# Patient Record
Sex: Male | Born: 1950 | Race: Black or African American | Hispanic: No | Marital: Single | Smoking: Light tobacco smoker
Health system: Southern US, Community
[De-identification: ages and names within clinical notes are randomized; demographics above are authoritative.]

## PROBLEM LIST (undated history)

## (undated) DIAGNOSIS — B1920 Unspecified viral hepatitis C without hepatic coma: Secondary | ICD-10-CM

## (undated) DIAGNOSIS — G8250 Quadriplegia, unspecified: Secondary | ICD-10-CM

## (undated) DIAGNOSIS — I499 Cardiac arrhythmia, unspecified: Secondary | ICD-10-CM

## (undated) DIAGNOSIS — I4892 Unspecified atrial flutter: Secondary | ICD-10-CM

## (undated) DIAGNOSIS — I10 Essential (primary) hypertension: Secondary | ICD-10-CM

## (undated) DIAGNOSIS — R569 Unspecified convulsions: Secondary | ICD-10-CM

## (undated) DIAGNOSIS — I509 Heart failure, unspecified: Secondary | ICD-10-CM

## (undated) DIAGNOSIS — F10239 Alcohol dependence with withdrawal, unspecified: Secondary | ICD-10-CM

## (undated) DIAGNOSIS — Z9114 Patient's other noncompliance with medication regimen: Secondary | ICD-10-CM

## (undated) DIAGNOSIS — F1010 Alcohol abuse, uncomplicated: Secondary | ICD-10-CM

## (undated) HISTORY — DX: Patient's other noncompliance with medication regimen: Z91.14

## (undated) HISTORY — DX: Unspecified atrial flutter: I48.92

## (undated) HISTORY — DX: Essential (primary) hypertension: I10

## (undated) HISTORY — PX: OTHER SURGICAL HISTORY: SHX169

## (undated) HISTORY — DX: Unspecified viral hepatitis C without hepatic coma: B19.20

## (undated) HISTORY — DX: Unspecified convulsions: R56.9

## (undated) HISTORY — DX: Alcohol dependence with withdrawal, unspecified: F10.239

---

## 1998-03-30 ENCOUNTER — Emergency Department (HOSPITAL_COMMUNITY): Admission: EM | Admit: 1998-03-30 | Discharge: 1998-03-30 | Payer: Self-pay | Admitting: Emergency Medicine

## 1998-05-17 ENCOUNTER — Emergency Department (HOSPITAL_COMMUNITY): Admission: EM | Admit: 1998-05-17 | Discharge: 1998-05-17 | Payer: Self-pay | Admitting: Emergency Medicine

## 1998-11-01 ENCOUNTER — Emergency Department (HOSPITAL_COMMUNITY): Admission: EM | Admit: 1998-11-01 | Discharge: 1998-11-01 | Payer: Self-pay | Admitting: Emergency Medicine

## 2001-04-01 ENCOUNTER — Emergency Department (HOSPITAL_COMMUNITY): Admission: EM | Admit: 2001-04-01 | Discharge: 2001-04-02 | Payer: Self-pay | Admitting: *Deleted

## 2001-04-01 ENCOUNTER — Encounter: Payer: Self-pay | Admitting: *Deleted

## 2001-04-02 ENCOUNTER — Encounter: Payer: Self-pay | Admitting: *Deleted

## 2001-07-16 ENCOUNTER — Encounter: Payer: Self-pay | Admitting: Emergency Medicine

## 2001-07-16 ENCOUNTER — Emergency Department (HOSPITAL_COMMUNITY): Admission: EM | Admit: 2001-07-16 | Discharge: 2001-07-16 | Payer: Self-pay | Admitting: Emergency Medicine

## 2002-02-18 ENCOUNTER — Emergency Department (HOSPITAL_COMMUNITY): Admission: EM | Admit: 2002-02-18 | Discharge: 2002-02-18 | Payer: Self-pay | Admitting: Emergency Medicine

## 2002-02-27 ENCOUNTER — Emergency Department (HOSPITAL_COMMUNITY): Admission: EM | Admit: 2002-02-27 | Discharge: 2002-02-27 | Payer: Self-pay | Admitting: Emergency Medicine

## 2002-12-09 ENCOUNTER — Emergency Department (HOSPITAL_COMMUNITY): Admission: EM | Admit: 2002-12-09 | Discharge: 2002-12-09 | Payer: Self-pay | Admitting: Emergency Medicine

## 2003-08-01 ENCOUNTER — Emergency Department (HOSPITAL_COMMUNITY): Admission: EM | Admit: 2003-08-01 | Discharge: 2003-08-01 | Payer: Self-pay | Admitting: Emergency Medicine

## 2003-08-01 ENCOUNTER — Encounter: Payer: Self-pay | Admitting: Emergency Medicine

## 2005-02-28 ENCOUNTER — Emergency Department (HOSPITAL_COMMUNITY): Admission: EM | Admit: 2005-02-28 | Discharge: 2005-03-01 | Payer: Self-pay | Admitting: Emergency Medicine

## 2007-01-23 ENCOUNTER — Emergency Department (HOSPITAL_COMMUNITY): Admission: EM | Admit: 2007-01-23 | Discharge: 2007-01-24 | Payer: Self-pay | Admitting: Emergency Medicine

## 2007-01-27 ENCOUNTER — Ambulatory Visit: Payer: Self-pay | Admitting: *Deleted

## 2007-09-16 ENCOUNTER — Inpatient Hospital Stay (HOSPITAL_COMMUNITY): Admission: EM | Admit: 2007-09-16 | Discharge: 2007-09-21 | Payer: Self-pay | Admitting: Emergency Medicine

## 2007-09-16 ENCOUNTER — Ambulatory Visit: Payer: Self-pay | Admitting: Internal Medicine

## 2008-06-15 ENCOUNTER — Emergency Department (HOSPITAL_COMMUNITY): Admission: EM | Admit: 2008-06-15 | Discharge: 2008-06-15 | Payer: Self-pay | Admitting: Emergency Medicine

## 2008-11-16 ENCOUNTER — Inpatient Hospital Stay (HOSPITAL_COMMUNITY): Admission: AC | Admit: 2008-11-16 | Discharge: 2008-11-18 | Payer: Self-pay

## 2008-11-25 ENCOUNTER — Ambulatory Visit: Payer: Self-pay | Admitting: Psychiatry

## 2008-11-25 ENCOUNTER — Inpatient Hospital Stay (HOSPITAL_COMMUNITY): Admission: EM | Admit: 2008-11-25 | Discharge: 2008-12-02 | Payer: Self-pay | Admitting: Emergency Medicine

## 2008-11-27 ENCOUNTER — Encounter (INDEPENDENT_AMBULATORY_CARE_PROVIDER_SITE_OTHER): Payer: Self-pay | Admitting: Internal Medicine

## 2009-02-27 ENCOUNTER — Emergency Department (HOSPITAL_COMMUNITY): Admission: EM | Admit: 2009-02-27 | Discharge: 2009-02-28 | Payer: Self-pay | Admitting: Emergency Medicine

## 2009-06-14 ENCOUNTER — Emergency Department (HOSPITAL_COMMUNITY): Admission: EM | Admit: 2009-06-14 | Discharge: 2009-06-14 | Payer: Self-pay | Admitting: Emergency Medicine

## 2009-07-12 ENCOUNTER — Emergency Department (HOSPITAL_COMMUNITY): Admission: EM | Admit: 2009-07-12 | Discharge: 2009-07-12 | Payer: Self-pay | Admitting: Emergency Medicine

## 2010-02-06 ENCOUNTER — Emergency Department (HOSPITAL_COMMUNITY): Admission: EM | Admit: 2010-02-06 | Discharge: 2010-02-06 | Payer: Self-pay | Admitting: Emergency Medicine

## 2010-10-24 ENCOUNTER — Emergency Department (HOSPITAL_COMMUNITY): Admission: EM | Admit: 2010-10-24 | Discharge: 2010-10-24 | Payer: Self-pay | Admitting: Emergency Medicine

## 2010-10-30 ENCOUNTER — Emergency Department (HOSPITAL_COMMUNITY): Admission: EM | Admit: 2010-10-30 | Discharge: 2010-10-30 | Payer: Self-pay | Admitting: Emergency Medicine

## 2011-03-22 LAB — COMPREHENSIVE METABOLIC PANEL
ALT: 154 U/L — ABNORMAL HIGH (ref 0–53)
AST: 280 U/L — ABNORMAL HIGH (ref 0–37)
Albumin: 4.1 g/dL (ref 3.5–5.2)
Alkaline Phosphatase: 89 U/L (ref 39–117)
Chloride: 104 mEq/L (ref 96–112)
GFR calc Af Amer: >60 mL/min (ref >60)
Potassium: 4.5 mEq/L (ref 3.5–5.1)
Total Bilirubin: 0.8 mg/dL (ref 0.3–1.2)

## 2011-03-22 LAB — POCT I-STAT, CHEM 8
Calcium, Ion: 1.07 mmol/L — ABNORMAL LOW (ref 1.12–1.32)
HCT: 47 % (ref 39.0–52.0)
Hemoglobin: 16 g/dL (ref 13.0–17.0)
Sodium: 144 mEq/L (ref 135–145)
TCO2: 21 mmol/L (ref 0–100)

## 2011-03-22 LAB — CBC
HCT: 43.3 % (ref 39.0–52.0)
Platelets: 205 10*3/uL (ref 150–400)
WBC: 7.3 10*3/uL (ref 4.0–10.5)

## 2011-03-22 LAB — DIFFERENTIAL
Band Neutrophils: 0 % (ref 0–10)
Basophils Absolute: 0 10*3/uL (ref 0.0–0.1)
Basophils Relative: 0 % (ref 0–1)
Lymphs Abs: 2.6 10*3/uL (ref 0.7–4.0)
Myelocytes: 0 %
Promyelocytes Absolute: 0 %

## 2011-03-22 LAB — ETHANOL
Alcohol, Ethyl (B): 287 mg/dL — ABNORMAL HIGH (ref 0–10)
Alcohol, Ethyl (B): 372 mg/dL — ABNORMAL HIGH (ref 0–10)

## 2011-03-22 LAB — GLUCOSE, CAPILLARY: Glucose-Capillary: 78 mg/dL (ref 70–99)

## 2011-03-26 LAB — BASIC METABOLIC PANEL
Chloride: 109 mEq/L (ref 96–112)
Creatinine, Ser: 1.02 mg/dL (ref 0.4–1.5)
GFR calc Af Amer: >60 mL/min (ref >60)
GFR calc non Af Amer: >60 mL/min (ref >60)

## 2011-03-26 LAB — DIFFERENTIAL
Eosinophils Absolute: 0.1 10*3/uL (ref 0.0–0.7)
Eosinophils Relative: 1 % (ref 0–5)
Lymphocytes Relative: 70 % — ABNORMAL HIGH (ref 12–46)
Lymphs Abs: 5 10*3/uL — ABNORMAL HIGH (ref 0.7–4.0)
Metamyelocytes Relative: 0 %
Monocytes Absolute: 0.3 10*3/uL (ref 0.1–1.0)
Monocytes Relative: 4 % (ref 3–12)
Neutro Abs: 1.8 10*3/uL (ref 1.7–7.7)
Neutrophils Relative %: 25 % — ABNORMAL LOW (ref 43–77)
nRBC: 0 /100 WBC

## 2011-03-26 LAB — ETHANOL: Alcohol, Ethyl (B): 458 mg/dL (ref 0–10)

## 2011-03-26 LAB — CBC
MCV: 78.8 fL (ref 78.0–100.0)
RBC: 5.53 MIL/uL (ref 4.22–5.81)
WBC: 7.2 10*3/uL (ref 4.0–10.5)

## 2011-03-26 LAB — RAPID URINE DRUG SCREEN, HOSP PERFORMED
Barbiturates: NOT DETECTED
Opiates: NOT DETECTED
Tetrahydrocannabinol: NOT DETECTED
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
## 2011-04-28 NOTE — Discharge Summary (Signed)
Anthony Mcclure, Anthony Mcclure                ACCOUNT NO.:  [id number]   MEDICAL RECORD NO.:  [id number]          PATIENT TYPE:  INP   LOCATION:  4731                         FACILITY:  [facility]   PHYSICIAN:  Richarda Overlie, MD       DATE OF BIRTH:  Jun 09, 1951   DATE OF ADMISSION:  11/25/2008  DATE OF DISCHARGE:  11/30/2008                               DISCHARGE SUMMARY   DISCHARGE DIAGNOSES:  1. New onset atrial flutter secondary to alcohol intoxication.  2. Hypertension.  3. Alcohol dependence.  4. History of tobacco abuse.  5. Delirium not otherwise specified.   SUBJECTIVE:  This is a 60 year old male admitted to [facility]  on December 13 with history of atrial fibrillation and alcohol  intoxication.  The patient was found to have a bizarre behavior and  impaired judgment.  Initially the patient was found to be tachycardic  with an EKG showing atrial flutter at a rate of 145 beats per minute.  His serial troponins remained negative.  Chest x-ray revealed  cardiomegaly with vascular congestion with no focal infiltrates.  CT  scan of the head revealed no acute disease.  CT scan of the cervical  spine revealed multilevel degenerative disk disease.  The patient's  atrial flutter new onset atrial flutter was thought to be secondary to  alcohol intoxication.  He was started on treatment with metoprolol.  No  anticoagulation was initiated because of his high fall risk.  The  patient's thyroid function studies were found to be within normal  limits.  His initial alcohol level was 287.  Urine drug screen was found  to be grossly negative.   Alcohol dependence with abnormal liver function thought to be secondary  to alcoholic hepatitis, AST greater than ALT.  He was found to have a  reactive anti-HCV antibody and HCV RIBA was sent which was still pending  at this time.  His hepatitis B and hepatitis A panel was negative.  The  HCV RIBA test needs to be followed closely by his primary care  Anthony Mcclure.   Elevated CK likely secondary to recurrent falls and rhabdomyolysis that  has responded well to IV hydration.   Hypokalemia.  He was found to be hypokalemic and his potassium was  repleted.   Anemia.  Anemia was thought to be due to anemia of chronic disease.  His  TIBC was found to be 237.  Serum iron was found to be 23.  His MCV was  found to be normal.  His anemia remained stable during the course of his  hospitalization.  He was started on a multivitamin and ferrous sulfate  supplementation.   Lack of competence.  Dr. Jeanie Mcclure was consulted who thought that he had  impaired [place]-term recall and impaired judgment with periodic  agitation.  Found to have delirium not otherwise specified, likely made  worse by thiamine deficiency.  Continuation of thiamine supplementation  was strongly recommended.  He was placed on benzodiazepine protocol for  alcohol withdrawal.  However, the patient did not have any signs of  withdrawal and his Ativan was discontinued  prior to discharge.   Because of his impaired judgment he was not allowed to leave against  medical advice and it was recommended to enroll Anthony Mcclure in a small  study group chemical dependence residential treatment program  . Dr  Anthony Mcclure felt that this would only be appropriate once his memory  returns .  Because of his inability to leave AMA as per psychitric assessment and  the fact that now he was medically stable a decision was made to  continue with his involunatry committment , until it was decided after  another Psychiatric assessment that this wasn't needed anymore.   If his involuntary committment is removed tomorrow morning then his DC  plan would be as follows.   Case manager was also to help with establishment of PCP.   DISCHARGE MEDICATIONS:  1. Aspirin 81 mg p.o. daily.  2. Lactulose 30 mL p.o. q.6 hours.  3. Toprol-XL 50 mg p.o. daily.  4. Ambien 1 tablet p.o. daily.  5. Prilosec 40 mg p.o.  daily.  6. K-Phos 250 mg p.o. q.12 hours.  7. Thiamine 100 mg p.o. daily.  8. Restoril 50 mg p.o. q.h.s. p.r.n. insomnia.  9. Maalox 30 mL p.o. q.12 hours p.r.n.  10.Ferrous sulfate 325 mg p.o. q.12 hours.   FOLLOWUP:  Repeat BMET, CT, CMP, prior to PCP visit and to have case  manager to establish PCP visit within 7-10 days.  PCP to follow up for  HCV RIBA test which is still pending.      Richarda Overlie, MD  Electronically Signed     NA/MEDQ  D:  11/30/2008  T:  11/30/2008  Job:  [id number]

## 2011-04-28 NOTE — Consult Note (Signed)
Mcclure, Anthony                ACCOUNT NO.:  [id number]   MEDICAL RECORD NO.:  [id number]          PATIENT TYPE:  INP   LOCATION:  4731                         FACILITY:  [facility]   PHYSICIAN:  Antonietta Breach, M.D.  DATE OF BIRTH:  [birth year]   DATE OF CONSULTATION:  11/29/2008  DATE OF DISCHARGE:                                 CONSULTATION   ADDENDUM:   Anthony Mcclure's decapacitating diagnosis is dementia, not otherwise  specified.  He has the impairments listed above in the first dictation  for December 17.      Antonietta Breach, M.D.  Electronically Signed     JW/MEDQ  D:  11/30/2008  T:  11/30/2008  Job:  [id number]

## 2011-04-28 NOTE — Discharge Summary (Signed)
NAMEHY, SWIATEK                ACCOUNT NO.:  [id number]   MEDICAL RECORD NO.:  [id number]          PATIENT TYPE:  INP   LOCATION:  5120                         FACILITY:  [facility]   PHYSICIAN:  Joylene John, MD       DATE OF BIRTH:  Nov 11, 1951   DATE OF ADMISSION:  11/25/2008  DATE OF DISCHARGE:  12/02/2008                               DISCHARGE SUMMARY   ADDENDUM   Please refer to the discharge summary that was dictated on November 30, 2008, for entire hospital course.   In the interim, the patient was kept in the hospital due to the fact  that he was not thought to have capacity to make decisions; however, he  was seen by a Psychiatry over the weekend and the patient was thought to  be competent to make medical decisions regarding his treatment care.  The patient was also seen by social worker to coordinate an outpatient  followup which was set up, and since the patient was medically stable  and hemodynamically stable, he was deemed stable for discharge.  The  patient was discharged on December 02, 2008.      Joylene John, MD  Electronically Signed     RP/MEDQ  D:  12/02/2008  T:  12/03/2008  Job:  [id number]
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
## 2011-09-10 LAB — CBC
HCT: 43.2
MCHC: 31.9
MCV: 83
Platelets: 180

## 2011-09-10 LAB — DIFFERENTIAL
Basophils Relative: 2 — ABNORMAL HIGH
Eosinophils Absolute: 0.1
Eosinophils Relative: 1
Neutrophils Relative %: 50

## 2011-09-10 LAB — POCT I-STAT, CHEM 8
Glucose, Bld: 80
HCT: 47
Hemoglobin: 16
Potassium: 3.6
Sodium: 140
TCO2: 24

## 2011-09-17 LAB — COMPREHENSIVE METABOLIC PANEL
AST: 167 U/L — ABNORMAL HIGH (ref 0–37)
AST: 243 U/L — ABNORMAL HIGH (ref 0–37)
Albumin: 3 g/dL — ABNORMAL LOW (ref 3.5–5.2)
Alkaline Phosphatase: 68 U/L (ref 39–117)
BUN: 4 mg/dL — ABNORMAL LOW (ref 6–23)
BUN: 5 mg/dL — ABNORMAL LOW (ref 6–23)
CO2: 26 mEq/L (ref 19–32)
CO2: 27 mEq/L (ref 19–32)
Calcium: 8.2 mg/dL — ABNORMAL LOW (ref 8.4–10.5)
Calcium: 8.3 mg/dL — ABNORMAL LOW (ref 8.4–10.5)
Calcium: 8.5 mg/dL (ref 8.4–10.5)
Chloride: 106 mEq/L (ref 96–112)
Creatinine, Ser: 0.85 mg/dL (ref 0.4–1.5)
Creatinine, Ser: 0.89 mg/dL (ref 0.4–1.5)
GFR calc Af Amer: >60 mL/min (ref >60)
GFR calc Af Amer: >60 mL/min (ref >60)
GFR calc non Af Amer: >60 mL/min (ref >60)
GFR calc non Af Amer: >60 mL/min (ref >60)
GFR calc non Af Amer: >60 mL/min (ref >60)
Glucose, Bld: 77 mg/dL (ref 70–99)
Total Bilirubin: 1.2 mg/dL (ref 0.3–1.2)
Total Protein: 5.5 g/dL — ABNORMAL LOW (ref 6.0–8.3)
Total Protein: 5.8 g/dL — ABNORMAL LOW (ref 6.0–8.3)

## 2011-09-17 LAB — HEPATIC FUNCTION PANEL
ALT: 158 U/L — ABNORMAL HIGH (ref 0–53)
AST: 206 U/L — ABNORMAL HIGH (ref 0–37)
AST: 295 U/L — ABNORMAL HIGH (ref 0–37)
AST: 334 U/L — ABNORMAL HIGH (ref 0–37)
Albumin: 2.8 g/dL — ABNORMAL LOW (ref 3.5–5.2)
Bilirubin, Direct: 0.2 mg/dL (ref 0.0–0.3)
Bilirubin, Direct: 0.2 mg/dL (ref 0.0–0.3)
Bilirubin, Direct: 0.2 mg/dL (ref 0.0–0.3)
Indirect Bilirubin: 0.4 mg/dL (ref 0.3–0.9)
Indirect Bilirubin: 0.7 mg/dL (ref 0.3–0.9)
Total Bilirubin: 0.6 mg/dL (ref 0.3–1.2)
Total Protein: 5.3 g/dL — ABNORMAL LOW (ref 6.0–8.3)
Total Protein: 5.3 g/dL — ABNORMAL LOW (ref 6.0–8.3)

## 2011-09-17 LAB — PROTIME-INR
INR: 1 (ref 0.00–1.49)
INR: 1 (ref 0.00–1.49)
Prothrombin Time: 13.5 seconds (ref 11.6–15.2)

## 2011-09-17 LAB — DIFFERENTIAL
Band Neutrophils: 0 % (ref 0–10)
Basophils Absolute: 0 10*3/uL (ref 0.0–0.1)
Basophils Relative: 0 % (ref 0–1)
Eosinophils Absolute: 0 10*3/uL (ref 0.0–0.7)
Eosinophils Relative: 0 % (ref 0–5)
Lymphocytes Relative: 23 % (ref 12–46)
Lymphs Abs: 1.4 10*3/uL (ref 0.7–4.0)
Monocytes Absolute: 0.3 10*3/uL (ref 0.1–1.0)
Neutro Abs: 4.5 10*3/uL (ref 1.7–7.7)
Neutrophils Relative %: 72 % (ref 43–77)
Promyelocytes Absolute: 0 %

## 2011-09-17 LAB — HEPATITIS B CORE ANTIBODY, TOTAL: Hep B Core Total Ab: NEGATIVE

## 2011-09-17 LAB — BASIC METABOLIC PANEL
BUN: 5 mg/dL — ABNORMAL LOW (ref 6–23)
CO2: 26 mEq/L (ref 19–32)
Calcium: 7.9 mg/dL — ABNORMAL LOW (ref 8.4–10.5)
Calcium: 8.2 mg/dL — ABNORMAL LOW (ref 8.4–10.5)
Calcium: 8.4 mg/dL (ref 8.4–10.5)
Chloride: 106 mEq/L (ref 96–112)
GFR calc Af Amer: >60 mL/min (ref >60)
GFR calc Af Amer: >60 mL/min (ref >60)
GFR calc non Af Amer: >60 mL/min (ref >60)
GFR calc non Af Amer: >60 mL/min (ref >60)
Glucose, Bld: 82 mg/dL (ref 70–99)
Glucose, Bld: 92 mg/dL (ref 70–99)
Potassium: 3.4 mEq/L — ABNORMAL LOW (ref 3.5–5.1)
Potassium: 4.1 mEq/L (ref 3.5–5.1)
Sodium: 137 mEq/L (ref 135–145)
Sodium: 137 mEq/L (ref 135–145)
Sodium: 141 mEq/L (ref 135–145)

## 2011-09-17 LAB — CBC
HCT: 28.7 % — ABNORMAL LOW (ref 39.0–52.0)
HCT: 28.8 % — ABNORMAL LOW (ref 39.0–52.0)
HCT: 29 % — ABNORMAL LOW (ref 39.0–52.0)
Hemoglobin: 9.5 g/dL — ABNORMAL LOW (ref 13.0–17.0)
Hemoglobin: 9.6 g/dL — ABNORMAL LOW (ref 13.0–17.0)
MCHC: 31.6 g/dL (ref 30.0–36.0)
MCHC: 33.1 g/dL (ref 30.0–36.0)
MCHC: 33.3 g/dL (ref 30.0–36.0)
MCV: 87.2 fL (ref 78.0–100.0)
MCV: 87.5 fL (ref 78.0–100.0)
Platelets: 208 10*3/uL (ref 150–400)
RBC: 3.33 MIL/uL — ABNORMAL LOW (ref 4.22–5.81)
RBC: 3.34 MIL/uL — ABNORMAL LOW (ref 4.22–5.81)
RBC: 3.37 MIL/uL — ABNORMAL LOW (ref 4.22–5.81)
RDW: 18.6 % — ABNORMAL HIGH (ref 11.5–15.5)
RDW: 19.1 % — ABNORMAL HIGH (ref 11.5–15.5)
RDW: 19.4 % — ABNORMAL HIGH (ref 11.5–15.5)
WBC: 6.6 10*3/uL (ref 4.0–10.5)

## 2011-09-17 LAB — IRON AND TIBC
Iron: 23 ug/dL — ABNORMAL LOW (ref 42–135)
Saturation Ratios: 8 % — ABNORMAL LOW (ref 20–55)
UIBC: 237 ug/dL
UIBC: 254 ug/dL

## 2011-09-17 LAB — HEPATITIS B SURFACE ANTIGEN: Hepatitis B Surface Ag: NEGATIVE

## 2011-09-17 LAB — APTT: aPTT: 34 seconds (ref 24–37)

## 2011-09-17 LAB — AMMONIA: Ammonia: 81 umol/L — ABNORMAL HIGH (ref 11–35)

## 2011-09-17 LAB — CARDIAC PANEL(CRET KIN+CKTOT+MB+TROPI)
Total CK: 554 U/L — ABNORMAL HIGH (ref 7–232)
Troponin I: 0.02 ng/mL (ref 0.00–0.06)

## 2011-09-17 LAB — POCT CARDIAC MARKERS: CKMB, poc: 4.8 ng/mL (ref 1.0–8.0)

## 2011-09-17 LAB — CK TOTAL AND CKMB (NOT AT ARMC)
CK, MB: 7.4 ng/mL — ABNORMAL HIGH (ref 0.3–4.0)
Relative Index: 0.9 (ref 0.0–2.5)
Total CK: 822 U/L — ABNORMAL HIGH (ref 7–232)

## 2011-09-17 LAB — RAPID URINE DRUG SCREEN, HOSP PERFORMED
Cocaine: NOT DETECTED
Opiates: NOT DETECTED

## 2011-09-17 LAB — PHOSPHORUS: Phosphorus: 3.9 mg/dL (ref 2.3–4.6)

## 2011-09-17 LAB — VITAMIN B12: Vitamin B-12: 616 pg/mL (ref 211–911)

## 2011-09-17 LAB — HEPATITIS PANEL, ACUTE
HCV Ab: REACTIVE — AB
Hep B C IgM: NEGATIVE
Hepatitis B Surface Ag: NEGATIVE

## 2011-09-17 LAB — HEPATITIS C ANTIBODY: HCV Ab: REACTIVE — AB

## 2011-09-17 LAB — HCV RNA QUANT RFLX ULTRA OR GENOTYP
HCV Quantitative Log: 5.91 {Log} — ABNORMAL HIGH (ref <1.63)
HCV Quantitative: 806000 IU/mL — ABNORMAL HIGH (ref <43)

## 2011-09-17 LAB — ETHANOL: Alcohol, Ethyl (B): 358 mg/dL — ABNORMAL HIGH (ref 0–10)

## 2011-09-17 LAB — TROPONIN I: Troponin I: 0.05 ng/mL (ref 0.00–0.06)

## 2011-09-17 LAB — ANA: Anti Nuclear Antibody(ANA): NEGATIVE

## 2011-09-17 LAB — MAGNESIUM: Magnesium: 1.5 mg/dL (ref 1.5–2.5)

## 2011-09-17 LAB — TSH: TSH: 1.276 u[IU]/mL (ref 0.350–4.500)

## 2011-09-18 LAB — CBC
HCT: 28 % — ABNORMAL LOW (ref 39.0–52.0)
Hemoglobin: 9.4 g/dL — ABNORMAL LOW (ref 13.0–17.0)
MCHC: 32.8 g/dL (ref 30.0–36.0)
MCHC: 33.6 g/dL (ref 30.0–36.0)
MCV: 84.4 fL (ref 78.0–100.0)
MCV: 85.1 fL (ref 78.0–100.0)
Platelets: 109 K/uL — ABNORMAL LOW (ref 150–400)
Platelets: 158 10*3/uL (ref 150–400)
RBC: 2.92 MIL/uL — ABNORMAL LOW (ref 4.22–5.81)
RBC: 3.29 MIL/uL — ABNORMAL LOW (ref 4.22–5.81)
RDW: 17.3 % — ABNORMAL HIGH (ref 11.5–15.5)
WBC: 6.6 10*3/uL (ref 4.0–10.5)
WBC: 7.8 10*3/uL (ref 4.0–10.5)
WBC: 9.1 K/uL (ref 4.0–10.5)

## 2011-09-18 LAB — TYPE AND SCREEN: Antibody Screen: NEGATIVE

## 2011-09-18 LAB — ABO/RH: ABO/RH(D): O POS

## 2011-09-18 LAB — BASIC METABOLIC PANEL
BUN: 6 mg/dL (ref 6–23)
CO2: 21 mEq/L (ref 19–32)
Calcium: 7.4 mg/dL — ABNORMAL LOW (ref 8.4–10.5)
Creatinine, Ser: 0.79 mg/dL (ref 0.4–1.5)
Glucose, Bld: 84 mg/dL (ref 70–99)

## 2011-09-18 LAB — POCT I-STAT, CHEM 8
Creatinine, Ser: 1.2 mg/dL (ref 0.4–1.5)
Glucose, Bld: 194 mg/dL — ABNORMAL HIGH (ref 70–99)
Hemoglobin: 14.3 g/dL (ref 13.0–17.0)
Sodium: 138 meq/L (ref 135–145)
TCO2: 22 mmol/L (ref 0–100)

## 2011-09-18 LAB — PROTIME-INR
INR: 1 (ref 0.00–1.49)
Prothrombin Time: 13.8 s (ref 11.6–15.2)

## 2011-09-18 LAB — PHENYTOIN LEVEL, TOTAL: Phenytoin Lvl: <2.5 ug/mL — ABNORMAL LOW (ref 10.0–20.0)

## 2011-09-18 LAB — ETHANOL: Alcohol, Ethyl (B): 393 mg/dL — ABNORMAL HIGH (ref 0–10)

## 2011-09-24 LAB — CULTURE, BLOOD (ROUTINE X 2)
Culture: NO GROWTH
Culture: NO GROWTH

## 2011-09-24 LAB — DIFFERENTIAL
Basophils Absolute: 0
Eosinophils Relative: 5
Lymphocytes Relative: 32
Neutrophils Relative %: 58

## 2011-09-24 LAB — I-STAT 8, (EC8 V) (CONVERTED LAB)
Acid-base deficit: 2
BUN: 8
Chloride: 107
HCT: 48
Hemoglobin: 16.3
Operator id: 277751
Potassium: 3.8
Sodium: 144

## 2011-09-24 LAB — RAPID URINE DRUG SCREEN, HOSP PERFORMED
Benzodiazepines: POSITIVE — AB
Cocaine: NOT DETECTED
Tetrahydrocannabinol: NOT DETECTED

## 2011-09-24 LAB — BASIC METABOLIC PANEL
BUN: 1 — ABNORMAL LOW
BUN: 3 — ABNORMAL LOW
BUN: 7
CO2: 26
CO2: 27
Calcium: 8.4
Calcium: 8.9
Chloride: 105
Chloride: 105
Chloride: 106
Creatinine, Ser: 0.88
Creatinine, Ser: 0.92
GFR calc non Af Amer: >60
GFR calc non Af Amer: >60
Glucose, Bld: 105 — ABNORMAL HIGH
Glucose, Bld: 77
Glucose, Bld: 95
Glucose, Bld: 99
Potassium: 3.5
Potassium: 3.5
Potassium: 3.7
Potassium: 3.7
Sodium: 138
Sodium: 139

## 2011-09-24 LAB — URINALYSIS, ROUTINE W REFLEX MICROSCOPIC
Nitrite: NEGATIVE
Specific Gravity, Urine: 1.019
Urobilinogen, UA: 0.2
pH: 5.5

## 2011-09-24 LAB — COMPREHENSIVE METABOLIC PANEL
AST: 96 — ABNORMAL HIGH
Albumin: 3.3 — ABNORMAL LOW
Calcium: 8.8
Creatinine, Ser: 0.85
GFR calc Af Amer: >60
GFR calc non Af Amer: >60
Total Protein: 6.6

## 2011-09-24 LAB — ETHANOL
Alcohol, Ethyl (B): 442
Alcohol, Ethyl (B): 570
Alcohol, Ethyl (B): <5

## 2011-09-24 LAB — CBC
HCT: 42.9
MCHC: 33.2
MCV: 81.3
Platelets: 264
Platelets: 304
RDW: 17.7 — ABNORMAL HIGH
RDW: 17.9 — ABNORMAL HIGH

## 2011-09-24 LAB — MAGNESIUM: Magnesium: 1.5

## 2011-09-24 LAB — URINE MICROSCOPIC-ADD ON

## 2011-09-24 LAB — POCT I-STAT CREATININE: Operator id: 277751

## 2011-09-24 LAB — AMMONIA: Ammonia: 48 — ABNORMAL HIGH

## 2012-02-01 ENCOUNTER — Other Ambulatory Visit: Payer: Self-pay

## 2012-02-01 ENCOUNTER — Inpatient Hospital Stay (HOSPITAL_COMMUNITY)
Admission: EM | Admit: 2012-02-01 | Discharge: 2012-02-09 | DRG: 309 | Disposition: A | Discharge status: Home / Self Care | Payer: Medicare Other | Attending: Internal Medicine | Admitting: Internal Medicine

## 2012-02-01 ENCOUNTER — Encounter (HOSPITAL_COMMUNITY): Payer: Self-pay | Admitting: *Deleted

## 2012-02-01 ENCOUNTER — Emergency Department (HOSPITAL_COMMUNITY): Payer: Medicare Other

## 2012-02-01 DIAGNOSIS — I4892 Unspecified atrial flutter: Secondary | ICD-10-CM | POA: Diagnosis present

## 2012-02-01 DIAGNOSIS — M6282 Rhabdomyolysis: Secondary | ICD-10-CM | POA: Diagnosis present

## 2012-02-01 DIAGNOSIS — F1010 Alcohol abuse, uncomplicated: Secondary | ICD-10-CM | POA: Diagnosis present

## 2012-02-01 DIAGNOSIS — Z91148 Patient's other noncompliance with medication regimen for other reason: Secondary | ICD-10-CM

## 2012-02-01 DIAGNOSIS — I517 Cardiomegaly: Secondary | ICD-10-CM | POA: Diagnosis present

## 2012-02-01 DIAGNOSIS — E860 Dehydration: Secondary | ICD-10-CM | POA: Diagnosis present

## 2012-02-01 DIAGNOSIS — I959 Hypotension, unspecified: Secondary | ICD-10-CM | POA: Diagnosis present

## 2012-02-01 DIAGNOSIS — N179 Acute kidney failure, unspecified: Secondary | ICD-10-CM | POA: Diagnosis present

## 2012-02-01 DIAGNOSIS — I4891 Unspecified atrial fibrillation: Principal | ICD-10-CM | POA: Diagnosis present

## 2012-02-01 DIAGNOSIS — I10 Essential (primary) hypertension: Secondary | ICD-10-CM | POA: Diagnosis present

## 2012-02-01 DIAGNOSIS — T463X5A Adverse effect of coronary vasodilators, initial encounter: Secondary | ICD-10-CM | POA: Diagnosis present

## 2012-02-01 DIAGNOSIS — N390 Urinary tract infection, site not specified: Secondary | ICD-10-CM | POA: Diagnosis present

## 2012-02-01 DIAGNOSIS — Z91199 Patient's noncompliance with other medical treatment and regimen due to unspecified reason: Secondary | ICD-10-CM

## 2012-02-01 DIAGNOSIS — Z9119 Patient's noncompliance with other medical treatment and regimen: Secondary | ICD-10-CM

## 2012-02-01 DIAGNOSIS — F10239 Alcohol dependence with withdrawal, unspecified: Secondary | ICD-10-CM | POA: Diagnosis present

## 2012-02-01 DIAGNOSIS — Z9114 Patient's other noncompliance with medication regimen: Secondary | ICD-10-CM

## 2012-02-01 DIAGNOSIS — I9589 Other hypotension: Secondary | ICD-10-CM | POA: Diagnosis present

## 2012-02-01 DIAGNOSIS — F10939 Alcohol use, unspecified with withdrawal, unspecified: Secondary | ICD-10-CM | POA: Diagnosis present

## 2012-02-01 DIAGNOSIS — Z23 Encounter for immunization: Secondary | ICD-10-CM

## 2012-02-01 DIAGNOSIS — B1920 Unspecified viral hepatitis C without hepatic coma: Secondary | ICD-10-CM | POA: Diagnosis present

## 2012-02-01 DIAGNOSIS — E869 Volume depletion, unspecified: Secondary | ICD-10-CM | POA: Diagnosis present

## 2012-02-01 HISTORY — DX: Heart failure, unspecified: I50.9

## 2012-02-01 HISTORY — DX: Essential (primary) hypertension: I10

## 2012-02-01 HISTORY — DX: Alcohol abuse, uncomplicated: F10.10

## 2012-02-01 HISTORY — DX: Unspecified atrial flutter: I48.92

## 2012-02-01 HISTORY — DX: Unspecified convulsions: R56.9

## 2012-02-01 HISTORY — DX: Cardiac arrhythmia, unspecified: I49.9

## 2012-02-01 LAB — CARDIAC PANEL(CRET KIN+CKTOT+MB+TROPI)
Total CK: 1042 U/L — ABNORMAL HIGH (ref 7–232)
Troponin I: <0.3 ng/mL (ref <0.30)

## 2012-02-01 LAB — PROTIME-INR
INR: 0.99 (ref 0.00–1.49)
Prothrombin Time: 13.3 seconds (ref 11.6–15.2)

## 2012-02-01 LAB — URINALYSIS, ROUTINE W REFLEX MICROSCOPIC
Ketones, ur: 15 mg/dL — AB
Nitrite: NEGATIVE
Specific Gravity, Urine: 1.019 (ref 1.005–1.030)
Urobilinogen, UA: 4 mg/dL — ABNORMAL HIGH (ref 0.0–1.0)

## 2012-02-01 LAB — BASIC METABOLIC PANEL
BUN: 17 mg/dL (ref 6–23)
Chloride: 93 mEq/L — ABNORMAL LOW (ref 96–112)
GFR calc Af Amer: 52 mL/min — ABNORMAL LOW (ref >90)
GFR calc non Af Amer: 45 mL/min — ABNORMAL LOW (ref >90)
Potassium: 4.1 mEq/L (ref 3.5–5.1)

## 2012-02-01 LAB — CBC
HCT: 45.9 % (ref 39.0–52.0)
MCHC: 34.2 g/dL (ref 30.0–36.0)
Platelets: 143 10*3/uL — ABNORMAL LOW (ref 150–400)
RDW: 17.5 % — ABNORMAL HIGH (ref 11.5–15.5)
WBC: 11 10*3/uL — ABNORMAL HIGH (ref 4.0–10.5)

## 2012-02-01 LAB — URINE MICROSCOPIC-ADD ON

## 2012-02-01 LAB — APTT: aPTT: 32 seconds (ref 24–37)

## 2012-02-01 LAB — RAPID URINE DRUG SCREEN, HOSP PERFORMED: Benzodiazepines: NOT DETECTED

## 2012-02-01 MED ORDER — LORAZEPAM 2 MG/ML IJ SOLN
1.0000 mg | Freq: Once | INTRAMUSCULAR | Status: AC
  Administered 2012-02-01: 1 mg via INTRAVENOUS
  Filled 2012-02-01: qty 1

## 2012-02-01 MED ORDER — DILTIAZEM HCL 100 MG IV SOLR
5.0000 mg/h | INTRAVENOUS | Status: DC
  Administered 2012-02-01: 10 mg/h via INTRAVENOUS
  Filled 2012-02-01: qty 100

## 2012-02-01 MED ORDER — SODIUM CHLORIDE 0.9 % IV BOLUS (SEPSIS)
1000.0000 mL | Freq: Once | INTRAVENOUS | Status: AC
  Administered 2012-02-01: 1000 mL via INTRAVENOUS

## 2012-02-01 MED ORDER — ADENOSINE 6 MG/2ML IV SOLN
INTRAVENOUS | Status: AC
  Filled 2012-02-01: qty 2

## 2012-02-01 MED ORDER — DILTIAZEM HCL 50 MG/10ML IV SOLN
10.0000 mg | Freq: Once | INTRAVENOUS | Status: AC
  Administered 2012-02-01: 10 mg via INTRAVENOUS
  Filled 2012-02-01: qty 2

## 2012-02-01 MED ORDER — DILTIAZEM HCL 25 MG/5ML IV SOLN
INTRAVENOUS | Status: AC
  Administered 2012-02-01: 10 mg
  Filled 2012-02-01: qty 5

## 2012-02-01 MED ORDER — THIAMINE HCL 100 MG/ML IJ SOLN
INTRAVENOUS | Status: AC
  Administered 2012-02-01: 22:00:00 via INTRAVENOUS
  Filled 2012-02-01 (×2): qty 1000

## 2012-02-01 NOTE — ED Provider Notes (Signed)
History     CSN: [id number]  Arrival date & time 02/01/12  1908   First MD Initiated Contact with Patient 02/01/12 1925      Chief Complaint  Patient presents with  . Dizziness  . Chest Pain  . Shortness of Breath    (Consider location/radiation/quality/duration/timing/severity/associated sxs/prior treatment) HPI A LEVEL 5 CAVEAT PERTAINS DUE TO URGENT NEED FOR INTERVENTION Pt presents to the ED after reported episode of syncope and chest pain.  Per EMS HR was in the 130s and 140s on transport.  Pt currently denying chest pain.  HR upon initial ED evaluation was 240s.  Pt also states he drinks alcohol daily.  He denies any other substance use.  He denies any recent illness. Of note, pt is a poor historian.  He does state he is supposed to be taking dilantin for a seizure disorder, but has not taken this in years  Past Medical History  Diagnosis Date  . Hypertension   . CHF (congestive heart failure)   . Alcohol abuse   . Dysrhythmia     paroxismal atrial fib  . Seizure     History reviewed. No pertinent past surgical history.  Family History  Problem Relation Age of Onset  . Heart disease Father   . Alcohol abuse Father   . Alcohol abuse Brother     History  Substance Use Topics  . Smoking status: Former Games developer  . Smokeless tobacco: Not on file  . Alcohol Use: Yes     statrts drinking everyday at 5am drinks about 2 fiths been drinking for about 10 years      Review of Systems UNABLE TO OBTAIN ROS DUE TO LEVEL 5 CAVEAT  Allergies  Review of patient's allergies indicates no known allergies.  Home Medications  No current outpatient prescriptions on file.  BP 94/41  Pulse 145  Temp(Src) 98.3 F (36.8 C) (Oral)  Resp 18  Ht 6\' 3"  (1.905 m)  Wt 196 lb 6.9 oz (89.1 kg)  BMI 24.55 kg/m2  SpO2 97% Vitals reviewed Physical Exam Physical Examination: General appearance - alert, ill appearing, and in mild distress Mental status - alert, oriented to person,  place, and time Eyes - pupils equal and reactive, extraocular eye movements intact, + conjunctival injection Mouth - mucous membranes moist, pharynx normal without lesions Chest - clear to auscultation, no wheezes, rales or rhonchi, symmetric air entry Heart - normal rate, regular rhythm, normal S1, S2, no murmurs, rubs, clicks or gallops Abdomen - soft, nontender, nondistended, no masses or organomegaly Musculoskeletal - no joint tenderness, deformity or swelling Extremities - peripheral pulses normal, no pedal edema, no clubbing or cyanosis Skin - normal coloration and turgor, no rashes  ED Course  Procedures (including critical care time)   Date: 02/01/2012  19:15  Rate: 227  Rhythm: supraventricular tachycardia (SVT)  QRS Axis: normal  Intervals: indeterminate  ST/T Wave abnormalities: nonspecific ST/T changes  Conduction Disutrbances:SVT  Narrative Interpretation:   Old EKG Reviewed: changes noted SVT new compared to prior of 12/02/08   Date: 02/01/2012 19:17  Rate:148  Rhythm: atrial flutter with irregular rate  QRS Axis: normal  Intervals: indeterminate  ST/T Wave abnormalities: nonspecific ST/T changes  Conduction Disutrbances:atrial flutter  Narrative Interpretation:   Old EKG Reviewed: changes noted, rate decreased from prior moments ago in ED  10:57 PM d/w [facility] for admission.  Pt's HR is now in the 90's.  Decreasing diltiazem drip and will continue to titrate.  Pt to be admitted  to stepdown.    CRITICAL CARE Performed by: Ethelda Chick   Total critical care time: 50  Critical care time was exclusive of separately billable procedures and treating other patients.  Critical care was necessary to treat or prevent imminent or life-threatening deterioration.  Critical care was time spent personally by me on the following activities: development of treatment plan with patient and/or surrogate as well as nursing, discussions with consultants, evaluation of  patient's response to treatment, examination of patient, obtaining history from patient or surrogate, ordering and performing treatments and interventions, ordering and review of laboratory studies, ordering and review of radiographic studies, pulse oximetry and re-evaluation of patient's condition.  Labs Reviewed  CBC - Abnormal; Notable for the following:    WBC 11.0 (*)    RDW 17.5 (*)    Platelets 143 (*)    All other components within normal limits  BASIC METABOLIC PANEL - Abnormal; Notable for the following:    Chloride 93 (*)    Glucose, Bld 146 (*)    Creatinine, Ser 1.59 (*)    GFR calc non Af Amer 45 (*)    GFR calc Af Amer 52 (*)    All other components within normal limits  CARDIAC PANEL(CRET KIN+CKTOT+MB+TROPI) - Abnormal; Notable for the following:    Total CK 1042 (*)    CK, MB 16.4 (*)    All other components within normal limits  URINALYSIS, ROUTINE W REFLEX MICROSCOPIC - Abnormal; Notable for the following:    Color, Urine ORANGE (*) BIOCHEMICALS MAY BE AFFECTED BY COLOR   APPearance CLOUDY (*)    Bilirubin Urine SMALL (*)    Ketones, ur 15 (*)    Protein, ur 30 (*)    Urobilinogen, UA 4.0 (*)    Leukocytes, UA SMALL (*)    All other components within normal limits  URINE MICROSCOPIC-ADD ON - Abnormal; Notable for the following:    Casts HYALINE CASTS (*)    All other components within normal limits  COMPREHENSIVE METABOLIC PANEL - Abnormal; Notable for the following:    Albumin 3.0 (*)    AST 120 (*)    ALT 81 (*)    Total Bilirubin 1.4 (*)    GFR calc non Af Amer 77 (*)    GFR calc Af Amer 90 (*)    All other components within normal limits  CBC - Abnormal; Notable for the following:    RDW 17.3 (*)    Platelets 114 (*) PLATELET COUNT CONFIRMED BY SMEAR   All other components within normal limits  CARDIAC PANEL(CRET KIN+CKTOT+MB+TROPI) - Abnormal; Notable for the following:    Total CK 767 (*)    All other components within normal limits  CARDIAC  PANEL(CRET KIN+CKTOT+MB+TROPI) - Abnormal; Notable for the following:    Total CK 632 (*)    CK, MB 9.2 (*) CRITICAL VALUE NOTED.  VALUE IS CONSISTENT WITH PREVIOUSLY REPORTED AND CALLED VALUE.   All other components within normal limits  CARDIAC PANEL(CRET KIN+CKTOT+MB+TROPI) - Abnormal; Notable for the following:    Total CK 686 (*)    CK, MB 9.4 (*) CRITICAL VALUE NOTED.  VALUE IS CONSISTENT WITH PREVIOUSLY REPORTED AND CALLED VALUE.   All other components within normal limits  ETHANOL  URINE RAPID DRUG SCREEN (HOSP PERFORMED)  PROTIME-INR  APTT  MAGNESIUM  PHOSPHORUS  TSH  URINE CULTURE  MRSA PCR SCREENING   Dg Chest Port 1 View  02/01/2012  *RADIOLOGY REPORT*  Clinical Data: Chest pain.  Tachycardia.  Hypertension.  Alcohol abuse.  PORTABLE CHEST - 1 VIEW  Comparison: 06/14/2009.  Findings: Mildly progressive enlargement of the cardiac silhouette. Clear lungs with normal vascularity.  Thoracic spine degenerative changes.  IMPRESSION: Mildly progressive cardiomegaly.  No acute abnormality.  Original Report Authenticated By: Darrol Angel, M.D.     1. Atrial fibrillation with RVR       MDM  The patient presents with elevated heart rate in association with syncope and chest pain. Arrival patient was in SVT. Second IV was started patient placed on monitor and normal saline bolus initiated. Adenosine was prepared for demonstration. Prior to giving the adenosine his heart rate decreased into the 130s. Repeat EKG showed atrial flutter with irregular rate. Patient was started on a diltiazem drip and IV hydration was given. His alcohol level is low. He was also given Ativan due to concern for alcohol withdrawal as he is a daily heavy drinker. Multiple rechecks showed his heart rate to be remaining in the 120s and 130s and diltiazem drip was adjusted. At the time of admission patient's heart rate had decreased into the 90s and diltiazem drip was decreased. I've discussed the patient with try  hospitalist tube has seen the patient in the emergency department and he will be admitted to stepdown for further evaluation and management.        Ethelda Chick, MD 02/03/12 1355

## 2012-02-01 NOTE — ED Notes (Signed)
Patient does not know the name of his meds.  States that his MD died and has not been able to get his meds back on track

## 2012-02-01 NOTE — ED Notes (Signed)
NEW EKG DONE BY EMT R Leonila Speranza

## 2012-02-01 NOTE — H&P (Signed)
PCP:  none   Chief Complaint:   Dizzy, chest pain  HPI: Anthony Mcclure is a 61 y.o. male   has a past medical history of Hypertension and CHF (congestive heart failure).   Presented with chest pain, headache and presyncope to ER was found to have severe tachycardia.  likely a.fib/flutter with HR in 200's spontaneously improved to 140's patient was started on Diltiazem gtt with improvement of his heart rate to 90's He reports daily use of EtOH, and is not taking his metoprolol. Has prior admittions for the same in 2009 back then it was thought his atrial flutter was induced by alcohol. Echo from 2009 showed normal EF no clot or wall motion abnormalities. He is not a candidate for coumadin given EtOH abuse.  States he drinks 2 fiths a day every day for 10 years, had hx of seizures due to DT's.   Review of Systems:    Pertinent positives include:chest pain, sweating  Constitutional:  No weight loss, night sweats, Fevers, chills, fatigue.  HEENT:  No headaches, Difficulty swallowing,Tooth/dental problems,Sore throat,  No sneezing, itching, ear ache, nasal congestion, post nasal drip,  Cardio-vascular:  No  Orthopnea, PND, anasarca, dizziness, palpitations.no Bilateral lower extremity swelling  GI:  No heartburn, indigestion, abdominal pain, nausea, vomiting, diarrhea, change in bowel habits, loss of appetite, melena, blood in stool, hematoemesis Resp:  no shortness of breath at rest. No dyspnea on exertion, No excess mucus, no productive cough, No non-productive cough, No coughing up of blood.No change in color of mucus.No wheezing.No chest wall deformity  Skin:  no rash or lesions.  GU:  no dysuria, change in color of urine, no urgency or frequency. No flank pain.  Musculoskeletal:  No joint pain or swelling. No decreased range of motion. No back pain.  Psych:  No change in mood or affect. No depression or anxiety. No memory loss.  Neuro: no localizing neurological complaints,  no tingling, no weakness, no double vision, no gait abnormality, no slurred speech   Otherwise ROS are negative except for above, 10 systems were reviewed  Past Medical History: Past Medical History  Diagnosis Date  . Hypertension   . CHF (congestive heart failure)    History reviewed. No pertinent past surgical history.   Medications: Prior to Admission medications   Not on File    Allergies:  No Known Allergies  Social History:  Ambulatory independently Lives at house   does not have a smoking history on file. He does not have any smokeless tobacco history on file. He reports that he drinks alcohol.   Family History: family history is not on file.    Physical Exam: Patient Vitals for the past 24 hrs:  BP Temp Temp src Pulse Resp SpO2  02/01/12 2245 114/92 mmHg - - 45  23  100 %  02/01/12 2230 105/68 mmHg - - 47  12  99 %  02/01/12 2215 111/73 mmHg - - 38  16  100 %  02/01/12 2200 113/80 mmHg - - 72  13  100 %  02/01/12 2145 107/74 mmHg - - 80  16  100 %  02/01/12 2130 112/77 mmHg - - 76  - 99 %  02/01/12 2115 129/88 mmHg - - 63  - 100 %  02/01/12 2100 129/91 mmHg - - 47  13  100 %  02/01/12 2058 - 98.1 F (36.7 C) Oral - - -  02/01/12 2045 126/114 mmHg - - 140  18  98 %  02/01/12 2030 114/95 mmHg - - 72  18  98 %  02/01/12 2015 119/91 mmHg - - 104  14  100 %  02/01/12 2000 112/90 mmHg - - 132  18  99 %  02/01/12 1945 115/86 mmHg - - 71  17  100 %  02/01/12 1930 131/103 mmHg - - 135  18  100 %    1. General:  in No Acute distress 2. Psychological: Alert and Oriented to self not sittuation 3. Head/ENT:    Dry Mucous Membranes                          Head Non traumatic, neck supple                           Poor Dentition 4. SKIN: normal  Skin turgor,  Skin clean Dry and intact no rash 5. Heart: irregular rate and rhythm, rapid no Murmur, Rub or gallop 6. Lungs: Clear to auscultation bilaterally, no wheezes or crackles   7. Abdomen: Soft, non-tender, Non  distended 8. Lower extremities: no clubbing, cyanosis, or edema 9. Neurologically Grossly intact, moving all 4 extremities equally 10. MSK: Normal range of motion  body mass index is unknown because there is no height or weight on file.   Labs on Admission:   [facility] 02/01/12 1925  NA 135  K 4.1  CL 93*  CO2 30  GLUCOSE 146*  BUN 17  CREATININE 1.59*  CALCIUM 9.9  MG --  PHOS --   No results found for this basename: AST:2,ALT:2,ALKPHOS:2,BILITOT:2,PROT:2,ALBUMIN:2 in the last 72 hours No results found for this basename: LIPASE:2,AMYLASE:2 in the last 72 hours  Basename 02/01/12 1925  WBC 11.0*  NEUTROABS --  HGB 15.7  HCT 45.9  MCV 79.5  PLT 143*    Basename 02/01/12 2003  CKTOTAL 1042*  CKMB 16.4*  CKMBINDEX --  TROPONINI <0.30   No results found for this basename: TSH,T4TOTAL,FREET3,T3FREE,THYROIDAB in the last 72 hours No results found for this basename: VITAMINB12:2,FOLATE:2,FERRITIN:2,TIBC:2,IRON:2,RETICCTPCT:2 in the last 72 hours No results found for this basename: HGBA1C    CrCl is unknown because there is no height on file for the current visit. ABG    Component Value Date/Time   HCO3 26.1* 09/15/2007 2240   TCO2 21 07/12/2009 1845   ACIDBASEDEF 2.0 09/15/2007 2240     No results found for this basename: DDIMER     Other results:  I have pearsonaly reviewed this: ECG REPORT  Rate:227  Rhythm: SVT ST&T Change: N/A  Currently on the monitor in a.flutter HR in 100's  UA 7-10 WBC, rare bacteria mild infection?  Utox neg  Cultures:    Component Value Date/Time   SDES BLOOD RIGHT HAND 09/16/2007 1235   SPECREQUEST BOTTLES DRAWN AEROBIC AND ANAEROBIC 5CC 09/16/2007 1235   CULT NO GROWTH 5 DAYS 09/16/2007 1235   REPTSTATUS 09/22/2007 FINAL 09/16/2007 1235       Radiological Exams on Admission: Dg Chest Port 1 View  02/01/2012  *RADIOLOGY REPORT*  Clinical Data: Chest pain.  Tachycardia.  Hypertension.  Alcohol abuse.  PORTABLE CHEST - 1  VIEW  Comparison: 06/14/2009.  Findings: Mildly progressive enlargement of the cardiac silhouette. Clear lungs with normal vascularity.  Thoracic spine degenerative changes.  IMPRESSION: Mildly progressive cardiomegaly.  No acute abnormality.  Original Report Authenticated By: Darrol Angel, M.D.    Assessment/Plan  61 yo w hx of severe  alcohol abuse he presents with SVT now in atrial flutter on diltiazem drip with some improvement his history of delirious tremens being admitted to step down  Present on Admission:  .Alcohol abuse - admit to stepdown patient is at risk for withdrawal, start on C IWA  .Atrial fibrillation with RVR - continue diltiazem drip obtain echo gram check TSH this is likely secondary to alcohol  and dehydration, give IV fluids .SVT (supraventricular tachycardia) - self resolved patient in the past have been on beta blockers but is noncompliant and not taking any of his medications will obtain 2-D echo check TSH, no cocaine and on U. tox  .Acute renal failure -  give IV fluids followup creatinine if no improvement will obtain renal ultrasound and urine lites  .Hypotension - transient likely secondary to diltiazem dosing/A. fib will continue medically fluids current blood pressures in the 120s  UTI?  - cover [employer] Rocephin for now await results of urine culture Chest pain in the setting of SVT will cycle CE, may benefit from cardiology consult and stress testing at some point.   Prophylaxis:  SCD, Protonix  CODE STATUS: Presumed to be full code   Ksenia Kunz 02/01/2012, 10:42 PM

## 2012-02-01 NOTE — ED Notes (Signed)
Per EMS patient had been

## 2012-02-01 NOTE — ED Notes (Signed)
Per EMS patient was cutting trees with a friend today and started to have chest pain and dizziness.

## 2012-02-01 NOTE — ED Notes (Signed)
Patient states that he starts drinking about 5am every day.  Denies chest pain or discomfort at this time.

## 2012-02-01 NOTE — ED Notes (Signed)
Patient states his last drink was today around noon. Patient states he was helping cut trees today and he started to have chest pain and dizziness. Patient states he has a hx of seizures and denies having a seizures in many years.

## 2012-02-01 NOTE — ED Notes (Signed)
Heart rate continues to be 140's.  Diltiazem drip increased to 15mg /hr

## 2012-02-01 NOTE — ED Notes (Signed)
Cardizem drip decreased to 10mg/hr.

## 2012-02-01 NOTE — ED Notes (Signed)
Lab called regarding Room 4 Anthony Mcclure  CKMB 15.4, Total CK 1040 and Trop <0.30

## 2012-02-02 DIAGNOSIS — B1920 Unspecified viral hepatitis C without hepatic coma: Secondary | ICD-10-CM

## 2012-02-02 DIAGNOSIS — Z9114 Patient's other noncompliance with medication regimen: Secondary | ICD-10-CM

## 2012-02-02 DIAGNOSIS — R569 Unspecified convulsions: Secondary | ICD-10-CM | POA: Diagnosis present

## 2012-02-02 DIAGNOSIS — I10 Essential (primary) hypertension: Secondary | ICD-10-CM | POA: Diagnosis present

## 2012-02-02 DIAGNOSIS — F10239 Alcohol dependence with withdrawal, unspecified: Secondary | ICD-10-CM

## 2012-02-02 DIAGNOSIS — E869 Volume depletion, unspecified: Secondary | ICD-10-CM | POA: Diagnosis present

## 2012-02-02 DIAGNOSIS — M6282 Rhabdomyolysis: Secondary | ICD-10-CM | POA: Diagnosis present

## 2012-02-02 HISTORY — DX: Essential (primary) hypertension: I10

## 2012-02-02 HISTORY — DX: Alcohol dependence with withdrawal, unspecified: F10.239

## 2012-02-02 HISTORY — DX: Unspecified viral hepatitis C without hepatic coma: B19.20

## 2012-02-02 HISTORY — DX: Patient's other noncompliance with medication regimen: Z91.14

## 2012-02-02 LAB — COMPREHENSIVE METABOLIC PANEL
AST: 120 U/L — ABNORMAL HIGH (ref 0–37)
Albumin: 3 g/dL — ABNORMAL LOW (ref 3.5–5.2)
BUN: 15 mg/dL (ref 6–23)
Calcium: 8.8 mg/dL (ref 8.4–10.5)
Creatinine, Ser: 1.02 mg/dL (ref 0.50–1.35)
GFR calc non Af Amer: 77 mL/min — ABNORMAL LOW (ref >90)
Total Bilirubin: 1.4 mg/dL — ABNORMAL HIGH (ref 0.3–1.2)

## 2012-02-02 LAB — CARDIAC PANEL(CRET KIN+CKTOT+MB+TROPI)
CK, MB: 9.2 ng/mL (ref 0.3–4.0)
Total CK: 767 U/L — ABNORMAL HIGH (ref 7–232)
Troponin I: <0.3 ng/mL (ref <0.30)

## 2012-02-02 LAB — URINE CULTURE
Colony Count: NO GROWTH
Culture  Setup Time: 201302190037
Culture: NO GROWTH

## 2012-02-02 LAB — PHOSPHORUS: Phosphorus: 4.6 mg/dL (ref 2.3–4.6)

## 2012-02-02 LAB — TSH: TSH: 1.377 u[IU]/mL (ref 0.350–4.500)

## 2012-02-02 LAB — CBC
Hemoglobin: 14.1 g/dL (ref 13.0–17.0)
MCH: 27 pg (ref 26.0–34.0)
MCV: 79.3 fL (ref 78.0–100.0)
Platelets: 114 10*3/uL — ABNORMAL LOW (ref 150–400)
RBC: 5.23 MIL/uL (ref 4.22–5.81)
WBC: 7.9 10*3/uL (ref 4.0–10.5)

## 2012-02-02 LAB — MAGNESIUM: Magnesium: 1.5 mg/dL (ref 1.5–2.5)

## 2012-02-02 MED ORDER — FOLIC ACID 1 MG PO TABS
1.0000 mg | ORAL_TABLET | Freq: Every day | ORAL | Status: DC
  Administered 2012-02-02 – 2012-02-09 (×8): 1 mg via ORAL
  Filled 2012-02-02 (×8): qty 1

## 2012-02-02 MED ORDER — LORAZEPAM 2 MG/ML IJ SOLN: 0.0000 mg | Freq: Four times a day (QID) | INTRAMUSCULAR | Status: AC

## 2012-02-02 MED ORDER — LORAZEPAM 2 MG/ML IJ SOLN: 1.0000 mg | Freq: Four times a day (QID) | INTRAMUSCULAR | Status: AC | PRN

## 2012-02-02 MED ORDER — VITAMIN B-1 100 MG PO TABS
100.0000 mg | ORAL_TABLET | Freq: Every day | ORAL | Status: DC
  Administered 2012-02-02 – 2012-02-09 (×8): 100 mg via ORAL
  Filled 2012-02-02 (×8): qty 1

## 2012-02-02 MED ORDER — DEXTROSE 5 % IV SOLN
1.0000 g | INTRAVENOUS | Status: DC
  Administered 2012-02-03: 1 g via INTRAVENOUS
  Filled 2012-02-02 (×3): qty 10

## 2012-02-02 MED ORDER — THIAMINE HCL 100 MG/ML IJ SOLN
100.0000 mg | Freq: Every day | INTRAMUSCULAR | Status: DC
  Filled 2012-02-02 (×3): qty 1

## 2012-02-02 MED ORDER — ONDANSETRON HCL 4 MG/2ML IJ SOLN: 4.0000 mg | Freq: Four times a day (QID) | INTRAMUSCULAR | Status: DC | PRN

## 2012-02-02 MED ORDER — THIAMINE HCL 100 MG/ML IJ SOLN: Freq: Once | INTRAVENOUS | Status: DC

## 2012-02-02 MED ORDER — LORAZEPAM 1 MG PO TABS
1.0000 mg | ORAL_TABLET | Freq: Four times a day (QID) | ORAL | Status: AC | PRN
  Administered 2012-02-03: 1 mg via ORAL
  Filled 2012-02-02: qty 1

## 2012-02-02 MED ORDER — LORAZEPAM 2 MG/ML IJ SOLN: 0.0000 mg | Freq: Two times a day (BID) | INTRAMUSCULAR | Status: AC

## 2012-02-02 MED ORDER — DEXTROSE 5 % IV SOLN
5.0000 mg/h | INTRAVENOUS | Status: AC
  Administered 2012-02-02: 10 mg/h via INTRAVENOUS
  Administered 2012-02-03 (×2): 5 mg/h via INTRAVENOUS
  Filled 2012-02-02 (×3): qty 100

## 2012-02-02 MED ORDER — ADULT MULTIVITAMIN W/MINERALS CH
1.0000 | ORAL_TABLET | Freq: Every day | ORAL | Status: DC
  Administered 2012-02-02 – 2012-02-09 (×8): 1 via ORAL
  Filled 2012-02-02 (×8): qty 1

## 2012-02-02 MED ORDER — INFLUENZA VIRUS VACC SPLIT PF IM SUSP
0.5000 mL | INTRAMUSCULAR | Status: AC
  Administered 2012-02-03: 0.5 mL via INTRAMUSCULAR
  Filled 2012-02-02: qty 0.5

## 2012-02-02 MED ORDER — ONDANSETRON HCL 4 MG PO TABS: 4.0000 mg | ORAL_TABLET | Freq: Four times a day (QID) | ORAL | Status: DC | PRN

## 2012-02-02 MED ORDER — GUAIFENESIN-DM 100-10 MG/5ML PO SYRP: 5.0000 mL | ORAL_SOLUTION | ORAL | Status: DC | PRN

## 2012-02-02 MED ORDER — ACETAMINOPHEN 650 MG RE SUPP: 650.0000 mg | Freq: Four times a day (QID) | RECTAL | Status: DC | PRN

## 2012-02-02 MED ORDER — PNEUMOCOCCAL VAC POLYVALENT 25 MCG/0.5ML IJ INJ
0.5000 mL | INJECTION | INTRAMUSCULAR | Status: AC
  Administered 2012-02-03: 0.5 mL via INTRAMUSCULAR
  Filled 2012-02-02: qty 0.5

## 2012-02-02 MED ORDER — SODIUM CHLORIDE 0.9 % IJ SOLN
3.0000 mL | Freq: Two times a day (BID) | INTRAMUSCULAR | Status: DC
  Administered 2012-02-02 – 2012-02-09 (×14): 3 mL via INTRAVENOUS

## 2012-02-02 MED ORDER — ACETAMINOPHEN 325 MG PO TABS: 650.0000 mg | ORAL_TABLET | Freq: Four times a day (QID) | ORAL | Status: DC | PRN

## 2012-02-02 MED ORDER — ASPIRIN 81 MG PO CHEW
81.0000 mg | CHEWABLE_TABLET | Freq: Every day | ORAL | Status: DC
  Administered 2012-02-02 – 2012-02-09 (×8): 81 mg via ORAL
  Filled 2012-02-02 (×7): qty 1

## 2012-02-02 MED ORDER — ASPIRIN EC 81 MG PO TBEC
81.0000 mg | DELAYED_RELEASE_TABLET | Freq: Every day | ORAL | Status: DC
  Filled 2012-02-02: qty 1

## 2012-02-02 MED ORDER — ALUM & MAG HYDROXIDE-SIMETH 200-200-20 MG/5ML PO SUSP: 30.0000 mL | Freq: Four times a day (QID) | ORAL | Status: DC | PRN

## 2012-02-02 MED ORDER — DILTIAZEM HCL ER COATED BEADS 180 MG PO CP24
180.0000 mg | ORAL_CAPSULE | Freq: Every day | ORAL | Status: DC
  Administered 2012-02-02: 180 mg via ORAL
  Filled 2012-02-02 (×2): qty 1

## 2012-02-02 NOTE — Progress Notes (Signed)
[facility]   Subjective: Alert. Denies current chest pain or shortness of breath. States has not seen a doctor in at least 10 years outside of the hospital. 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 Has not taken his antihypertensive medications or her Dilantin for multiple years. Endorses regular excessive alcohol intake with liquor. 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  Objective: Vital signs in last 24 hours: Temp:  [97.9 F (36.6 C)-98.2 F (36.8 C)] 98 F (36.7 C) (02/19 1553) Pulse Rate:  [38-143] 108  (02/19 1200) Resp:  [11-23] 18  (02/19 1200) BP: (98-135)/(50-114) 123/77 mmHg (02/19 1553) SpO2:  [90 %-100 %] 96 % (02/19 1553) FiO2 (%):  [2 %] 2 % (02/19 0015) Weight:  [89.1 kg (196 lb 6.9 oz)] 89.1 kg (196 lb 6.9 oz) (02/19 0032) Weight change:  Last BM Date: 02/02/12  Intake/Output from previous day: 02/18 0701 - 02/19 0700 In: 450 [P.O.:120; I.V.:330] Out: -  Intake/Output this shift: Total I/O In: 730 [P.O.:680; I.V.:50] Out: 550 [Urine:550]  General appearance: alert, cooperative, appears older than stated age and mild distress Resp: clear to auscultation bilaterally, on 2 L night nasal cannula oxygen maintaining saturations 97% Cardio: regular rate and rhythm, S1, S2 normal, no murmur, click, rub or gallop, IV fluid at keep open, Cardizem drip at 10 cc per hour GI: soft, non-tender; bowel sounds normal; no masses,  no organomegaly Extremities: extremities normal, atraumatic, no cyanosis or edema Neurologic: Grossly normal  Lab Results:  Basename 02/02/12 0232 02/01/12 1925  WBC 7.9 11.0*  HGB 14.1 15.7  HCT 41.5 45.9  PLT 114* 143*   BMET  Basename 02/02/12 0232 02/01/12 1925  NA 135 135  K 3.8 4.1  CL 98 93*  CO2 28 30  GLUCOSE 95 146*  BUN 15 17  CREATININE 1.02 1.59*  CALCIUM 8.8 9.9    Studies/Results: Dg Chest Port 1 View  02/01/2012  *RADIOLOGY REPORT*   Clinical Data: Chest pain.  Tachycardia.  Hypertension.  Alcohol abuse.  PORTABLE CHEST - 1 VIEW  Comparison: 06/14/2009.  Findings: Mildly progressive enlargement of the cardiac silhouette. Clear lungs with normal vascularity.  Thoracic spine degenerative changes.  IMPRESSION: Mildly progressive cardiomegaly.  No acute abnormality.  Original Report Authenticated By: Darrol Angel, M.D.    Medications:  I have reviewed the patient's current medications. Scheduled:    . aspirin  81 mg Oral Daily  . cefTRIAXone (ROCEPHIN) IVPB 1 gram/50 mL D5W  1 g Intravenous Q24H  . diltiazem  180 mg Oral Daily  . diltiazem      . diltiazem  10 mg Intravenous Once  . folic acid  1 mg Oral Daily  . influenza  inactive virus vaccine  0.5 mL Intramuscular Tomorrow-1000  . LORazepam  0-4 mg Intravenous Q6H   Followed by  . LORazepam  0-4 mg Intravenous Q12H  . LORazepam  1 mg Intravenous Once  . mulitivitamin with minerals  1 tablet Oral Daily  . pneumococcal 23 valent vaccine  0.5 mL Intramuscular Tomorrow-1000  . banana bag IV fluid 1000 mL   Intravenous To Major  . sodium chloride  1,000 mL Intravenous Once  . sodium chloride  3 mL Intravenous Q12H  . thiamine  100 mg Oral Daily   Or  . thiamine  100 mg Intravenous Daily  . DISCONTD: aspirin EC  81 mg Oral Daily  . DISCONTD: general admission  iv infusion   Intravenous Once    Assessment/Plan:  Principal Problem:  Atrial flutter with rapid ventricular response *Review of previous hospitalizations demonstrate patient has presented with atrial flutter in the past and has been related to acute intoxication and associated volume depletion and alcohol withdrawals *We'll continue to focus on rate control and will attempt to wean and discontinue IV Cardizem in favor of oral long acting Cardizem *Given his history of noncompliance and ongoing alcohol abuse he is a poor candidate for long-term Coumadin anticoagulation- will start ASA  *2-D echocardiogram  is pending  Active Problems:  Alcohol abuse/Alcohol withdrawal seizure *Patient admits to at least one fifth of alcohol per day *Currently on CIWA protocol *Given degree of daily alcohol use watch for severe alcohol withdrawal not responsive to current detox protocol *History of alcohol withdrawal only related seizures and has not been on anti-epileptic medications for many years so we'll monitor at this time * would recommend inpatient rehab for him   Acute renal failure/Rhabdomyolysis *CPK elevated in trending downward after rehydration *Creatinine has decreased from 1.59 to 1.02 after rehydration   Hypotension/ Fluid volume depletion *Related to ongoing alcohol abuse and alcohol-related diuresis and inadequate oral rehydration *Continue IV fluids   Hepatitis C *Review of old records and laboratory data showed patient is HCV positive with confirmatory labs positive as well *Given ongoing alcohol abuse he is not a candidate for medical therapy *Check liver function studies to determine if any cirrhotic laboratory changes   HTN (hypertension) *Currently is normotensive in setting of IV Cardizem utilized to treat above problem *As noted are transitioning to oral Cardizem *Has when necessary clonidine available for hypertensive urgency related to alcohol withdrawal   Noncompliance with medication treatment due to intermittent use of medication *Long-standing issue noted in prior hospitalizations and certainly influenced by his addiction to alcohol *Patient reports although he is employed he has a case manager to assist him in managing his finances and make sure his rent is paid. He will need to followup with his case manager after discharge. *Patient endorses does not have primary care provider-we'll see if he qualifies for health serve or the teaching service clinic [facility] at [facility]  Disposition *Maintenance stepdown *High risk for complications related to progressive alcohol  withdrawal    LOS: 1 day   Junious Silk, ANP pager [phone]  [facility]-team 8 [email or web address] Password: [email or web address]  02/02/2012, 4:50 PM  I have examined the patient, reviewed the chart and discussed the plan with Junious Silk, NP. I have modifed the above note and agree with it.   Calvert Cantor, MD [phone]

## 2012-02-02 NOTE — Progress Notes (Signed)
   CARE MANAGEMENT NOTE 02/02/2012  Patient:  KARAN, RAMNAUTH   Account Number:  [id number]  Date Initiated:  02/02/2012  Documentation initiated by:  Onnie Boer  Subjective/Objective Assessment:   PT WAS ADMITTED WITH A FIB AND ETOH ABUSE     Action/Plan:   PROGRESSION OF CARE AND DISCHARGE PLANNING   Anticipated DC Date:  02/07/2012   Anticipated DC Plan:  HOME/SELF CARE  In-house referral  Clinical Social Worker      DC Planning Services  CM consult      Choice offered to / List presented to:             Status of service:  In process, will continue to follow Medicare Important Message given?   (If response is "NO", the following Medicare IM given date fields will be blank) Date Medicare IM given:   Date Additional Medicare IM given:    Discharge Disposition:    Per UR Regulation:  Reviewed for med. necessity/level of care/duration of stay  Comments:  UR COMPLETED.  Shelley Cocke, RN,BSN 02/02/12 1251 PT WAS ADMITTED WITH ETOH ABUSE AND A FIB W/ RVR ON CARD GTT.   PT IS FROM HOME WITH SELF CARE AND PLANS TO GO BACK TO HOME WITH MEDICALLY STABLE FOR DC.

## 2012-02-02 NOTE — Progress Notes (Signed)
Report given to RN Lu Duffel and pt transferred (walked) to [id number].

## 2012-02-02 NOTE — Progress Notes (Signed)
[facility]   Subjective: Alert. Denies current chest pain or shortness of breath. States has not seen a doctor in at least 10 years outside of the hospital. 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 Has not taken his antihypertensive medications or her Dilantin for multiple years. Endorses regular excessive alcohol intake with liquor. 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  Objective: Vital signs in last 24 hours: Temp:  [98.1 F (36.7 C)-98.2 F (36.8 C)] 98.2 F (36.8 C) (02/19 0015) Pulse Rate:  [38-143] 143  (02/19 1030) Resp:  [11-23] 19  (02/18 2345) BP: (98-134)/(50-114) 101/82 mmHg (02/19 0900) SpO2:  [90 %-100 %] 97 % (02/19 1030) FiO2 (%):  [2 %] 2 % (02/19 0015) Weight:  [89.1 kg (196 lb 6.9 oz)] 89.1 kg (196 lb 6.9 oz) (02/19 0032) Weight change:  Last BM Date: 02/02/12  Intake/Output from previous day: 02/18 0701 - 02/19 0700 In: 450 [P.O.:120; I.V.:330] Out: -  Intake/Output this shift: Total I/O In: 500 [P.O.:480; I.V.:20] Out: -   General appearance: alert, cooperative, appears older than stated age and mild distress Resp: clear to auscultation bilaterally, on 2 L night nasal cannula oxygen maintaining saturations 97% Cardio: regular rate and rhythm, S1, S2 normal, no murmur, click, rub or gallop, IV fluid at keep open, Cardizem drip at 10 cc per hour GI: soft, non-tender; bowel sounds normal; no masses,  no organomegaly Extremities: extremities normal, atraumatic, no cyanosis or edema Neurologic: Grossly normal  Lab Results:  Basename 02/02/12 0232 02/01/12 1925  WBC 7.9 11.0*  HGB 14.1 15.7  HCT 41.5 45.9  PLT 114* 143*   BMET  Basename 02/02/12 0232 02/01/12 1925  NA 135 135  K 3.8 4.1  CL 98 93*  CO2 28 30  GLUCOSE 95 146*  BUN 15 17  CREATININE 1.02 1.59*  CALCIUM 8.8 9.9    Studies/Results: Dg Chest Port 1 View  02/01/2012  *RADIOLOGY REPORT*  Clinical Data:  Chest pain.  Tachycardia.  Hypertension.  Alcohol abuse.  PORTABLE CHEST - 1 VIEW  Comparison: 06/14/2009.  Findings: Mildly progressive enlargement of the cardiac silhouette. Clear lungs with normal vascularity.  Thoracic spine degenerative changes.  IMPRESSION: Mildly progressive cardiomegaly.  No acute abnormality.  Original Report Authenticated By: Darrol Angel, M.D.    Medications:  I have reviewed the patient's current medications. Scheduled:   . aspirin  81 mg Oral Daily  . cefTRIAXone (ROCEPHIN) IVPB 1 gram/50 mL D5W  1 g Intravenous Q24H  . diltiazem  180 mg Oral Daily  . diltiazem      . diltiazem  10 mg Intravenous Once  . folic acid  1 mg Oral Daily  . influenza  inactive virus vaccine  0.5 mL Intramuscular Tomorrow-1000  . LORazepam  0-4 mg Intravenous Q6H   Followed by  . LORazepam  0-4 mg Intravenous Q12H  . LORazepam  1 mg Intravenous Once  . mulitivitamin with minerals  1 tablet Oral Daily  . pneumococcal 23 valent vaccine  0.5 mL Intramuscular Tomorrow-1000  . banana bag IV fluid 1000 mL   Intravenous To Major  . sodium chloride  1,000 mL Intravenous Once  . sodium chloride  3 mL Intravenous Q12H  . thiamine  100 mg Oral Daily   Or  . thiamine  100 mg Intravenous Daily  . DISCONTD: aspirin EC  81 mg Oral Daily  . DISCONTD: general admission iv  infusion   Intravenous Once    Assessment/Plan:  Principal Problem:  Atrial flutter with rapid ventricular response *Review of previous hospitalizations demonstrate patient has presented with atrial flutter in the past and has been related to acute intoxication and associated volume depletion and alcohol withdrawals *We'll continue to focus on rate control and will attempt to wean and discontinue IV Cardizem in favor of oral long acting Cardizem *Given his history of noncompliance and ongoing alcohol abuse he is a poor candidate for long-term Coumadin anticoagulation *2-D echocardiogram is pending  Active Problems:   Alcohol abuse/Alcohol withdrawal seizure *Patient admits to at least one fifth of alcohol per day *Currently on CIWA protocol *Given degree of daily alcohol use watch for severe alcohol withdrawal not responsive to current detox protocol *History of alcohol withdrawal only related seizures and has not been on anti-epileptic medications for many years so we'll monitor at this time   Acute renal failure/Rhabdomyolysis *CPK elevated in trending downward after rehydration *Creatinine has decreased from 1.59 to 1.02 after rehydration   Hypotension/ Fluid volume depletion *Related to ongoing alcohol abuse and alcohol-related diuresis and inadequate oral rehydration *Continue IV fluids   Hepatitis C *Review of old records and laboratory data showed patient is HCV positive with confirmatory labs positive as well *Given ongoing alcohol abuse he is not a candidate for medical therapy *Check liver function studies to determine if any cirrhotic laboratory changes   HTN (hypertension) *Currently is normotensive in setting of IV Cardizem utilized to treat above problem *As noted are transitioning to oral Cardizem *Has when necessary clonidine available for hypertensive urgency related to alcohol withdrawal   Noncompliance with medication treatment due to intermittent use of medication *Long-standing issue noted in prior hospitalizations and certainly influenced by his addiction to alcohol *Patient reports although he is employed he has a case manager to assist him in managing his finances and make sure his rent is paid. He will need to followup with his case manager after discharge. *Patient endorses does not have primary care provider-we'll see if he qualifies for health serve or the teaching service clinic here at [facility]  Disposition *Maintenance stepdown *High risk for complications related to progressive alcohol withdrawal    LOS: 1 day   Junious Silk, ANP pager [phone]  [facility] [email or web address] Password: [email or web address]  02/02/2012, 12:25 PM  I have examined the patient and reviewed the chart. I agree with the above note.   Calvert Cantor, MD [phone]

## 2012-02-02 NOTE — Progress Notes (Signed)
PT note  Another PT order received.  Pt seen this AM and dc'd from PT.  Please see eval note.  [employer] PT [phone]

## 2012-02-02 NOTE — Progress Notes (Signed)
  Echocardiogram 2D Echocardiogram has been performed.  Anthony Mcclure [facility] 02/02/2012, 8:53 AM

## 2012-02-02 NOTE — Evaluation (Signed)
Physical Therapy Evaluation Patient Details Name: TAYDEN NICHELSON MRN: [id number] DOB: 1951-05-01 Today's Date: 02/02/2012  Problem List:  Patient Active Problem List  Diagnoses  . Alcohol abuse  . Atrial fibrillation with RVR  . Acute renal failure  . Hypotension  . SVT (supraventricular tachycardia)    Past Medical History:  Past Medical History  Diagnosis Date  . Hypertension   . CHF (congestive heart failure)   . Alcohol abuse   . Dysrhythmia     paroxismal atrial fib  . Seizure    Past Surgical History: History reviewed. No pertinent past surgical history.  PT Assessment/Plan/Recommendation PT Assessment Clinical Impression Statement: Pt adm with a-fib with RVR.  Pt independent with all mobility.  HR up to 143 with activity (nsg aware).  No further PT needed. PT Recommendation/Assessment: Patent does not need any further PT services No Skilled PT: Patient is independent with all acitivity/mobility PT Recommendation Follow Up Recommendations: No PT follow up Equipment Recommended: None recommended by PT Prior Functioning  Home Living Lives With: Other (Comment) (with an old man "rents room") Home Layout: One level Home Access: Stairs to enter Entrance Stairs-Rails: Right Entrance Stairs-Number of Steps: 2 Home Adaptive Equipment: None Prior Function Level of Independence: Independent with basic ADLs;Independent with homemaking with ambulation;Independent with gait;Independent with transfers 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  Cognition Cognition Arousal/Alertness: Awake/alert Overall Cognitive Status: Appears within functional limits for tasks assessed Sensation/Coordination   Extremity Assessment RLE Assessment RLE Assessment: Within Functional Limits LLE Assessment LLE Assessment: Within Functional Limits Mobility (including Balance) Bed Mobility Supine to Sit: 7: Independent Sitting - Scoot to Edge of Bed: 7:  Independent Transfers Sit to Stand: 7: Independent Stand to Sit: 7: Independent Ambulation/Gait Ambulation/Gait Assistance: 7: Independent Ambulation Distance (Feet): 350 Feet Assistive device: None Gait Pattern: Within Functional Limits Gait velocity: 5.02 ft/sec  High Level Balance High Level Balance Activites: Direction changes;Turns;Sudden stops;Head turns;Other (comment) (picking up object off of floor) High Level Balance Comments: Independent with all of the above Exercise    End of Session PT - End of Session Equipment Utilized During Treatment: Gait belt Activity Tolerance: Patient tolerated treatment well Patient left: in chair;with call bell in reach Nurse Communication: Mobility status for ambulation  Neymar Dowe 02/02/2012, 10:46 AM  [facility] PT [phone]

## 2012-02-03 MED ORDER — CLONIDINE HCL 0.1 MG PO TABS
0.1000 mg | ORAL_TABLET | Freq: Three times a day (TID) | ORAL | Status: DC
  Administered 2012-02-03: 0.1 mg via ORAL
  Filled 2012-02-03 (×4): qty 1

## 2012-02-03 NOTE — Progress Notes (Signed)
[facility]  Subjective: Alert. No current complaints regarding chest pain shortness of breath or awareness of tachypalpitations. Dr. Sharon Seller asked him if he was interested in alcohol detoxification and addiction treatment. Patient said "yes" and smiled at Dr. Sharon Seller and laughed.  Objective: Vital signs in last 24 hours: Temp:  [98 F (36.7 C)-99 F (37.2 C)] 98.3 F (36.8 C) (02/20 1149) Pulse Rate:  [119-149] 149  (02/20 1149) Resp:  [18] 18  (02/20 0000) BP: (104-141)/(62-103) 110/62 mmHg (02/20 1200) SpO2:  [95 %-98 %] 97 % (02/20 1149) Weight change:  Last BM Date: 02/02/12  Intake/Output from previous day: 02/19 0701 - 02/20 0700 In: 1263 [P.O.:1160; I.V.:53; IV Piggyback:50] Out: 1600 [Urine:1600] Intake/Output this shift: Total I/O In: 25 [I.V.:25] Out: 350 [Urine:350]  General appearance: alert, cooperative, appears older than stated age Resp: clear to auscultation bilaterally, on 2 L night nasal cannula oxygen maintaining saturations 97% Cardio:Irregular rate and rhythm consistent with atrial fibrillation, S1, S2 normal, no murmur, click, rub or gallop, IV fluid at keep open, Cardizem drip at 5 cc per hour, with sleeping rates are in the 70s when awake rates are between 140 and 150 GI: soft, non-tender; bowel sounds normal; no masses,  no organomegaly Extremities: extremities normal, atraumatic, no cyanosis or edema Neurologic: Grossly normal  Lab Results:  Basename 02/02/12 0232 02/01/12 1925  WBC 7.9 11.0*  HGB 14.1 15.7  HCT 41.5 45.9  PLT 114* 143*   BMET  Basename 02/02/12 0232 02/01/12 1925  NA 135 135  K 3.8 4.1  CL 98 93*  CO2 28 30  GLUCOSE 95 146*  BUN 15 17  CREATININE 1.02 1.59*  CALCIUM 8.8 9.9    Medications:  I have reviewed the patient's current medications.  Assessment/Plan:   Atrial flutter with rapid ventricular response *Review of previous hospitalizations demonstrate patient has presented with atrial flutter in the  past and has been related to acute intoxication and associated volume depletion and alcohol withdrawals *We were unsuccessful in weaning IV Cardizem to off because of recurrent RPR so we'll continue with IV Cardizem and adjust based on rates *Given his history of noncompliance and ongoing alcohol abuse he is a poor candidate for long-term Coumadin anticoagulation- will start ASA  *2-D echocardiogram reveals normal systolic function without regional wall motion abnormalities   Alcohol abuse/Alcohol withdrawal seizure *Patient admits to at least one fifth of alcohol per day *Currently on CIWA protocol *Given degree of daily alcohol use watch for severe alcohol withdrawal not responsive to current detox protocol *History of alcohol withdrawal related seizures and has not been on anti-epileptic medications for many years so we'll monitor at this time * would recommend inpatient rehab for him   Acute renal failure/Rhabdomyolysis *CPK trending downward after rehydration *Creatinine has decreased from 1.59 to 1.02 after rehydration   Hypotension/ Fluid volume depletion *Related to ongoing alcohol abuse and alcohol-related diuresis and inadequate oral rehydration *Continue IV fluids   Hepatitis C *Review of old records and laboratory data showed patient is HCV positive with confirmatory labs positive as well *Given ongoing alcohol abuse he is not a candidate for medical therapy *Transaminases show mildly elevated total bilirubin at 1.4 with normal alkaline phosphatase low albumin 3.0 AST 120 and ALT 81   HTN (hypertension) *Currently is normotensive in setting of IV Cardizem utilized to treat above problem *Has when necessary clonidine available for hypertensive urgency related to alcohol withdrawal   Noncompliance with medication treatment due to intermittent use of medication *Long-standing  issue noted in prior hospitalizations and certainly influenced by his addiction to alcohol *Patient  reports although he is employed he has a Sports coach to assist him in managing his finances and make sure his rent is paid. He will need to followup with his case manager after discharge. *Patient endorses does not have primary care provider-we'll see if he qualifies for health serve or the teaching service clinic here at [facility]  Disposition *Remain in stepdown *High risk for complications related to progressive alcohol withdrawal *PT and OT evaluated and seen no acute or chronic needs    LOS: 2 days   Anthony Mcclure, ANP pager [phone]  [facility] [email or web address] Password: [email or web address]  02/03/2012, 1:07 PM  I have personally examined this patient and reviewed the entire database. I have reviewed the above note, made any necessary editorial changes, and agree with its content.  Lonia Blood, MD [facility]

## 2012-02-03 NOTE — Progress Notes (Deleted)
Notified PA  [facility], that patient's Coreg was held due to HR in the 50's.

## 2012-02-03 NOTE — Progress Notes (Addendum)
.  Clinical social worker completed patient psychosocial assessment, please see assessment in patient shadow chart.  Pt and CSW discussed pt current substance abuse and completed SBIRT assessment. Pt states he has beendrinking since he was 9 but is done now because the Dr. York Spaniel he was going to die if he kept drinking. Pt shared with CSW that "now that I'm retired, there is nothing else to do but drink or get in trouble with women." CSW offered resources and treatment facilities however pt stated, "I got it, I can do it on my own." Pt also stated, "I can do this on my own cause i'm too scared to drink now." Pt stated he was contact csw or nurse if he had questions regarding substance abuse and treatment. .No further Clinical Social Work needs, signing off.   Catha Gosselin, Theresia Majors  [phone] .02/03/2012 17:19pm

## 2012-02-03 NOTE — Progress Notes (Deleted)
Notified Dr Shirlee Latch that patient's heart rate was in the 30's to 50's but patient was not symptomatic and BP was stable. Advised to continue to monitor, no new orders given.

## 2012-02-03 NOTE — Progress Notes (Signed)
OT Note:  Order received, chart reviewed.  Brief contact with pt who reports being at baseline in mobility and ADL.  PT evaluation also confirms.  Did not proceed with full eval.  Signing off.02/03/2012 Martie Round, OTR/L Pager: [phone]

## 2012-02-04 LAB — BASIC METABOLIC PANEL
BUN: 12 mg/dL (ref 6–23)
CO2: 29 mEq/L (ref 19–32)
Calcium: 9.1 mg/dL (ref 8.4–10.5)
Chloride: 99 mEq/L (ref 96–112)
Creatinine, Ser: 0.87 mg/dL (ref 0.50–1.35)
Glucose, Bld: 88 mg/dL (ref 70–99)

## 2012-02-04 MED ORDER — SODIUM CHLORIDE 0.9 % IV SOLN
INTRAVENOUS | Status: DC
  Administered 2012-02-04: 10 mL/h via INTRAVENOUS

## 2012-02-04 MED ORDER — DILTIAZEM HCL ER COATED BEADS 240 MG PO CP24
240.0000 mg | ORAL_CAPSULE | Freq: Every day | ORAL | Status: DC
  Administered 2012-02-04 – 2012-02-05 (×2): 240 mg via ORAL
  Filled 2012-02-04 (×2): qty 1

## 2012-02-04 NOTE — Progress Notes (Signed)
[facility]  Subjective: Alert. Believes current cardiac problems related to prior chest trauma several years ago. Denies chest pain or awareness of palpitations.  Objective: Vital signs in last 24 hours: Temp:  [97.4 F (36.3 C)-98.6 F (37 C)] 98.4 F (36.9 C) (02/21 0731) Pulse Rate:  [100-149] 112  (02/21 0800) Resp:  [18] 18  (02/20 1929) BP: (94-141)/(41-99) 127/66 mmHg (02/21 0900) SpO2:  [94 %-98 %] 98 % (02/21 0800) Weight change:  Last BM Date: 02/02/12  Intake/Output from previous day: 02/20 0701 - 02/21 0700 In: 363 [P.O.:240; I.V.:123] Out: 350 [Urine:350] Intake/Output this shift: Total I/O In: 525 [P.O.:480; I.V.:45] Out: -   General appearance: alert, cooperative, appears older than stated age ENT: Vocalization is hoarse, no cough Resp: clear to auscultation bilaterally, on room air, sats 98% Cardio:Irregular rate and rhythm consistent with atrial fibrillation, S1, S2 normal, no murmur, click, rub or gallop, IV fluid at keep open, Cardizem drip at 15 cc per hour, with sleeping rates are in the 70s when awake rates are in the 120"s GI: soft, non-tender; bowel sounds normal; no masses,  no organomegaly Extremities: extremities normal, atraumatic, no cyanosis or edema Neurologic: Grossly normal  Lab Results:  Basename 02/02/12 0232 02/01/12 1925  WBC 7.9 11.0*  HGB 14.1 15.7  HCT 41.5 45.9  PLT 114* 143*   BMET  Basename 02/04/12 0512 02/02/12 0232  NA 135 135  K 3.6 3.8  CL 99 98  CO2 29 28  GLUCOSE 88 95  BUN 12 15  CREATININE 0.87 1.02  CALCIUM 9.1 8.8    Medications:  I have reviewed the patient's current medications.  Assessment/Plan:   Atrial flutter with rapid ventricular response *Review of previous hospitalizations demonstrate patient has presented with atrial flutter in the past and has been related to acute intoxication and associated volume depletion and alcohol withdrawals *We were unsuccessful in weaning IV Cardizem to  off because of recurrent RPR so we'll continue with IV Cardizem and adjust based on rates *Will try to transition to high dose Cardizem 240 mg- if can't wean may need cardiology evaluation *Given his history of noncompliance and ongoing alcohol abuse he is a poor candidate for long-term Coumadin anticoagulation- will start ASA  *2-D echocardiogram reveals normal systolic function without regional wall motion abnormalities   Alcohol abuse/Alcohol withdrawal seizure *Patient admits to at least one fifth of alcohol per day *Currently on CIWA protocol *Given degree of daily alcohol use watch for severe alcohol withdrawal not responsive to current detox protocol *History of alcohol withdrawal related seizures and has not been on anti-epileptic medications for many years so we'll monitor at this time * would recommend inpatient rehab for him   Acute renal failure/Rhabdomyolysis *CPK trending downward after rehydration *Creatinine has decreased from 1.59 to 1.02 after rehydration   Hypotension/ Fluid volume depletion *Related to ongoing alcohol abuse and alcohol-related diuresis and inadequate oral rehydration *KVO IV fluids   Hepatitis C *Review of old records and laboratory data showed patient is HCV positive with confirmatory labs positive as well *Given ongoing alcohol abuse he is not a candidate for medical therapy *Transaminases show mildly elevated total bilirubin at 1.4 with normal alkaline phosphatase low albumin 3.0 AST 120 and ALT 81   HTN (hypertension) *Currently is normotensive in setting of IV Cardizem utilized to treat above problem *Has when necessary clonidine available for hypertensive urgency related to alcohol withdrawal   Noncompliance with medication treatment due to intermittent use of medication *Long-standing issue noted  in prior hospitalizations and certainly influenced by his addiction to alcohol *Patient reports although he is employed he has a Sports coach to  assist him in managing his finances and make sure his rent is paid. He will need to followup with his case manager after discharge. *Patient endorses does not have primary care provider-we'll see if he qualifies for health serve or the teaching service clinic here at [facility]  Disposition *Remain in stepdown *High risk for complications related to progressive alcohol withdrawal *PT and OT evaluated and seen no acute or chronic needs    LOS: 3 days   Junious Silk, ANP pager [phone]  [facility] [email or web address] Password: [email or web address]  02/04/2012, 10:35 AM  I have examined the patient and reviewed the chart. I have discussed the plan with Junious Silk, NP and agree with the above note.   Calvert Cantor, MD [phone]

## 2012-02-05 MED ORDER — CLONIDINE HCL 0.2 MG PO TABS
0.2000 mg | ORAL_TABLET | Freq: Three times a day (TID) | ORAL | Status: DC
  Administered 2012-02-05 – 2012-02-06 (×4): 0.2 mg via ORAL
  Filled 2012-02-05 (×5): qty 1

## 2012-02-05 MED ORDER — DILTIAZEM HCL ER COATED BEADS 360 MG PO CP24
360.0000 mg | ORAL_CAPSULE | Freq: Every day | ORAL | Status: DC
  Administered 2012-02-07 – 2012-02-09 (×3): 360 mg via ORAL
  Filled 2012-02-05 (×5): qty 1

## 2012-02-05 NOTE — Progress Notes (Signed)
Spoke with the pt and he stated that he didn't need a doctor bc a doctor couldn't fix him, he just needed to stop drinking.  After much conversation pt agreed to allowing me to arrange a pcp.  Will call Dr. Maxwell Caul office to arrange appt.  If they dont call me before the end of the business day i will f/u on Monday.  If the pt is DC'd over the weekend i will call him once he gets home with the appt date.   Willa Rough [phone] OR [phone] 02/05/2012

## 2012-02-05 NOTE — Progress Notes (Signed)
TRANSFERRED TO 2011 BY WHEELCHAIR, STABLE, BELONGINGS WITH PT.  REPORT GIVEN TO RN.

## 2012-02-05 NOTE — Progress Notes (Signed)
[facility]  Subjective: No acute resp distress.  Denies f/c, sob, n/v, abdom pain, or ha.  Is tolerating diet without difficulty.    Objective: Blood pressure 140/84, pulse 101, temperature 97.9 F (36.6 C), temperature source Oral, resp. rate 18, height 6\' 3"  (1.905 m), weight 89.1 kg (196 lb 6.9 oz), SpO2 96.00%.  Intake/Output from previous day: 02/21 0701 - 02/22 0700 In: 1218 [P.O.:1160; I.V.:58] Out: 1125 [Urine:1125] Intake/Output this shift: Total I/O In: 353 [P.O.:350; I.V.:3] Out: -   General: No acute respiratory distress Lungs: Clear to auscultation bilaterally without wheezes or crackles Cardiovascular: Regular rate and rhythm without murmur gallop or rub normal S1 and S2-some episodic sinus tachy during my exam Abdomen: Nontender, nondistended, soft, bowel sounds positive, no rebound, no ascites, no appreciable mass Extremities: No significant cyanosis, clubbing, or edema bilateral lower extremities  Lab Results: BMET  Basename 02/04/12 0512  NA 135  K 3.6  CL 99  CO2 29  GLUCOSE 88  BUN 12  CREATININE 0.87  CALCIUM 9.1   Medications:  I have reviewed the patient's current medications.  Assessment/Plan:   Atrial flutter with rapid ventricular response *Review of previous hospitalizations demonstrate patient has presented with atrial flutter in the past and has been related to acute intoxication and associated volume depletion and alcohol withdrawals *Given his history of noncompliance and ongoing alcohol abuse he is a poor candidate for long-term Coumadin anticoagulation- will start ASA  *2-D echocardiogram reveals normal systolic function without regional wall motion abnormalities *now on oral meds only - transfer to tele bed - adjust oral meds for better control    Alcohol abuse/Alcohol withdrawal seizure *Patient admits to at least one fifth of alcohol per day *Currently on CIWA protocol *Given degree of daily alcohol use watch for severe  alcohol withdrawal not responsive to current detox protocol *History of alcohol withdrawal related seizures and has not been on anti-epileptic medications for many years so we'll monitor at this time *recommended inpatient rehab for him - he is not interested - he says "i have already quite on my own-that bald-headed guy said if I kept drinking it would kill me so i have just stopped"   Acute renal failure/Rhabdomyolysis *CPK trending downward after rehydration *Creatinine has normalized   Hypotension/ Fluid volume depletion *Related to ongoing alcohol abuse and alcohol-related diuresis and inadequate oral rehydration *KVO IV fluids   Hepatitis C *Review of old records and laboratory data showed patient is HCV positive with confirmatory labs positive as well *Given ongoing alcohol abuse he is not a candidate for medical therapy *Transaminases show mildly elevated total bilirubin at 1.4 with normal alkaline phosphatase low albumin 3.0 AST 120 and ALT 81   HTN (hypertension) *Currently is normotensive  *utilize clonidine to help w/ withdrawal sx, rate, and for BP contorl    Noncompliance with medication treatment due to intermittent use of medication *Long-standing issue noted in prior hospitalizations and certainly influenced by his addiction to alcohol *Patient reports although he is employed he has a case Production designer, theatre/television/film to assist him in managing his finances and make sure his rent is paid. He will need to followup with his case manager after discharge. *Patient endorses does not have primary care provider-we'll see if he qualifies for health serve   Disposition *transfer to tele bed *High risk for complications related to progressive alcohol withdrawal *PT and OT evaluated and see no acute or chronic needs   LOS: 4 days  02/05/2012, 12:57 PM  Lonia Blood,  MD [facility] Office  [phone] Pager [phone]  On-Call/Text Page:      [email or web address]      password  [facility]

## 2012-02-06 LAB — CBC
MCH: 27.1 pg (ref 26.0–34.0)
MCHC: 33.8 g/dL (ref 30.0–36.0)
MCV: 80.2 fL (ref 78.0–100.0)
Platelets: 157 10*3/uL (ref 150–400)
RBC: 5.01 MIL/uL (ref 4.22–5.81)

## 2012-02-06 MED ORDER — METOPROLOL TARTRATE 25 MG PO TABS
25.0000 mg | ORAL_TABLET | Freq: Two times a day (BID) | ORAL | Status: DC
  Administered 2012-02-06: 25 mg via ORAL
  Filled 2012-02-06 (×3): qty 1

## 2012-02-06 NOTE — Progress Notes (Signed)
Enter vitals for wrong patient the first time these are correct

## 2012-02-06 NOTE — Progress Notes (Signed)
[facility]  Subjective Patient denies chest pain, also denies shortness of breath. Chart reviewed. The nursing staff tachycardia/tele to 130s with minimal activity.  Objective: Blood pressure 128/89, pulse 80, temperature 97.7 F (36.5 C), temperature source Oral, resp. rate 19, height 6\' 3"  (1.905 m), weight 89.1 kg (196 lb 6.9 oz), SpO2 96.00%.  Intake/Output from previous day: 02/22 0701 - 02/23 0700 In: 1383 [P.O.:1380; I.V.:3] Out: 1400 [Urine:1400] Intake/Output this shift: Total I/O In: -  Out: 300 [Urine:300]  General: No acute respiratory distress Lungs: Clear to auscultation bilaterally without wheezes or crackles Cardiovascular: Regular, mildly tachycardic, without murmur gallop or rub normal S1 and S2 abdomen: Nontender, nondistended, soft, bowel sounds positive, no rebound, no ascites, no appreciable mass Extremities: No significant cyanosis, clubbing, or edema bilateral lower extremities  Lab Results: BMET  Basename 02/04/12 0512  NA 135  K 3.6  CL 99  CO2 29  GLUCOSE 88  BUN 12  CREATININE 0.87  CALCIUM 9.1   Medications:  I have reviewed the patient's current medications.  Assessment/Plan:   Atrial flutter with rapid ventricular response *Review of previous hospitalizations demonstrate patient has presented with atrial flutter in the past and has been related to acute intoxication and associated volume depletion and alcohol withdrawals *Given his history of noncompliance and ongoing alcohol abuse he is a poor candidate for long-term Coumadin anticoagulation- continuing ASA  *2-D echocardiogram reveals normal systolic function without regional wall motion abnormalities -Continue Cardizem -Will add metoprolol to improve heart rate control-follow and further adjust as appropriate.   Alcohol abuse/Alcohol withdrawal seizure *Patient admits to at least one fifth of alcohol per day and that his been drinking since age 45 *Continue CIWA  protocol *Continue to monitor for severe alcohol withdrawal not responsive to current detox protocol *History of alcohol withdrawal related seizures and has not been on anti-epileptic medications for many years so we'll monitor at this time *Counseled to quit alcohol, he states 'he's done with alcohol now', we'll have social work to given resources to quit alcohol   Acute renal failure/Rhabdomyolysis *Recheck CPK in the a.m. *Creatinine has normalized   Hypotension/ Fluid volume depletion *Related to ongoing alcohol abuse and alcohol-related diuresis and inadequate oral rehydration *Resolved   Hepatitis C *Review of old records and laboratory data showed patient is HCV positive with confirmatory labs positive as well *Given ongoing alcohol abuse he is not a candidate for medical therapy *Transaminases show mildly elevated total bilirubin at 1.4 with normal alkaline phosphatase low albumin 3.0 AST 120 and ALT 81   HTN (hypertension) *Currently is normotensive  -Clonidine changed to metoprolol as above to better control  rate.   Noncompliance with medication treatment due to intermittent use of medication *Long-standing issue noted in prior hospitalizations and certainly influenced by his addiction to alcohol *Patient reports although he is employed he has a case manager to assist him in managing his finances and make sure his rent is paid. He will need to followup with his case manager after discharge. *Patient endorses does not have primary care provider-we'll see if he qualifies for health serve   Disposition Continue monitoring for withdrawal, plan DC when heart rate better controlled.  LOS: 5 days  02/06/2012, 12:22 PM  Harnoor Reta [facility] Office  [phone] Pager [phone]

## 2012-02-07 LAB — BASIC METABOLIC PANEL
BUN: 15 mg/dL (ref 6–23)
BUN: 13 mg/dL (ref 6–23)
CO2: 28 mEq/L (ref 19–32)
Calcium: 9.5 mg/dL (ref 8.4–10.5)
Creatinine, Ser: 0.98 mg/dL (ref 0.50–1.35)
Creatinine, Ser: 0.99 mg/dL (ref 0.50–1.35)
GFR calc Af Amer: >90 mL/min (ref >90)
GFR calc non Af Amer: 87 mL/min — ABNORMAL LOW (ref >90)
Glucose, Bld: 96 mg/dL (ref 70–99)
Potassium: 4 mEq/L (ref 3.5–5.1)
Sodium: 133 mEq/L — ABNORMAL LOW (ref 135–145)

## 2012-02-07 LAB — HEPATIC FUNCTION PANEL
ALT: 204 U/L — ABNORMAL HIGH (ref 0–53)
Bilirubin, Direct: 0.1 mg/dL (ref 0.0–0.3)
Indirect Bilirubin: 0.5 mg/dL (ref 0.3–0.9)

## 2012-02-07 MED ORDER — SODIUM CHLORIDE 0.9 % IV BOLUS (SEPSIS)
500.0000 mL | Freq: Once | INTRAVENOUS | Status: AC
  Administered 2012-02-07: 19:00:00 via INTRAVENOUS

## 2012-02-07 MED ORDER — METOPROLOL TARTRATE 50 MG PO TABS
50.0000 mg | ORAL_TABLET | Freq: Two times a day (BID) | ORAL | Status: DC
  Administered 2012-02-07 (×2): 50 mg via ORAL
  Filled 2012-02-07 (×2): qty 1

## 2012-02-07 MED ORDER — METOPROLOL TARTRATE 50 MG PO TABS
50.0000 mg | ORAL_TABLET | Freq: Every day | ORAL | Status: DC
  Administered 2012-02-08 – 2012-02-09 (×2): 50 mg via ORAL
  Filled 2012-02-07 (×3): qty 1

## 2012-02-07 MED ORDER — METOPROLOL TARTRATE 25 MG PO TABS
25.0000 mg | ORAL_TABLET | Freq: Every day | ORAL | Status: DC
  Administered 2012-02-08: 25 mg via ORAL
  Filled 2012-02-07 (×2): qty 1

## 2012-02-07 MED ORDER — SODIUM CHLORIDE 0.9 % IV SOLN: INTRAVENOUS | Status: DC

## 2012-02-07 NOTE — Progress Notes (Signed)
[facility]  Subjective Per nursing staff patient tachycardic to the 180s earlier this am, he denies chest pain, also denies shortness of breath. Objective: Blood pressure 155/97, pulse 115, temperature 98.6 F (37 C), temperature source Oral, resp. rate 16, height 6\' 3"  (1.905 m), weight 89.1 kg (196 lb 6.9 oz), SpO2 99.00%.  Intake/Output from previous day: 02/23 0701 - 02/24 0700 In: 480 [P.O.:480] Out: 2975 [Urine:2975] Intake/Output this shift:    General: No acute respiratory distress Lungs: Clear to auscultation bilaterally without wheezes or crackles Cardiovascular: Regular, mildly tachycardic, without murmur gallop or rub normal S1 and S2 abdomen: Nontender, nondistended, soft, bowel sounds positive, no rebound, no ascites, no appreciable mass Extremities: No significant cyanosis, clubbing, or edema bilateral lower extremities  Lab Results: BMET  Basename 02/07/12 0450 02/06/12 2322  NA 140 133*  K 4.0 4.2  CL 103 98  CO2 30 28  GLUCOSE 82 96  BUN 13 15  CREATININE 0.99 0.98  CALCIUM 9.8 9.5   Medications:  I have reviewed the patient's current medications.  Assessment/Plan:   Atrial flutter with rapid ventricular response *Review of previous hospitalizations demonstrate patient has presented with atrial flutter in the past and has been related to acute intoxication and associated volume depletion and alcohol withdrawals *Given his history of noncompliance and ongoing alcohol abuse he is a poor candidate for long-term Coumadin anticoagulation- continuing ASA  *2-D echocardiogram reveals normal systolic function without regional wall motion abnormalities -Continue Cardizem -Will increase metoprolol to 50 twice a day and monitor.  -If heart rate control still poorly controlled, will consult cardiology in the a.m.   Alcohol abuse/Alcohol withdrawal seizure *Patient admits to at least one fifth of alcohol per day and that his been drinking since age  61 *Continue CIWA protocol, no evidence of alcohol withdrawal at this time. *Continue to monitor for severe alcohol withdrawal not responsive to current detox protocol *History of alcohol withdrawal related seizures and has not been on anti-epileptic medications for many years so we'll monitor at this time *Counseled to quit alcohol, he states 'he's done with alcohol now', we'll have social work to given resources to quit alcohol   Acute renal failure/Rhabdomyolysis *Resolved, CPK - 222 *Creatinine has normalized   Hypotension/ Fluid volume depletion *Related to ongoing alcohol abuse and alcohol-related diuresis and inadequate oral rehydration *Resolved   Hepatitis C *Review of old records and laboratory data showed patient is HCV positive with confirmatory labs positive as well *Given ongoing alcohol abuse he is not a candidate for medical therapy *Transaminases show mildly elevated total bilirubin at 1.4 with normal alkaline phosphatase low albumin 3.0 AST 120 and ALT 81   HTN (hypertension) *Currently is normotensive  -On metoprolol and Cardizem as above.   Noncompliance with medication treatment due to intermittent use of medication *Long-standing issue noted in prior hospitalizations and certainly influenced by his addiction to alcohol *Patient reports although he is employed he has a case manager to assist him in managing his finances and make sure his rent is paid. He will need to followup with his case manager after discharge. *Patient endorses does not have primary care provider-we'll see if he qualifies for health serve    LOS: 6 days  02/07/2012, 8:22 AM  Merritt Mccravy [facility] Office  [phone] Pager [phone]

## 2012-02-07 NOTE — Progress Notes (Signed)
Monitor tech notified RN of pt's heart rate dropping from 70's  To 32. Pt asymptomatic and heart rate increased back up to 50's then to 70's within seconds. Pt's BP at that time was 86/52 manually. MD notified, orders received to give pt a 500 cc bolus of NS and keep NS running at [facility] after bolus.Pt's heart rate jumping from 40's -150's while receiving bolus.  Pt still asymptomatic and alert and talking Pt's BP after fluid bolus was 104/67. Rapid Response RN called to evaluate pt.  Martyn Malay, RN

## 2012-02-07 NOTE — Progress Notes (Signed)
CSW received referral for SA for Pt.  Pt given AA resources for [place] but did not think he has an issue with drinking. CSW attempted to make a connection for Pt re: drinking and health status but Pt denied there was a connection.  PT stated he may go to [employer]  Pt encouraged to reach out to CSW dept. If further needs but CSW signing off on this referral. Milus Banister MSW,LCSW w/e Coverage [phone]

## 2012-02-08 ENCOUNTER — Encounter (HOSPITAL_COMMUNITY): Payer: Self-pay | Admitting: Physician Assistant

## 2012-02-08 DIAGNOSIS — I4892 Unspecified atrial flutter: Secondary | ICD-10-CM

## 2012-02-08 NOTE — Progress Notes (Signed)
  [facility]  Subjective Denies any complaints of, per telemetry heart rate to  transiently overnight, and in the 70s,80s since then. Last pm He had transient hypotension responded to IV fluids. Objective: Blood pressure 142/94, pulse 60, temperature 98.3 F (36.8 C), temperature source Oral, resp. rate 18, height 6\' 3"  (1.905 m), weight 89.1 kg (196 lb 6.9 oz), SpO2 98.00%.  Intake/Output from previous day: 02/24 0701 - 02/25 0700 In: 938.7 [P.O.:720; I.V.:218.7] Out: 1500 [Urine:1500] Intake/Output this shift: Total I/O In: 240 [P.O.:240] Out: 800 [Urine:800]  General: No acute respiratory distress Lungs: Clear to auscultation bilaterally without wheezes or crackles Cardiovascular: Irregular, rate controlled, without murmur gallop or rub normal S1 and S2 abdomen: Nontender, nondistended, soft, bowel sounds positive, no rebound, no ascites, no appreciable mass Extremities: No significant cyanosis, clubbing, or edema bilateral lower extremities  Lab Results: BMET  Basename 02/07/12 0450 02/06/12 2322  NA 140 133*  K 4.0 4.2  CL 103 98  CO2 30 28  GLUCOSE 82 96  BUN 13 15  CREATININE 0.99 0.98  CALCIUM 9.8 9.5   Medications:  I have reviewed the patient's current medications.  Assessment/Plan:   Atrial flutter with rapid ventricular response *Review of previous hospitalizations demonstrate patient has presented with atrial flutter in the past and has been related to acute intoxication and associated volume depletion and alcohol withdrawals *Given his history of noncompliance and ongoing alcohol abuse he is a poor candidate for long-term Coumadin anticoagulation- continuing ASA  *2-D echocardiogram reveals normal systolic function without regional wall motion abnormalities -Okay to Continue current metoprolol 50 mg in a.m. and 25mg  in p.m., and Cardizem, per cards. -Appreciate cardiology assistance. - Alcohol abuse/Alcohol withdrawal seizure *Patient admits to at  least one fifth of alcohol per day and that his been drinking since age 61 *Continue CIWA protocol, no evidence of alcohol withdrawal at this time. 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   Acute renal failure/Rhabdomyolysis *Resolved, CPK - 222 *Creatinine has normalized   Hypotension/ Fluid volume depletion *Related to ongoing alcohol abuse and alcohol-related diuresis and inadequate oral rehydration *Hypotension overnight following metoprolol dose-resolved with IV fluids, monitor on current meds.   Hepatitis C *Review of old records and laboratory data showed patient is HCV positive with confirmatory labs positive as well *Given ongoing alcohol abuse he is not a candidate for medical therapy *Transaminases show mildly elevated total bilirubin at 1.4 with normal alkaline phosphatase low albumin 3.0 AST 120 and ALT 81   HTN (hypertension) Continue  metoprolol and Cardizem as above. Hypotension overnight resolved   Noncompliance with medication treatment due to intermittent use of medication *Long-standing issue noted in prior hospitalizations and certainly influenced by his addiction to alcohol *Patient reports although he is employed he has a case manager to assist him in managing his finances and make sure his rent is paid. He will need to followup with his case manager after discharge. *Patient endorses does not have primary care provider-to be set up with PCP for outpatient followup prior to discharge.    LOS: 7 days  02/08/2012, 5:43 PM  Dacie Mandel [facility] Office  [phone] Pager [phone]

## 2012-02-08 NOTE — Consult Note (Signed)
Cardiology Consult Note   Patient ID: Anthony Mcclure MRN: [id number], DOB/AGE: 06/25/1951   Admit date: 02/01/2012 Date of Consult: 02/08/2012  Primary Physician: Sheila Oats, MD, MD Primary Cardiologist: New to cardiology  Pt. Profile: [name] is a 61yo male with history of alcohol abuse (reported drinks 1-2 fifths of alcohol daily for the last 10 years), history of seizures (secondary to delirium tremens), PSVT (on BB in the past, noncompliant), HTN, history of tobacco abuse and hepatitis C who was admitted to [facility] hospital for atrial fibrillation with RVR.   Of note, he apparently has presented with atrial flutter in the past related to acute intoxication, associated hypovolemia and alcohol withdrawal.   Reason for consult: evaluation of atrial flutter with RVR  Problem List: Past Medical History  Diagnosis Date  . Hypertension   . CHF (congestive heart failure)   . Alcohol abuse   . Dysrhythmia     paroxismal atrial fib  . Seizure     History reviewed. No pertinent past surgical history.   Allergies: No Known Allergies  HPI:   He states that he drinks about a fifth or two daily starting at 5am each day. He states he has been drinking since the age of 72. 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 He states he felt dizzy. He walked down the street to see his aunt. She gave him food and water; however, this did not help. Later that afternoon, his symptoms worsened with associated headache, diaphoresis, lightheadedness, R-sided chest pain or shortness of breath. He states he has had seizures in the past which he attributes to multiple head injuries. He does not take his outpatient anticonvulsive meds. Last seizure was about 5 years ago. He states that his diet is not always consistent. He denied  palpitations, involuntary movements, hallucinations and syncope. EMS was called and he was given oxygen.   Upon presentation to the ED, SVT noted on the EKG which spontaneously resolved. Patient had been on BB in the past, but was apparently noncompliant. Found to have acute renal failure (Cr 1.59) and started on IVF (Cr trended down to 1.02 the following day). First set of CEs revealed elevated CK/CK-MB at 1042/16.4, and subsequently trended down. He was noted have chest pain and diaphoresis. EKG revealed SVT at 227 bpm with spontaneous resolution. He was subsequently found to be in .atrial fibrillation/flutter with RVR- started on Cardizem IV with reduction of rates to the low 100s.   He was subsequently admitted to stepdown by the [facility] and placed on CIWA protocol. He was started on ASA for anticoagulation as he was thought to be a poor Coumadin candidate secondary to medication noncompliance. A 2D echo revealed LVEF 55-60% with no WMAs. BP remained labile/hypotensive and in the setting of alcohol-driven diuresis, IVF were continued. Rates remained well-controlled initially; however, while attempting to wean Cardizem IV, rates increased. This improved, and he was started on high-dose Cardizem PO and transferred to telemetry. He became tachycardic on telemetry to 130s with minimal activity on 02/23. Metoprolol (Lopressor 25mg  BID) was added for further rate-control. The following day rates increased to 180s. Toprol was increased to 50mg  BID. Last night, patient's rate dropped from 70s to 32 bpm with BP of 86/52. Patient was asymptomatic. Given bolus of IVF. HR fluctuated from  40-150 bpm while receiving bolus, BP improved to 104/67.   He denies complaints today. He denies tremors, seizure activity or hallucinations. Two doses of Ativan have been given on 02/19 and 02/20, respectively, this admission. He denies palpitations. He states that he had been compliant on medications in the past  which were managed by his prior PCP. He had not taken them due to their interaction with alcohol per patient. He currently remains in 2:1/3:1 atrial flutter; however rates are 80-100 bpm at present.   Home Medications: Prior to Admission medications   Not on File    Inpatient Medications:     . aspirin  81 mg Oral Daily  . diltiazem  360 mg Oral Daily  . folic acid  1 mg Oral Daily  . metoprolol tartrate  50 mg Oral Q breakfast  . metoprolol tartrate  25 mg Oral q1800  . mulitivitamin with minerals  1 tablet Oral Daily  . sodium chloride  500 mL Intravenous Once  . sodium chloride  3 mL Intravenous Q12H  . thiamine  100 mg Oral Daily  . DISCONTD: metoprolol tartrate  50 mg Oral BID   No prescriptions prior to admission    Family History  Problem Relation Age of Onset  . Heart disease Father   . Alcohol abuse Father   . Alcohol abuse Brother   . Diabetes type II Mother      History   Social History  . Marital Status: Single    Spouse Name: N/A    Number of Children: N/A  . Years of Education: N/A   Occupational History  . 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 Smoking status: Former Games developer  . Smokeless tobacco: Not on file  . Alcohol Use: Yes     statrts drinking everyday at 5am drinks about 2 fiths been drinking for about 10 years  . Drug Use: No  . Sexually Active: Not on file   Other Topics Concern  . Not on file   Social History Narrative   Lives in [place], [place] with a friend.      Review of Systems: General: negative for chills, fever, night sweats or weight changes.  Cardiovascular: positive for chest pain, shortness of breath, dizziness, lightheadedness and diaphoresis, negative for dyspnea on exertion, edema, orthopnea, palpitations, paroxysmal nocturnal dyspnea  Dermatological: negative for rash Respiratory: negative for cough or wheezing Urologic: negative for hematuria Abdominal: negative for nausea, vomiting,  diarrhea, bright red blood per rectum, melena, or hematemesis Neurologic: negative for visual changes, syncope All other systems reviewed and are otherwise negative except as noted above.  Physical Exam: Blood pressure 134/90, pulse 88, temperature 98.1 F (36.7 C), temperature source Oral, resp. rate 19, height 6\' 3"  (1.905 m), weight 89.1 kg (196 lb 6.9 oz), SpO2 99.00%.    General: Appears older than stated age, in NAD Head: Normocephalic, atraumatic, sclera non-icteric, injected, no xanthomas  Neck: Negative for carotid bruits. JVD not elevated. Lungs: Clear bilaterally to auscultation without wheezes, rales, or rhonchi. Breathing is unlabored. Heart: Regularly irregular, with S1 S2. No murmurs, rubs, or gallops appreciated. Abdomen: Soft, non-tender, non-distended with normoactive bowel sounds. No hepatomegaly. No rebound/guarding. No obvious abdominal masses. Msk:  Strength and tone appears normal for age. Extremities: Trace pedal edema. No clubbing or cyanosis.  Distal pedal pulses are 2+ and equal bilaterally. Neuro: Alert and oriented X 3. Moves all extremities spontaneously. Psych:  Responds to  questions appropriately with a normal affect.  Labs: Recent Labs  Basename 02/06/12 2322   WBC 6.5   HGB 13.6   HCT 40.2   MCV 80.2   PLT 157    Lab 02/07/12 0450 02/06/12 2322 02/04/12 0512  NA 140 133* 135  K 4.0 4.2 3.6  CL 103 98 99  CO2 30 28 29   BUN 13 15 12   CREATININE 0.99 0.98 0.87  CALCIUM 9.8 9.5 9.1  PROT -- 6.9 --  BILITOT -- 0.6 --  ALKPHOS -- 54 --  ALT -- 204* --  AST -- 186* --  AMYLASE -- -- --  LIPASE -- -- --  GLUCOSE 82 96 88    Recent Labs  Basename 02/07/12 0450   CKTOTAL 222   CKMB --   CKMBINDEX --   TROPONINI --    Radiology/Studies: Dg Chest Port 1 View  02/01/2012  *RADIOLOGY REPORT*  Clinical Data: Chest pain.  Tachycardia.  Hypertension.  Alcohol abuse.  PORTABLE CHEST - 1 VIEW  Comparison: 06/14/2009.  Findings: Mildly progressive  enlargement of the cardiac silhouette. Clear lungs with normal vascularity.  Thoracic spine degenerative changes.  IMPRESSION: Mildly progressive cardiomegaly.  No acute abnormality.  Original Report Authenticated By: Darrol Angel, M.D.    EKG: 02/19: atrial flutter, 3:1, TWI II, III, aVF           02/18 at 1915: supraventricular tachycardia, 227 bpm, TWI II, III, aVF           02/18 at 2319: atrial flutter, 2:1/3:1, trace PVCs, TWI II, III, aVF Telemetry: atrial fibrillation/atrial flutter with RVR, 2:1, 3:1, 140-150 bpm Echo:   Study Conclusions  - Left ventricle: The cavity size was normal. There was mild concentric hypertrophy. Systolic function was normal. The estimated ejection fraction was in the range of 55% to 60%. Wall motion was normal; there were no regional wall motion abnormalities. The study is not technically sufficient to allow evaluation of LV diastolic function. - Aortic valve: Trivial regurgitation. - Ascending aorta: The ascending aorta was mildly dilated. - Left atrium: The atrium was mildly dilated. - Atrial septum: No defect or patent foramen ovale was identified. Echocardiography. M-mode, complete 2D, spectral Doppler, and color Doppler. Height: Height: 190.5cm. Height: 75in. Weight: Weight: 88.9kg. Weight: 195.6lb. Body mass index: BMI: 24.5kg/m^2. Body surface area: BSA: 2.54m^2. Blood pressure: 114/82. Patient status: Inpatient. Location: ICU/CCU  ------------------------------------------------------------ ------------------------------------------------------------ Left ventricle: The cavity size was normal. There was mild concentric hypertrophy. Systolic function was normal. The estimated ejection fraction was in the range of 55% to 60%. Wall motion was normal; there were no regional wall motion abnormalities. Mid cavity false tendon is seen. The study is not technically sufficient to allow evaluation of LV diastolic  function. ------------------------------------------------------------ Aortic valve: Structurally normal valve. Trileaflet. Cusp separation was normal. Doppler: Transvalvular velocity was within the normal range. There was no stenosis. Trivial regurgitation. ------------------------------------------------------------ Aorta: Ascending aorta: The ascending aorta was mildly dilated. ------------------------------------------------------------ Mitral valve: Structurally normal valve. Leaflet separation was normal. Doppler: Transvalvular velocity was within the normal range. There was no evidence for stenosis. No regurgitation. ------------------------------------------------------------ Left atrium: The atrium was mildly dilated.  ------------------------------------------------------------ Atrial septum: No defect or patent foramen ovale was identified. ------------------------------------------------------------ Right ventricle: The cavity size was normal. Wall thickness was normal. Systolic function was normal. ------------------------------------------------------------ Pulmonic valve: The valve appears to be grossly normal. Doppler: No significant regurgitation. ------------------------------------------------------------ Tricuspid valve: Structurally normal valve. Leaflet separation was normal. Doppler: Transvalvular velocity was within the  normal range. No regurgitation. ------------------------------------------------------------ Right atrium: The atrium was normal in size. ------------------------------------------------------------ Pericardium: There was no pericardial effusion. ------------------------------------------------------------ Systemic veins: Inferior vena cava: The vessel was normal in size; the respirophasic diameter changes were in the normal range (= 50%); findings are consistent with normal central venous  pressure. ------------------------------------------------------------ 2D measurements Normal Doppler measurements Normal Left ventricle Mitral valve LVID ED, 43.3 mm 43-52 Peak E vel 63. cm/s ------ chord, 8 PLAX Peak A vel 77. cm/s ------ LVID ES, 25.2 mm 23-38 1 chord, Deceleration 109 ms 150-23 PLAX time 0 FS, 42 % >29 Peak E/A 0.8 ------ chord, ratio PLAX Systemic veins LVPW, ED 12.5 mm ------ Estimated CVP 10 mm ------ IVS/LVPW 0.93 <1.3 Hg ratio, ED Ventricular septum IVS, ED 11.63 mm ------ Aorta Root 43 mm ------ diam, ED AAo AP 36 mm ------ diam, S Left atrium AP dim 41 mm ------ AP dim 1.88 cm/m^2 <2.2 index Vol, S 60 ml ------ Vol 27.5 ml/m^2 ------ index, S  ASSESSMENT AND PLAN:   1. Atrial flutter with RVR- initially, in setting of acute alcohol intoxication and hypovolemia, currently likely attributed to alcohol withdrawal. Pt with a history of atrial flutter with RVR in the past (2009 per [facility] records) in a similar presentation as this admission. He was discharged with Toprol-XL, but has been noncompliant with this and all other medications including antihypertensives and anticonvulsulants. Patient's rates have fluctuated throughout the admission. BP has remained well-controlled for the most part aside from recent episode of bradycardia with resultant hypotension with SBPs in the 80s. Upon reviewing telemetry, the patient has sharp paroxysms of atrial fibrillation; however the baseline rate appears well-controlled for the majority of a 24-hour period. This will likely improve the longer he abstains from alcohol and the more that his alcohol dependence improves.   In the [place] term, I agree that he is not a good Coumadin candidate secondary to noncompliance. He will need close follow-up with a PCP. SW consult has organized this, and promoted attending AA meetings. The patient states he may go. He lacks sufficient insight into his conditions, and disconnects his  alcohol dependence and health issues.   - Continue daily ASA  - Continue high-dose Cardizem CD PO  - Continue Lopressor PO regimen   - May be able to offer DCCV in the future if patient quits alcohol consumption and is compliant with meds, particularly Coumadin   - Will continue to monitor and adjust should he require transitioning back to IV or further PO titration keeping BP in mind  2. Hypotension- last night (SBP in 80s with HR in 40s) and this morning. Fluctuating with HR and also contributed by hypovolemia driven by alcohol abuse. This will likely improve as rates improve and with further alcohol abstinence. Patient has a normal EF without WMA, and responds appropriately for fluid boluses.  - Continue management per primary team   3. Hypertension- chronic issue; controlled for the most part this admission.   Signed, R. Hurman Horn, PA-C 02/08/2012, 11:55 AM  Patient seen with PA, agree with note. Atrial flutter with RVR developed after ETOH binge.  At baseline, patient drinks a lot of alcohol.  Suspect this is the root cause of the atrial flutter.  Rate is under control now on diltiazem CD and metopolol.  CHADSVASC = 1 (HTN).  He would be a poor candidate for anticoagulation given lack of reliability with followup and ongoing heavy ETOH abuse.  No changes for now.  I talked to him  at length about stopping/cutting back on ETOH.   Marca Ancona 02/08/2012 1:52 PM

## 2012-02-09 MED ORDER — ADULT MULTIVITAMIN W/MINERALS CH: 1.0000 | ORAL_TABLET | Freq: Every day | ORAL | Status: DC

## 2012-02-09 MED ORDER — METOPROLOL TARTRATE 50 MG PO TABS: ORAL_TABLET | ORAL | Status: DC

## 2012-02-09 MED ORDER — ASPIRIN 81 MG PO CHEW: 81.0000 mg | CHEWABLE_TABLET | Freq: Every day | ORAL | Status: DC

## 2012-02-09 MED ORDER — FOLIC ACID 1 MG PO TABS: 1.0000 mg | ORAL_TABLET | Freq: Every day | ORAL | Status: DC

## 2012-02-09 MED ORDER — THIAMINE HCL 100 MG PO TABS: 100.0000 mg | ORAL_TABLET | Freq: Every day | ORAL | Status: DC

## 2012-02-09 MED ORDER — DILTIAZEM HCL ER COATED BEADS 360 MG PO CP24: 360.0000 mg | ORAL_CAPSULE | Freq: Every day | ORAL | Status: DC

## 2012-02-09 NOTE — Progress Notes (Signed)
Pt. Discharged 02/09/2012  5:24 PM Discharge instructions reviewed with patient/family. Patient/family verbalized understanding. All Rx's given. Questions answered as needed. Stressed importance of coming to follow up appointments and taking medications as prescribed as well as avoiding alcohol.  Pt. Discharged to home with self. Taken off unit via W/C. Nickolas Madrid

## 2012-02-09 NOTE — Progress Notes (Signed)
Cardiology Progress Note Patient Name: Anthony Mcclure Date of Encounter: 02/09/2012, 9:19 AM     Subjective  No overnight events. Patient reports feeling well and able to ambulate yesterday without complaints of chest pain, palpitations, or shortness of breath. Hypotension resolved.   Objective   Telemetry: Atrial flutter 3:1 w/ baseline HRs 60s-80s and intermittently up to 110s-130s  Medications: . aspirin  81 mg Oral Daily  . diltiazem  360 mg Oral Daily  . folic acid  1 mg Oral Daily  . metoprolol tartrate  50 mg Oral Q breakfast  . metoprolol tartrate  25 mg Oral q1800  . mulitivitamin with minerals  1 tablet Oral Daily  . sodium chloride  3 mL Intravenous Q12H  . thiamine  100 mg Oral Daily   . sodium chloride 20 mL/hr (02/07/12 1930)    Physical Exam: Temp:  [98.1 F (36.7 C)-98.4 F (36.9 C)] 98.4 F (36.9 C) (02/26 0426) Pulse Rate:  [60-75] 75  (02/26 0426) Resp:  [18] 18  (02/26 0426) BP: (113-142)/(79-94) 113/79 mmHg (02/26 0426) SpO2:  [97 %-98 %] 97 % (02/26 0426)  General: Disheveled appearing black male in no acute distress. Head: Normocephalic, atraumatic, sclera non-icteric, nares are without discharge.  Neck: Supple. Negative for carotid bruits or JVD Lungs: Clear bilaterally to auscultation without wheezes, rales, or rhonchi. Breathing is unlabored. Heart: Regularly irregular rhythm with S1 S2 without murmurs, rubs, or gallops.  Abdomen: Soft, non-tender, non-distended with normoactive bowel sounds. No rebound/guarding. No obvious abdominal masses. Msk:  Strength and tone appear normal for age. Extremities: No edema. No clubbing or cyanosis. Distal pedal pulses are 2+ and equal bilaterally. Neuro: Alert and oriented X 3. Moves all extremities spontaneously. Psych:  Responds to questions appropriately with a normal affect.   Intake/Output Summary (Last 24 hours) at 02/09/12 0919 Last data filed at 02/09/12 0654  Gross per 24 hour  Intake  1212.33 ml  Output   3275 ml  Net -2062.67 ml    Labs:  [facility] 02/07/12 0450 02/06/12 2322  NA 140 133*  K 4.0 4.2  CL 103 98  CO2 30 28  GLUCOSE 82 96  BUN 13 15  CREATININE 0.99 0.98  CALCIUM 9.8 9.5   Basename 02/06/12 2322  AST 186*  ALT 204*  ALKPHOS 54  BILITOT 0.6  PROT 6.9  ALBUMIN 3.3*   Basename 02/06/12 2322  WBC 6.5  HGB 13.6  HCT 40.2  MCV 80.2  PLT 157    11/29/2008 15:10 02/01/2012 20:03 02/02/2012 02:33 02/02/2012 09:01 02/02/2012 15:43 02/07/2012 04:50  CK, MB  16.4 (HH)  9.2 (HH) 9.4 (HH)   CK Total 282 (H) 1042 (H) 767 (H) 632 (H) 686 (H) 222  Troponin I  <0.30 <0.30 <0.30 <0.30     Radiology/Studies:   02/02/12 - 2D Echocardiogram Study Conclusions: - Left ventricle: The cavity size was normal. There was mild concentric hypertrophy. Systolic function was normal. The estimated ejection fraction was in the range of 55% to 60%. Wall motion was normal; there were no regional wall motion abnormalities. The study is not technically sufficient to allow evaluation of LV diastolic function. - Aortic valve: Trivial regurgitation. - Ascending aorta: The ascending aorta was mildly dilated. - Left atrium: The atrium was mildly dilated. - Atrial septum: No defect or patent foramen ovale was identified.  02/01/2012 - CXR Findings: Mildly progressive enlargement of the cardiac silhouette. Clear lungs with normal vascularity.  Thoracic spine degenerative  changes.  IMPRESSION: Mildly progressive cardiomegaly.  No acute abnormality.      Assessment and Plan  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   1. Atrial flutter with RVR- initially, in setting of acute alcohol intoxication and  hypovolemia. This will likely improve the longer he abstains from alcohol. 2-D echocardiogram reveals normal systolic function without regional wall motion abnormalities or significant valvular abnls. Telemetry reveals fairly rate controlled a. flutter. He would be a poor candidate for anticoagulation given lack of reliability with followup and ongoing heavy ETOH abuse. He will need close follow-up with a PCP. SW consult has organized this, and promoted attending AA meetings.  - Continue daily ASA  - Continue Cardizem CD PO  - Continue Lopressor PO  - May be able to offer DCCV in the future if patient quits alcohol consumption and is compliant with meds, particularly Coumadin   2. Hypotension - Improved. SBP 110s-140s in the last 24hrs. - Continue management per primary team   3. Hypertension- chronic issue, cont current med regimen with uptitration per MD    Signed, HOPE, JESSICA PA-C   I have seen, examined the patient, and reviewed the above assessment and plan.   Pt with typical appearing atrial flutter.  V rates are now better controlled.  As he continues to drink heavy amounts of ETOH, he is not a candidate for anticoagulation at this point.  He is also clear that he has no interests presently in cessation or further treatment for his atrial flutter including catheter ablation or cardioversion.  He can be DC to home today. Follow-up with Anthony Mcclure 02/22/12 at 10:30 am  Resume current medicines at discharge.  If he is compliant with ETOH cessation and follow-up in our office, then he could possibly be enrolled in our coumadin clinic with close follow-up at that time.  Otherwise, rate control will have to be his long term strategy.  I am concerned that given his limited interests in compliance as well as ongoing ETOH that he will not do well long term.   Co Sign: Hillis Range, MD 02/09/2012 11:12 AM

## 2012-02-09 NOTE — Discharge Summary (Signed)
Discharge Note  Name: Anthony Mcclure MRN: [id number] DOB: Sep 07, 1951 61 y.o.  Date of Admission: 02/01/2012  7:08 PM Date of Discharge: 02/09/2012 Attending Physician: Kela Millin, MD  Discharge Diagnosis: Principal Problem:  *Atrial fibrillation with RVR Active Problems:  Alcohol abuse  Acute renal failure  Hypotension  Atrial flutter with rapid ventricular response  Alcohol withdrawal seizure  Hepatitis C  HTN (hypertension)  Noncompliance with medication treatment due to intermittent use of medication  Fluid volume depletion  Rhabdomyolysis   Discharge Medications: Medication List  As of 02/09/2012  4:42 PM   TAKE these medications         aspirin 81 MG chewable tablet   Chew 1 tablet (81 mg total) by mouth daily.      diltiazem 360 MG 24 hr capsule   Commonly known as: CARDIZEM CD   Take 1 capsule (360 mg total) by mouth daily.      folic acid 1 MG tablet   Commonly known as: FOLVITE   Take 1 tablet (1 mg total) by mouth daily.      metoprolol 50 MG tablet   Commonly known as: LOPRESSOR   Take 1 tablet every morning, and half a tablet every evening.      mulitivitamin with minerals Tabs   Take 1 tablet by mouth daily.      thiamine 100 MG tablet   Take 1 tablet (100 mg total) by mouth daily.            Disposition and follow-up:   Anthony Mcclure was discharged from [facility] in improved/stable condition.    Follow-up Appointments: Discharge Orders    Future Appointments: Provider: Department: Dept Phone: Center:   02/22/2012 10:30 AM Beatrice Lecher, PA [facility] [phone] [facility]      Consultations: Treatment Team:  Marca Ancona, MD  Procedures Performed:  Dg Chest Port 1 View  02/01/2012  *RADIOLOGY REPORT*  Clinical Data: Chest pain.  Tachycardia.  Hypertension.  Alcohol abuse.  PORTABLE CHEST - 1 VIEW  Comparison: 06/14/2009.  Findings: Mildly progressive enlargement of the cardiac silhouette. Clear  lungs with normal vascularity.  Thoracic spine degenerative changes.  IMPRESSION: Mildly progressive cardiomegaly.  No acute abnormality.  Original Report Authenticated By: Darrol Angel, M.D.    2D Echo Study Conclusions  - Left ventricle: The cavity size was normal. There was mild concentric hypertrophy. Systolic function was normal. The estimated ejection fraction was in the range of 55% to 60%. Wall motion was normal; there were no regional wall motion abnormalities. The study is not technically sufficient to allow evaluation of LV diastolic function. - Aortic valve: Trivial regurgitation. - Ascending aorta: The ascending aorta was mildly dilated. - Left atrium: The atrium was mildly dilated. - Atrial septum: No defect or patent foramen ovale was identified.     Admission HPI Anthony Mcclure is a 61 y.o. male with past medical history of Alcohol abuse, Hypertension and CHF (congestive heart failure),  Who presented with chest pain, headache and presyncope to ER was found to have severe tachycardia. likely a.fib/flutter with HR in 200's spontaneously improved to 140's patient was started on Diltiazem gtt with improvement of his heart rate to 90's  He reports daily use of EtOH, and was not taking his metoprolol. [facility] had prior admissions for the same in 2009, and then it was thought his atrial flutter was induced by alcohol. Echo from 2009 showed normal EF no clot or wall  motion abnormalities. He is not a candidate for coumadin given EtOH abuse.   He Stated on admission that he drank 2 fiths a day every day for 10 years, had hx of seizures due to DT's.   Physical Exam General: No acute respiratory distress  Lungs: Clear to auscultation bilaterally without wheezes or crackles  Cardiovascular: Irregular, rate controlled, without murmur gallop or rub normal S1 and S2  abdomen: Nontender, nondistended, soft, bowel sounds positive, no rebound, no ascites, no appreciable mass    Extremities: No significant cyanosis, clubbing, or edema bilateral lower extremities   Hospital Course by problem list: Principal Problem:  *Atrial fibrillation with RVR Active Problems:  Alcohol abuse  Acute renal failure  Hypotension  Atrial flutter with rapid ventricular response  Alcohol withdrawal seizure  Hepatitis C  HTN (hypertension)  Noncompliance with medication treatment due to intermittent use of medication  Fluid volume depletion  Rhabdomyolysis Atrial flutter with rapid ventricular response Patient was placed on cardizem drip on admission, and closely monitored in the step down unit . A 2-D echocardiogramwas done and revealed normal systolic function without regional wall motion abnormalities as above.Cardiac enzymes were done and showed elevated CKs(thought to be secondary to rhabdo- and resolved with hydration), and troponins were normal.TSH came back normal at 1.377. Review of previous hospitalizations demonstrated patient had presented with atrial flutter in the past and that was related to acute intoxication and associated volume depletion and alcohol withdrawals. Given his history of noncompliance and ongoing alcohol abuse he is a poor candidate for long-term Coumadin anticoagulation, and so was treated with ASA .  After his rate was initially controlled on the cardizem drip, he was transitioned to oral cardizem, and he was also on clonidine. His rate was poorly controlled on this regimen and so clonidine was dc'ed and metoprolol added and the dose up titrated. Patient developed bradycardia down to 32 after pm dose of metoprolol 50mg  was administered and BP dropped to 86/52 - 2/24, and he was assymptomatic. Following this episode he was bolused with NS and his Pm dose of metoprolol was decreased to 25mg . Cardiology was also consulted and [facility]/Dr Shirlee Latch saw patient and agreed with the above management and again educated and emphasized the need for him to quit  alcohol. He stated that they may be able to offer him DCCV or catheter ablationin the future if he quit alcohol and was compliant with his meds. He was then further monitored on his regimen of  metoprolol 50 mg in a.m. and 25mg  in p.m., and Cardizem, and his rate was remaining controlled and so cards, recommended from their standpoint that he be dc'ed nad this was done. He was set up for out patient follow up with [facility] and also with a PCP at [facility]. - Alcohol abuse/Alcohol withdrawal seizure  Patient admitted to drinking at least one fifth of alcohol per day and that he had  been drinking since age 36  He was placed on the ativan CIWA protocol, and was monitored for severe alcohol withdrawal, but did not show any evidence of alcohol withdrawal while in the hospital.  His history of alcohol withdrawal related seizures was noted and also that he had  not been on anti-epileptic medications for many years and so he was monitoredas already mentioned and did not have any seizure activity in the hospital. He was counseled to quit alcohol, and he stated 'he's done with alcohol now', SW was consulted and he was given resources to quit alcohol,  and he said he would follow up outpatient.  Acute renal failure secondary to Rhabdomyolysis  As discussed above his CKs were elevated with nl troponins and the impression was that it was due to rhabdo. His creatinine was also elevated at 1.59. He was hydrated with IVF and his CKs normalized - CPK - 222 prior to discharge.Creatinine also normalized -0.99. Hypotension/ Fluid volume depletion  * Resolved with hyrdation,the impression was that it was related to ongoing alcohol abuse and alcohol-related diuresis in the setting of inadequate oral rehydration.  Hepatitis C  *Review of old records and laboratory data showed patient is HCV positive with confirmatory labs positive as well. Given his ongoing alcohol abuse he is not a candidate for medical therapy. He is  to follow with PCP se up this hospital stay at [facility] and be referred to GI as appropriate out patient.  His transaminases show mildly elevated -AST 120 and ALT 81, with total bilirubin at 1.4 with normal alkaline phosphatase, low albumin 3.0.   HTN (hypertension)  Following resolution of hypotension, hiS BP was controlled with metoprolol and Cardizem discussed above.    Noncompliance with medication treatment due to intermittent use of medication  *Long-standing issue noted in prior hospitalizations and certainly influenced by his addiction to alcohol *Patient endorsed not having primary care provider-case manager in hosp. assisted with setting patient up with PCP at [facility] prior to dc. He was educated on the importance of complying with his meds and follow appointments.  *Patient reported although he was employed, he does have a case manager to assist him in managing his finances and make sure his rent is paid. He will need to followup with his case manager after discharge.     Discharge Vitals:  BP 116/81  Pulse 74  Temp(Src) 98.2 F (36.8 C) (Oral)  Resp 19  Ht 6\' 3"  (1.905 m)  Wt 89.1 kg (196 lb 6.9 oz)  BMI 24.55 kg/m2  SpO2 100%  Discharge Labs: No results found for this or any previous visit (from the past 24 hour(s)).  SignedKela Millin 02/09/2012, 4:42 PM

## 2012-02-09 NOTE — Progress Notes (Signed)
   CARE MANAGEMENT NOTE 02/09/2012  Patient:  Anthony Mcclure, Anthony Mcclure   Account Number:  [id number]  Date Initiated:  02/02/2012  Documentation initiated by:  Onnie Boer  Subjective/Objective Assessment:   PT WAS ADMITTED WITH A FIB AND ETOH ABUSE      PROGRESSION OF CARE AND DISCHARGE PLANNING   Anticipated DC Date:  02/07/2012   Anticipated DC Plan:  HOME/SELF CARE  In-house referral  Clinical Social Worker      DC Planning Services  CM consult       Per UR Regulation:  Reviewed for med. necessity/level of care/duration of stay  Comments:  Transferred to Team 5 02/09/12 1230 Anthony Winterton RN MSN CCM Pt is living with a roommate, states Anthony Mcclure @ [facility] manages his finances/pays bills, etc.,  has no PCP.  States he has medicare and medicaid but wants to establish primary care @ [facility].  Appt arranged for Thursday, 03/17/12, @ 2:45.  UR COMPLETED.  Anthony CLARK, RN,BSN 02/02/12 1251 PT WAS ADMITTED WITH ETOH ABUSE AND A FIB W/ RVR ON CARD GTT.   PT IS FROM HOME WITH SELF CARE AND PLANS TO GO BACK TO HOME WITH MEDICALLY STABLE FOR DC.

## 2012-02-22 ENCOUNTER — Encounter: Payer: Medicare Other | Admitting: Physician Assistant

## 2012-02-23 ENCOUNTER — Ambulatory Visit (INDEPENDENT_AMBULATORY_CARE_PROVIDER_SITE_OTHER): Payer: Medicare Other | Admitting: Physician Assistant

## 2012-02-23 ENCOUNTER — Encounter: Payer: Self-pay | Admitting: Physician Assistant

## 2012-02-23 VITALS — BP 130/94 | HR 77 | Ht 75.0 in | Wt 195.0 lb

## 2012-02-23 DIAGNOSIS — I10 Essential (primary) hypertension: Secondary | ICD-10-CM

## 2012-02-23 DIAGNOSIS — I4892 Unspecified atrial flutter: Secondary | ICD-10-CM

## 2012-02-23 DIAGNOSIS — F1010 Alcohol abuse, uncomplicated: Secondary | ICD-10-CM

## 2012-02-23 NOTE — Patient Instructions (Addendum)
Your physician recommends that you schedule a follow-up appointment in: 2-3 MONTHS WITH DR. Shirlee Latch  You have been referred to PT NEEDS TO EST WITH PRIMARY CARE DX HYPERTENSION  FOLLOW A LOW SODIUM DIET OF 4 GRAMS DADILY

## 2012-02-23 NOTE — Progress Notes (Signed)
[address] [place], [place]  [address] Phone: [phone] Fax:  [phone]  Date:  02/23/2012   Name:  Anthony Mcclure       DOB:  02-02-1951 MRN:  [id number]  PCP:  None  Primary Cardiologist:  Dr. Marca Ancona  Primary Electrophysiologist:  None    History of Present Illness: Anthony Mcclure is a 61 y.o. male who presents for post hospital follow up.  He has a history of alcohol abuse, seizure disorder, paroxysmal SVT, hypertension, tobacco abuse and hepatitis C.  He was admitted 2/18-2/26 with atrial flutter with rapid ventricular rate.  He was treated with rate control.  He was seen by Dr. Marca Ancona in consultation.  Thromboembolic risk factor profile was low at one.  He was felt to be a poor Coumadin candidate due to history of alcohol abuse.  He initially had acute renal failure which improved.  He also had an elevated CK which was thought to be related to rhabdomyolysis.  Labs: Potassium 4, creatinine 0.99, ALT 24, troponins negative, hemoglobin 13.6, TSH 1.377.  Chest x-ray was unremarkable.  Echocardiogram: Mild LVH, EF 55-60%, no regional wall motion abnormalities, mildly dilated ascending aorta, mild LAE.  No complaints today.  The patient denies chest pain, shortness of breath, syncope, orthopnea, PND or significant pedal edema.  Still drinking ETOH.  He does not seem committed to quitting.  He denies palpitations.    Past Medical History  Diagnosis Date  . Hypertension   . CHF (congestive heart failure)   . Alcohol abuse   . Dysrhythmia     paroxismal atrial fib  . Seizure     Current Outpatient Prescriptions  Medication Sig Dispense Refill  . aspirin 81 MG chewable tablet Chew 1 tablet (81 mg total) by mouth daily.  30 tablet  0  . diltiazem (CARDIZEM CD) 360 MG 24 hr capsule Take 1 capsule (360 mg total) by mouth daily.  30 capsule  0  . folic acid (FOLVITE) 1 MG tablet Take 1 tablet (1 mg total) by mouth daily.  30 tablet  0  .  metoprolol (LOPRESSOR) 50 MG tablet Take 1 tablet every morning, and half a tablet every evening.  45 tablet  0    Allergies: No Known Allergies  History  Substance Use Topics  . Smoking status: Former Games developer  . Smokeless tobacco: Not on file  . Alcohol Use: Yes     statrts drinking everyday at 5am drinks about 2 fiths been drinking for about 10 years     ROS:  Please see the history of present illness.   No melena, hematochezia, hematemesis.  All other systems reviewed and negative.   PHYSICAL EXAM: VS:  BP 130/94  Pulse 77  Ht 6\' 3"  (1.905 m)  Wt 195 lb (88.451 kg)  BMI 24.37 kg/m2 Well nourished, well developed, in no acute distress HEENT: normal Neck: no JVD Cardiac:  normal S1, S2; RRR; no murmur Lungs:  clear to auscultation bilaterally, no wheezing, rhonchi or rales Abd: soft, nontender, no hepatomegaly Ext: no edema Skin: warm and dry Neuro:  CNs 2-12 intact, no focal abnormalities noted  EKG:  Sinus rhythm, heart rate 77, normal axis, nonspecific ST-T wave changes, PAC  ASSESSMENT AND PLAN:  1. Paroxysmal Atrial Flutter  Back in NSR.  He is not a coumadin candidate.  Continue current medications.  We discussed refraining from ETOH.  Follow up with Dr. Marca Ancona in 3 mos.  2. HTN (hypertension)  Borderline control.  Low Na diet.  Continue current therapy for now.   3. Alcohol abuse  He has poor insight as to why he should stop drinking.  We discussed going to AA.  He needs a PCP.  I will refer him.      Luna Glasgow, PA-C  10:27 AM 02/23/2012

## 2012-04-16 ENCOUNTER — Encounter: Payer: Self-pay | Admitting: Internal Medicine

## 2012-04-16 DIAGNOSIS — Z0000 Encounter for general adult medical examination without abnormal findings: Secondary | ICD-10-CM | POA: Insufficient documentation

## 2012-04-18 ENCOUNTER — Ambulatory Visit: Payer: Medicare Other | Admitting: Internal Medicine

## 2012-04-18 DIAGNOSIS — Z0289 Encounter for other administrative examinations: Secondary | ICD-10-CM

## 2012-05-02 ENCOUNTER — Ambulatory Visit: Payer: Medicare Other | Admitting: Cardiology

## 2012-08-20 ENCOUNTER — Encounter (HOSPITAL_COMMUNITY): Payer: Self-pay | Admitting: *Deleted

## 2012-08-20 ENCOUNTER — Emergency Department (HOSPITAL_COMMUNITY): Payer: Medicare Other

## 2012-08-20 ENCOUNTER — Emergency Department (HOSPITAL_COMMUNITY)
Admission: EM | Admit: 2012-08-20 | Discharge: 2012-08-20 | Disposition: A | Discharge status: Home / Self Care | Payer: Medicare Other | Attending: Emergency Medicine | Admitting: Emergency Medicine

## 2012-08-20 DIAGNOSIS — B1920 Unspecified viral hepatitis C without hepatic coma: Secondary | ICD-10-CM | POA: Insufficient documentation

## 2012-08-20 DIAGNOSIS — Z833 Family history of diabetes mellitus: Secondary | ICD-10-CM | POA: Insufficient documentation

## 2012-08-20 DIAGNOSIS — S80219A Abrasion, unspecified knee, initial encounter: Secondary | ICD-10-CM

## 2012-08-20 DIAGNOSIS — F10229 Alcohol dependence with intoxication, unspecified: Secondary | ICD-10-CM | POA: Insufficient documentation

## 2012-08-20 DIAGNOSIS — IMO0002 Reserved for concepts with insufficient information to code with codable children: Secondary | ICD-10-CM | POA: Insufficient documentation

## 2012-08-20 DIAGNOSIS — Z87891 Personal history of nicotine dependence: Secondary | ICD-10-CM | POA: Insufficient documentation

## 2012-08-20 DIAGNOSIS — Z6379 Other stressful life events affecting family and household: Secondary | ICD-10-CM | POA: Insufficient documentation

## 2012-08-20 DIAGNOSIS — Z8249 Family history of ischemic heart disease and other diseases of the circulatory system: Secondary | ICD-10-CM | POA: Insufficient documentation

## 2012-08-20 DIAGNOSIS — S9030XA Contusion of unspecified foot, initial encounter: Secondary | ICD-10-CM | POA: Insufficient documentation

## 2012-08-20 DIAGNOSIS — I10 Essential (primary) hypertension: Secondary | ICD-10-CM | POA: Insufficient documentation

## 2012-08-20 DIAGNOSIS — Z91199 Patient's noncompliance with other medical treatment and regimen due to unspecified reason: Secondary | ICD-10-CM | POA: Insufficient documentation

## 2012-08-20 DIAGNOSIS — Z9119 Patient's noncompliance with other medical treatment and regimen: Secondary | ICD-10-CM | POA: Insufficient documentation

## 2012-08-20 MED ORDER — IBUPROFEN 800 MG PO TABS: 800.0000 mg | ORAL_TABLET | Freq: Three times a day (TID) | ORAL | Status: AC | PRN
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
## 2012-08-20 NOTE — ED Notes (Signed)
Pt resting quietly at the time. No signs of distress noted.  

## 2012-08-20 NOTE — ED Notes (Signed)
Pt resting quietly at the time. No signs of distress noted. Updated on plan of care.

## 2012-08-21 NOTE — ED Provider Notes (Signed)
Medical screening examination/treatment/procedure(s) were performed by non-physician practitioner and as supervising physician I was immediately available for consultation/collaboration.   Lyanne Co, MD 08/21/12 [phone]

## 2012-11-16 ENCOUNTER — Emergency Department (HOSPITAL_COMMUNITY): Payer: Medicare Other

## 2012-11-16 ENCOUNTER — Encounter (HOSPITAL_COMMUNITY): Payer: Self-pay | Admitting: *Deleted

## 2012-11-16 ENCOUNTER — Emergency Department (HOSPITAL_COMMUNITY)
Admission: EM | Admit: 2012-11-16 | Discharge: 2012-11-17 | Disposition: A | Discharge status: Home / Self Care | Payer: Medicare Other | Attending: Emergency Medicine | Admitting: Emergency Medicine

## 2012-11-16 DIAGNOSIS — F909 Attention-deficit hyperactivity disorder, unspecified type: Secondary | ICD-10-CM | POA: Insufficient documentation

## 2012-11-16 DIAGNOSIS — F1010 Alcohol abuse, uncomplicated: Secondary | ICD-10-CM | POA: Insufficient documentation

## 2012-11-16 DIAGNOSIS — I10 Essential (primary) hypertension: Secondary | ICD-10-CM | POA: Insufficient documentation

## 2012-11-16 DIAGNOSIS — F10929 Alcohol use, unspecified with intoxication, unspecified: Secondary | ICD-10-CM

## 2012-11-16 DIAGNOSIS — R4789 Other speech disturbances: Secondary | ICD-10-CM | POA: Insufficient documentation

## 2012-11-16 DIAGNOSIS — R062 Wheezing: Secondary | ICD-10-CM | POA: Insufficient documentation

## 2012-11-16 LAB — CBC
HCT: 40.4 % (ref 39.0–52.0)
MCHC: 34.2 g/dL (ref 30.0–36.0)
Platelets: 181 10*3/uL (ref 150–400)
RDW: 17.6 % — ABNORMAL HIGH (ref 11.5–15.5)
WBC: 8.3 10*3/uL (ref 4.0–10.5)

## 2012-11-16 LAB — COMPREHENSIVE METABOLIC PANEL
ALT: 117 U/L — ABNORMAL HIGH (ref 0–53)
AST: 171 U/L — ABNORMAL HIGH (ref 0–37)
Albumin: 3.5 g/dL (ref 3.5–5.2)
Alkaline Phosphatase: 77 U/L (ref 39–117)
BUN: 13 mg/dL (ref 6–23)
Chloride: 101 mEq/L (ref 96–112)
Potassium: 4.1 mEq/L (ref 3.5–5.1)
Sodium: 139 mEq/L (ref 135–145)
Total Bilirubin: 0.5 mg/dL (ref 0.3–1.2)
Total Protein: 7.3 g/dL (ref 6.0–8.3)

## 2012-11-16 LAB — ETHANOL: Alcohol, Ethyl (B): 359 mg/dL — ABNORMAL HIGH (ref 0–11)

## 2012-11-16 LAB — RAPID URINE DRUG SCREEN, HOSP PERFORMED
Amphetamines: NOT DETECTED
Barbiturates: NOT DETECTED
Opiates: NOT DETECTED
Tetrahydrocannabinol: NOT DETECTED

## 2012-11-16 LAB — GLUCOSE, CAPILLARY: Glucose-Capillary: 104 mg/dL — ABNORMAL HIGH (ref 70–99)

## 2012-11-16 MED ORDER — THIAMINE HCL 100 MG/ML IJ SOLN
100.0000 mg | Freq: Every day | INTRAMUSCULAR | Status: DC
  Administered 2012-11-16: 100 mg via INTRAVENOUS
  Filled 2012-11-16: qty 2

## 2012-11-16 MED ORDER — LORAZEPAM 2 MG/ML IJ SOLN
1.0000 mg | Freq: Four times a day (QID) | INTRAMUSCULAR | Status: DC | PRN
  Administered 2012-11-16: 1 mg via INTRAVENOUS
  Filled 2012-11-16 (×2): qty 1

## 2012-11-16 MED ORDER — FOLIC ACID 1 MG PO TABS
1.0000 mg | ORAL_TABLET | Freq: Every day | ORAL | Status: DC
  Administered 2012-11-16: 1 mg via ORAL
  Filled 2012-11-16: qty 1

## 2012-11-16 MED ORDER — LORAZEPAM 1 MG PO TABS
1.0000 mg | ORAL_TABLET | Freq: Four times a day (QID) | ORAL | Status: DC | PRN
Start: 2012-11-16 — End: 2012-11-17

## 2012-11-16 MED ORDER — LORAZEPAM 2 MG/ML IJ SOLN
1.0000 mg | Freq: Once | INTRAMUSCULAR | Status: AC
  Administered 2012-11-16: 1 mg via INTRAVENOUS

## 2012-11-16 MED ORDER — ADULT MULTIVITAMIN W/MINERALS CH
1.0000 | ORAL_TABLET | Freq: Every day | ORAL | Status: DC
  Administered 2012-11-16: 1 via ORAL
  Filled 2012-11-16: qty 1

## 2012-11-16 MED ORDER — SODIUM CHLORIDE 0.9 % IV BOLUS (SEPSIS): 1000.0000 mL | Freq: Once | INTRAVENOUS | Status: DC

## 2012-11-16 MED ORDER — SODIUM CHLORIDE 0.9 % IV BOLUS (SEPSIS)
1000.0000 mL | Freq: Once | INTRAVENOUS | Status: AC
  Administered 2012-11-16: 1000 mL via INTRAVENOUS

## 2012-11-16 MED ORDER — VITAMIN B-1 100 MG PO TABS: 100.0000 mg | ORAL_TABLET | Freq: Every day | ORAL | Status: DC

## 2012-11-16 NOTE — ED Notes (Signed)
Lab at bedside to collect bld. 

## 2012-11-16 NOTE — ED Notes (Signed)
CBG 104 

## 2012-11-16 NOTE — ED Notes (Signed)
Lab at bedside

## 2012-11-16 NOTE — ED Notes (Signed)
Pharmacy tech at bedside to talk with pt.
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
## 2012-11-16 NOTE — ED Notes (Signed)
Pt standing at sink voiding

## 2012-11-16 NOTE — ED Notes (Signed)
MD at bedside. 
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
## 2012-11-16 NOTE — ED Notes (Signed)
Pt reports he needs an alcoholic beverage.
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
## 2012-11-16 NOTE — ED Notes (Signed)
Pt urinated in urinal

## 2012-11-17 ENCOUNTER — Encounter (HOSPITAL_COMMUNITY): Payer: Self-pay | Admitting: *Deleted

## 2012-11-17 NOTE — ED Provider Notes (Signed)
Patient awake, able to ambulate. Noted to be tachycardic. Patient requesting to leave, instructed that he should stay and be treated for his tachycardia. Patient refuses. Patient counseled on concern for leaving AMA. He repeats back to me that he knows that he may die or suffer other ill effects by leaving against medical device.  Olivia Mackie, MD 11/17/12 [phone]

## 2012-11-17 NOTE — ED Notes (Signed)
Pt denies SI/HI/AVH. Pt eye contact is brief. Affect is flat. Pt states that he drinks two 1/5 daily. Pt woke up and asked what happened. Informed him that he was dropped off at the ED, therefore we kept him for observation since we could not get in touch with any relatives. Pt states that he does not want detox. Pt states that he wants to leave. MD contacted. Discharge orders placed.

## 2012-11-17 NOTE — ED Notes (Signed)
Informed MD about pt's vitals. She is suggesting that the patient stay for treatment until normal values are reached. Pt is refusing. Pt is requesting to leave AMA.

## 2012-11-21 NOTE — ED Provider Notes (Signed)
Medical screening examination/treatment/procedure(s) were performed by non-physician practitioner and as supervising physician I was immediately available for consultation/collaboration.  Tiffanni Scarfo R. Jakalyn Kratky, MD 11/21/12 1602 

## 2013-02-15 ENCOUNTER — Observation Stay (HOSPITAL_COMMUNITY)
Admission: EM | Admit: 2013-02-15 | Discharge: 2013-02-16 | Disposition: A | Discharge status: Home / Self Care | Payer: Medicare Other | Attending: Internal Medicine | Admitting: Internal Medicine

## 2013-02-15 ENCOUNTER — Encounter (HOSPITAL_COMMUNITY): Payer: Self-pay | Admitting: Family Medicine

## 2013-02-15 ENCOUNTER — Emergency Department (HOSPITAL_COMMUNITY): Payer: Medicare Other

## 2013-02-15 DIAGNOSIS — I4891 Unspecified atrial fibrillation: Principal | ICD-10-CM | POA: Diagnosis present

## 2013-02-15 DIAGNOSIS — I509 Heart failure, unspecified: Secondary | ICD-10-CM | POA: Insufficient documentation

## 2013-02-15 DIAGNOSIS — B1920 Unspecified viral hepatitis C without hepatic coma: Secondary | ICD-10-CM | POA: Insufficient documentation

## 2013-02-15 DIAGNOSIS — Z91148 Patient's other noncompliance with medication regimen for other reason: Secondary | ICD-10-CM

## 2013-02-15 DIAGNOSIS — Z87891 Personal history of nicotine dependence: Secondary | ICD-10-CM | POA: Insufficient documentation

## 2013-02-15 DIAGNOSIS — I4892 Unspecified atrial flutter: Secondary | ICD-10-CM | POA: Diagnosis present

## 2013-02-15 DIAGNOSIS — E0590 Thyrotoxicosis, unspecified without thyrotoxic crisis or storm: Secondary | ICD-10-CM | POA: Insufficient documentation

## 2013-02-15 DIAGNOSIS — Z79899 Other long term (current) drug therapy: Secondary | ICD-10-CM | POA: Insufficient documentation

## 2013-02-15 DIAGNOSIS — Z9119 Patient's noncompliance with other medical treatment and regimen: Secondary | ICD-10-CM | POA: Insufficient documentation

## 2013-02-15 DIAGNOSIS — R42 Dizziness and giddiness: Secondary | ICD-10-CM | POA: Diagnosis present

## 2013-02-15 DIAGNOSIS — Z9114 Patient's other noncompliance with medication regimen: Secondary | ICD-10-CM

## 2013-02-15 DIAGNOSIS — Z91199 Patient's noncompliance with other medical treatment and regimen due to unspecified reason: Secondary | ICD-10-CM | POA: Insufficient documentation

## 2013-02-15 DIAGNOSIS — F1010 Alcohol abuse, uncomplicated: Secondary | ICD-10-CM | POA: Diagnosis present

## 2013-02-15 DIAGNOSIS — I10 Essential (primary) hypertension: Secondary | ICD-10-CM | POA: Diagnosis present

## 2013-02-15 LAB — TSH: TSH: 0.201 u[IU]/mL — ABNORMAL LOW (ref 0.350–4.500)

## 2013-02-15 LAB — COMPREHENSIVE METABOLIC PANEL
AST: 349 U/L — ABNORMAL HIGH (ref 0–37)
Albumin: 3.5 g/dL (ref 3.5–5.2)
Calcium: 9.1 mg/dL (ref 8.4–10.5)
Chloride: 98 mEq/L (ref 96–112)
Creatinine, Ser: 0.95 mg/dL (ref 0.50–1.35)

## 2013-02-15 LAB — LIPID PANEL
Cholesterol: 210 mg/dL — ABNORMAL HIGH (ref 0–200)
HDL: 75 mg/dL (ref >39)
Total CHOL/HDL Ratio: 2.8 RATIO
Triglycerides: 70 mg/dL (ref <150)

## 2013-02-15 LAB — TROPONIN I: Troponin I: <0.3 ng/mL (ref <0.30)

## 2013-02-15 LAB — CBC
HCT: 40.3 % (ref 39.0–52.0)
HCT: 38.6 % — ABNORMAL LOW (ref 39.0–52.0)
Hemoglobin: 14.4 g/dL (ref 13.0–17.0)
MCH: 27.7 pg (ref 26.0–34.0)
MCHC: 35.5 g/dL (ref 30.0–36.0)
MCV: 77.5 fL — ABNORMAL LOW (ref 78.0–100.0)
MCV: 77.4 fL — ABNORMAL LOW (ref 78.0–100.0)
RBC: 5.2 MIL/uL (ref 4.22–5.81)
RDW: 16.6 % — ABNORMAL HIGH (ref 11.5–15.5)

## 2013-02-15 LAB — ETHANOL: Alcohol, Ethyl (B): 77 mg/dL — ABNORMAL HIGH (ref 0–11)

## 2013-02-15 LAB — PHENYTOIN LEVEL, TOTAL: Phenytoin Lvl: <2.5 ug/mL — ABNORMAL LOW (ref 10.0–20.0)

## 2013-02-15 LAB — RAPID URINE DRUG SCREEN, HOSP PERFORMED
Amphetamines: NOT DETECTED
Tetrahydrocannabinol: NOT DETECTED

## 2013-02-15 MED ORDER — PHENYTOIN SODIUM EXTENDED 100 MG PO CAPS
100.0000 mg | ORAL_CAPSULE | Freq: Every day | ORAL | Status: DC
  Administered 2013-02-15 – 2013-02-16 (×2): 100 mg via ORAL
  Filled 2013-02-15 (×2): qty 1

## 2013-02-15 MED ORDER — SODIUM CHLORIDE 0.9 % IJ SOLN: 3.0000 mL | INTRAMUSCULAR | Status: DC | PRN

## 2013-02-15 MED ORDER — VITAMIN B-1 100 MG PO TABS
100.0000 mg | ORAL_TABLET | Freq: Every day | ORAL | Status: DC
  Administered 2013-02-15 – 2013-02-16 (×2): 100 mg via ORAL
  Filled 2013-02-15 (×2): qty 1

## 2013-02-15 MED ORDER — SODIUM CHLORIDE 0.9 % IJ SOLN: 3.0000 mL | Freq: Two times a day (BID) | INTRAMUSCULAR | Status: DC

## 2013-02-15 MED ORDER — DILTIAZEM HCL 25 MG/5ML IV SOLN
20.0000 mg | Freq: Once | INTRAVENOUS | Status: AC
  Administered 2013-02-15: 20 mg via INTRAVENOUS

## 2013-02-15 MED ORDER — DILTIAZEM HCL 100 MG IV SOLR
5.0000 mg/h | Freq: Once | INTRAVENOUS | Status: AC
  Administered 2013-02-15: 5 mg/h via INTRAVENOUS

## 2013-02-15 MED ORDER — FOLIC ACID 1 MG PO TABS
1.0000 mg | ORAL_TABLET | Freq: Every day | ORAL | Status: DC
  Administered 2013-02-15 – 2013-02-16 (×2): 1 mg via ORAL
  Filled 2013-02-15 (×2): qty 1

## 2013-02-15 MED ORDER — ASPIRIN EC 81 MG PO TBEC
81.0000 mg | DELAYED_RELEASE_TABLET | Freq: Every day | ORAL | Status: DC
  Administered 2013-02-16: 81 mg via ORAL
  Filled 2013-02-15 (×2): qty 1

## 2013-02-15 MED ORDER — LORAZEPAM 1 MG PO TABS
1.0000 mg | ORAL_TABLET | Freq: Four times a day (QID) | ORAL | Status: DC | PRN
  Administered 2013-02-15: 1 mg via ORAL
  Filled 2013-02-15: qty 1

## 2013-02-15 MED ORDER — SODIUM CHLORIDE 0.9 % IV SOLN: 250.0000 mL | INTRAVENOUS | Status: DC | PRN

## 2013-02-15 MED ORDER — ACETAMINOPHEN 650 MG RE SUPP: 650.0000 mg | Freq: Four times a day (QID) | RECTAL | Status: DC | PRN

## 2013-02-15 MED ORDER — SODIUM CHLORIDE 0.9 % IV SOLN
INTRAVENOUS | Status: DC
  Administered 2013-02-16: via INTRAVENOUS

## 2013-02-15 MED ORDER — THIAMINE HCL 100 MG/ML IJ SOLN
100.0000 mg | Freq: Every day | INTRAMUSCULAR | Status: DC
  Filled 2013-02-15 (×2): qty 1

## 2013-02-15 MED ORDER — DILTIAZEM HCL 100 MG IV SOLR
10.0000 mg/h | Freq: Once | INTRAVENOUS | Status: AC
  Administered 2013-02-15: 10 mg/h via INTRAVENOUS

## 2013-02-15 MED ORDER — LORAZEPAM 2 MG/ML IJ SOLN: 1.0000 mg | Freq: Four times a day (QID) | INTRAMUSCULAR | Status: DC | PRN

## 2013-02-15 MED ORDER — ACETAMINOPHEN 325 MG PO TABS: 650.0000 mg | ORAL_TABLET | Freq: Four times a day (QID) | ORAL | Status: DC | PRN

## 2013-02-15 MED ORDER — LORAZEPAM 2 MG/ML IJ SOLN
2.0000 mg | Freq: Once | INTRAMUSCULAR | Status: AC
  Administered 2013-02-15: 2 mg via INTRAVENOUS
  Filled 2013-02-15: qty 1

## 2013-02-15 MED ORDER — DILTIAZEM HCL 30 MG PO TABS
30.0000 mg | ORAL_TABLET | Freq: Four times a day (QID) | ORAL | Status: DC
  Administered 2013-02-15 – 2013-02-16 (×4): 30 mg via ORAL
  Filled 2013-02-15 (×7): qty 1

## 2013-02-15 MED ORDER — HEPARIN SODIUM (PORCINE) 5000 UNIT/ML IJ SOLN
5000.0000 [IU] | Freq: Three times a day (TID) | INTRAMUSCULAR | Status: DC
  Administered 2013-02-15 – 2013-02-16 (×2): 5000 [IU] via SUBCUTANEOUS
  Filled 2013-02-15 (×6): qty 1

## 2013-02-15 MED ORDER — ADULT MULTIVITAMIN W/MINERALS CH
1.0000 | ORAL_TABLET | Freq: Every day | ORAL | Status: DC
  Administered 2013-02-15 – 2013-02-16 (×2): 1 via ORAL
  Filled 2013-02-15 (×2): qty 1

## 2013-02-15 MED ORDER — SODIUM CHLORIDE 0.9 % IJ SOLN
3.0000 mL | Freq: Two times a day (BID) | INTRAMUSCULAR | Status: DC
  Administered 2013-02-15 – 2013-02-16 (×2): 3 mL via INTRAVENOUS

## 2013-02-15 NOTE — Progress Notes (Deleted)
12 fr NG tube inserted for the installation of 240 mL of barium. Tube then d/ced.

## 2013-02-15 NOTE — ED Provider Notes (Addendum)
History     CSN: [id number]  Arrival date & time 02/15/13  1119   First MD Initiated Contact with Patient 02/15/13 1119      Chief Complaint  Patient presents with  . Alcohol Problem    (Consider location/radiation/quality/duration/timing/severity/associated sxs/prior treatment) Patient is a 62 y.o. male presenting with alcohol problem. The history is provided by the patient.  Alcohol Problem Pertinent negatives include no chest pain, no abdominal pain, no headaches and no shortness of breath.  pt with hx etoh abuse, ems was called out re altered mental status. Pt was awake/alert on their arrival, states he had drank approximately a fifth of liquor today. Pt states drinks daily. Denies wanting to stop drinking or wanting rehab/detox.   Denies depression or thoughts of self harm. Denies trauma or fall. Denies pain or injury. States recent period of incarceration so not taking his normal meds. Pt does note that all day today has felt dizzy, lightheaded. Denies syncope. Denies cp. Mild sob. Denies palpitations. Hx afib.     Past Medical History  Diagnosis Date  . CHF (congestive heart failure)   . Alcohol abuse   . Dysrhythmia     paroxismal atrial fib  . Seizure   . Paroxysmal Atrial Flutter 02/01/2012  . Noncompliance with medication treatment due to intermittent use of medication 02/02/2012  . HTN (hypertension) 02/02/2012  . Hepatitis C 02/02/2012  . Alcohol withdrawal seizure 02/02/2012  . Hypertension   . Hepatitis C     Past Surgical History  Procedure Laterality Date  . Forearm reconstruction      Family History  Problem Relation Age of Onset  . Heart disease Father   . Alcohol abuse Father   . Alcohol abuse Brother   . Diabetes type II Mother     History  Substance Use Topics  . Smoking status: Never Smoker   . Smokeless tobacco: Not on file  . Alcohol Use: Yes     Comment: statrts drinking everyday at 5am drinks about 2 fiths been drinking for about 10 years       Review of Systems  Constitutional: Negative for fever.  HENT: Negative for neck pain.   Eyes: Negative for visual disturbance.  Respiratory: Negative for cough and shortness of breath.   Cardiovascular: Negative for chest pain.  Gastrointestinal: Negative for vomiting, abdominal pain, diarrhea and blood in stool.  Genitourinary: Negative for flank pain.  Musculoskeletal: Negative for back pain.  Skin: Negative for rash.  Neurological: Negative for headaches.  Hematological: Does not bruise/bleed easily.  Psychiatric/Behavioral: Negative for confusion.    Allergies  Review of patient's allergies indicates no known allergies.  Home Medications   Current Outpatient Rx  Name  Route  Sig  Dispense  Refill  . HYDROcodone-acetaminophen (NORCO/VICODIN) 5-325 MG per tablet   Oral   Take 1 tablet by mouth every 6 (six) hours as needed. For pain         . phenytoin (DILANTIN) 100 MG ER capsule   Oral   Take 100 mg by mouth daily.           There were no vitals taken for this visit.  Physical Exam  Nursing note and vitals reviewed. Constitutional: He appears well-developed and well-nourished.  Markedly tachycardic.   HENT:  Head: Atraumatic.  Mouth/Throat: Oropharynx is clear and moist.  Eyes: Pupils are equal, round, and reactive to light. No scleral icterus.  Neck: Neck supple. No tracheal deviation present.  Cardiovascular: Normal heart sounds and  intact distal pulses.   Markedly tachycardic, ?irregular  Pulmonary/Chest: Effort normal and breath sounds normal. No accessory muscle usage. No respiratory distress.  Abdominal: Soft. Bowel sounds are normal. He exhibits no distension. There is no tenderness.  Musculoskeletal: Normal range of motion. He exhibits no edema and no tenderness.  Neurological: He is alert.  Alert, oriented to person/place. Motor intact bil.   Skin: Skin is warm and dry.  Psychiatric:  Intoxicated.     ED Course  Procedures (including  critical care time)  Results for orders placed during the hospital encounter of 02/15/13  COMPREHENSIVE METABOLIC PANEL      Result Value Range   Sodium 138  135 - 145 mEq/L   Potassium 4.4  3.5 - 5.1 mEq/L   Chloride 98  96 - 112 mEq/L   CO2 21  19 - 32 mEq/L   Glucose, Bld 95  70 - 99 mg/dL   BUN 11  6 - 23 mg/dL   Creatinine, Ser 4.54  0.50 - 1.35 mg/dL   Calcium 9.1  8.4 - 09.8 mg/dL   Total Protein 7.5  6.0 - 8.3 g/dL   Albumin 3.5  3.5 - 5.2 g/dL   AST 119 (*) 0 - 37 U/L   ALT 234 (*) 0 - 53 U/L   Alkaline Phosphatase 100  39 - 117 U/L   Total Bilirubin 1.1  0.3 - 1.2 mg/dL   GFR calc non Af Amer 87 (*) >90 mL/min   GFR calc Af Amer >90  >90 mL/min  CBC      Result Value Range   WBC 11.7 (*) 4.0 - 10.5 K/uL   RBC 5.20  4.22 - 5.81 MIL/uL   Hemoglobin 14.4  13.0 - 17.0 g/dL   HCT 14.7  82.9 - 56.2 %   MCV 77.5 (*) 78.0 - 100.0 fL   MCH 27.7  26.0 - 34.0 pg   MCHC 35.7  30.0 - 36.0 g/dL   RDW 13.0 (*) 86.5 - 78.4 %   Platelets 178  150 - 400 K/uL  ETHANOL      Result Value Range   Alcohol, Ethyl (B) 77 (*) 0 - 11 mg/dL  PHENYTOIN LEVEL, TOTAL      Result Value Range   Phenytoin Lvl <2.5 (*) 10.0 - 20.0 ug/mL        MDM  Labs.  Reviewed nursing notes and prior charts for additional history.   Monitor. afib w rvr.  Hx same.  cardizem bolus/gtt.    Date: 02/15/2013  Rate: 190  Rhythm: atrial fibrillation and w rvr  QRS Axis: right  Intervals: normal  ST/T Wave abnormalities: nonspecific ST/T changes  Conduction Disutrbances:nonspecific intraventricular conduction delay  Narrative Interpretation:   Old EKG Reviewed: unchanged  Recheck hr 140-150, afib.  rebolus cardizem, increase gtt to 10 mg/hr.   Ativan 1 mg iv.   Recheck hr 120. Med service called to admit.  CRITICAL CARE Performed by: Suzi Roots   Total critical care time: 40  Critical care time was exclusive of separately billable procedures and treating other patients.  Critical  care was necessary to treat or prevent imminent or life-threatening deterioration.  Critical care was time spent personally by me on the following activities: development of treatment plan with patient and/or surrogate as well as nursing, discussions with consultants, evaluation of patient's response to treatment, examination of patient, obtaining history from patient or surrogate, ordering and performing treatments and interventions, ordering and review of laboratory studies,  ordering and review of radiographic studies, pulse oximetry and re-evaluation of patient's condition.     Suzi Roots, MD 02/15/13 1126  Suzi Roots, MD 02/15/13 [phone]

## 2013-02-15 NOTE — ED Notes (Signed)
Admitting MD at bedside.

## 2013-02-15 NOTE — Progress Notes (Deleted)
DISREGARD PREVIOUS NOTE, NO NG INSERTED IN THIS PATIENT
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
## 2013-02-15 NOTE — ED Notes (Signed)
Per EMS, pt had a 40 oz this am and now he is dizzy. Per pt sts drink 2 fifths a day. Pt recently released from jail and hasn't had any of his meds.

## 2013-02-16 DIAGNOSIS — I4891 Unspecified atrial fibrillation: Secondary | ICD-10-CM

## 2013-02-16 LAB — BASIC METABOLIC PANEL
Chloride: 102 mEq/L (ref 96–112)
GFR calc Af Amer: >90 mL/min (ref >90)
Potassium: 3.7 mEq/L (ref 3.5–5.1)
Sodium: 139 mEq/L (ref 135–145)

## 2013-02-16 MED ORDER — ADULT MULTIVITAMIN W/MINERALS CH: 1.0000 | ORAL_TABLET | Freq: Every day | ORAL | Status: DC

## 2013-02-16 MED ORDER — DILTIAZEM HCL ER 120 MG PO CP24: 120.0000 mg | ORAL_CAPSULE | Freq: Every day | ORAL | Status: DC

## 2013-02-16 MED ORDER — ASPIRIN 81 MG PO TBEC: 81.0000 mg | DELAYED_RELEASE_TABLET | Freq: Every day | ORAL | Status: DC

## 2013-02-16 MED ORDER — FOLIC ACID 1 MG PO TABS: 1.0000 mg | ORAL_TABLET | Freq: Every day | ORAL | Status: DC

## 2013-02-16 NOTE — Progress Notes (Signed)
Notified of pts increased HR 150's. Pt asleep in bed. Pt HR decreased 90's within minutes. Will continue to monitor.

## 2013-02-16 NOTE — Progress Notes (Signed)
Utilization review completed. Bertha Stanfill, RN, BSN. 

## 2013-02-16 NOTE — Progress Notes (Addendum)
Subjective: EKG this AM with NSR. He denies chest pain or shortness of breath. He has no complaints today. No signs of alcohol withdrawal today. He received Ativan last night but this made him somnolent; he slept well overnight. Patient reports that he has a [employer] streamer already arranging classes and plan for alcohol detox.   Objective: Vital signs in last 24 hours: Filed Vitals:   02/16/13 0013 02/16/13 0346 02/16/13 0553 02/16/13 1000  BP: 146/74 156/84 145/87 149/90  Pulse: 80 86 87 88  Temp:  98.3 F (36.8 C)  98.3 F (36.8 C)  TempSrc:  Oral    Resp: 20 20  18   Height:      Weight:  201 lb 6.4 oz (91.354 kg)    SpO2: 96% 100%  100%   Weight change:   Intake/Output Summary (Last 24 hours) at 02/16/13 1110 Last data filed at 02/16/13 1021  Gross per 24 hour  Intake    406 ml  Output    725 ml  Net   -319 ml   Vitals reviewed.  General: Sitting in bed, in NAD  HEENT: PERRL, EOMI, no scleral icterus, red conjuctiva  Cardiac: RRR, no rubs, murmurs or gallops  Pulm: clear to auscultation bilaterally, no wheezes, rales, or rhonchi  Abd: soft, nontender, nondistended, BS present. Liver span 3-4 finger breaths below costal margin  Ext: warm and well perfused, no pedal edema  MSK: Right knee with multiple scars healing well with granulation tissue.  Neuro: Mild dysarthria. alert and oriented X3, cranial nerves II-XII grossly intact, strength and sensation to light touch equal in bilateral upper and lower extremities. Bilateral fine tremors of hands, no asterixis  Lab Results: Basic Metabolic Panel:  Recent Labs Lab 02/15/13 1123 02/15/13 1734 02/16/13 0607  NA 138  --  139  K 4.4  --  3.7  CL 98  --  102  CO2 21  --  27  GLUCOSE 95  --  94  BUN 11  --  12  CREATININE 0.95 0.80 0.80  CALCIUM 9.1  --  8.7  MG  --  1.5  --   PHOS  --  3.8  --    Liver Function Tests:  Recent Labs Lab 02/15/13 1123  AST 349*  ALT 234*  ALKPHOS 100  BILITOT 1.1  PROT 7.5   ALBUMIN 3.5   CBC:  Recent Labs Lab 02/15/13 1123 02/15/13 1734  WBC 11.7* 10.0  HGB 14.4 13.7  HCT 40.3 38.6*  MCV 77.5* 77.4*  PLT 178 152   Cardiac Enzymes:  Recent Labs Lab 02/15/13 1735  TROPONINI <0.30   Fasting Lipid Panel:  Recent Labs Lab 02/15/13 1735  CHOL 210*  HDL 75  LDLCALC 121*  TRIG 70  CHOLHDL 2.8   Thyroid Function Tests:  Recent Labs Lab 02/15/13 1734  TSH 0.201*   Urine Drug Screen: Drugs of Abuse     Component Value Date/Time   LABOPIA NONE DETECTED 02/15/2013 1235   COCAINSCRNUR NONE DETECTED 02/15/2013 1235   LABBENZ NONE DETECTED 02/15/2013 1235   AMPHETMU NONE DETECTED 02/15/2013 1235   THCU NONE DETECTED 02/15/2013 1235   LABBARB NONE DETECTED 02/15/2013 1235    Alcohol Level:  Recent Labs Lab 02/15/13 1123  ETH 77*    Studies/Results: Dg Chest Port 1 View  02/15/2013  *RADIOLOGY REPORT*  Clinical Data: Dizziness.  Shortness of breath.  PORTABLE CHEST - 1 VIEW  Comparison: 02/01/2012  Findings: Artifact overlies chest.  Heart size  is at the upper limits of normal.  The lungs are clear.  The vascularity is normal. No effusions.  No acute bony finding.  IMPRESSION: No active disease   Original Report Authenticated By: Paulina Fusi, M.D.    Medications: I have reviewed the patient's current medications. Scheduled Meds: . aspirin EC  81 mg Oral Daily  . diltiazem  30 mg Oral Q6H  . folic acid  1 mg Oral Daily  . heparin  5,000 Units Subcutaneous Q8H  . multivitamin with minerals  1 tablet Oral Daily  . phenytoin  100 mg Oral Daily  . sodium chloride  3 mL Intravenous Q12H  . sodium chloride  3 mL Intravenous Q12H  . thiamine  100 mg Oral Daily   Or  . thiamine  100 mg Intravenous Daily   Continuous Infusions: . sodium chloride 100 mL/hr at 02/16/13 0015   PRN Meds:.sodium chloride, acetaminophen, acetaminophen, LORazepam, LORazepam, sodium chloride Assessment/Plan: Atrial fibrillation with RVR. Resolved now. Likely provoked  by ongoing chronic, heavy alcohol use but could also be 2/2 hyperthyroidism. Unclear duration but he has had paroxysmal A.fib in the past and was seen by [facility] as outpatient with no follow up in one year. He is encouraged to follow up with them again. He is not a good candidate for anticoagulation therapy. His heart rate was increased to 130 with movement/agitation but mostly in 80s at rest. EKG this AM with NSR.  -Continue telemetry -Cardizem 30mg  PO x4 daily  -TSH low at 0.2, he will need follow up for possible initiation of methimazole therapy.  -Lipid profile with LDL of 121, his goal is <100, will not start statin given chronic alcohol use and elevated LFTs.  Dizziness. This is likely secondary to A.fib with RVR which is likely secondary to his alcohol abuse. Orthostatic hypotension also a possibility. Last 2D echo with normal EF. He is euvolemic on physical exam. -IVF, NS 114ml/hr  -A. Fib treatment per above.  -CIWA protocol per below   Alcohol abuse. He reports a 10 year history of heavy drinking with up to 2 5hts of liquor per day. He has history of alcohol withdrawal seizures after not drinking for one day. He reports that his parole officer already has detox classes that he will attend as they are mandatory for compliance of his parole. LFTs elevated, with AST of 349, ALT of 234, t bili normal but patient also carries diagnoses of hepatitis C. No signs of withdrawal today.  -CIWA protocol  -CSW consult   HTN. BP elevated to 140s/90s overnight.  -Cardizem 30mg  PO x4 daily per above.  -Will discharge him with Diltiazem 120mg  ER daily. (no BB given history of cocaine use)  Hyperthyroidism. TSH of 0.201. Will check fT3 and fT4. Will not start methimazole treatment at this time. Patient to follow up at the [facility] for this. No BB given history of cocaine use.   DVT prophylaxis: heparin TID   FEN  NS 150ml/hr  Nl electrolytes but replace as needed  Dispo: Disposition is  deferred at this time, awaiting improvement of current medical problems.  Anticipated discharge is today.   The patient does not have a current PCP, therefore will be requiring OPC follow-up after discharge. One time [facility] visit. Only. He will be provided a list of PCPs upon his discharge.    The patient does have transportation limitations that hinder transportation to clinic appointments.  .Services Needed at time of discharge: Y = Yes, Blank = No PT:  OT:   RN:   Equipment:   Other:     LOS: 1 day   Sara Chu D 02/16/2013, 11:10 AM
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
## 2013-02-20 ENCOUNTER — Ambulatory Visit: Payer: Medicare Other | Admitting: Internal Medicine

## 2013-05-06 ENCOUNTER — Emergency Department (HOSPITAL_COMMUNITY): Payer: Medicare Other

## 2013-05-06 ENCOUNTER — Emergency Department (HOSPITAL_COMMUNITY)
Admission: EM | Admit: 2013-05-06 | Discharge: 2013-05-06 | Disposition: A | Discharge status: Home / Self Care | Payer: Medicare Other | Attending: Emergency Medicine | Admitting: Emergency Medicine

## 2013-05-06 ENCOUNTER — Encounter (HOSPITAL_COMMUNITY): Payer: Self-pay | Admitting: Emergency Medicine

## 2013-05-06 DIAGNOSIS — F10939 Alcohol use, unspecified with withdrawal, unspecified: Secondary | ICD-10-CM | POA: Insufficient documentation

## 2013-05-06 DIAGNOSIS — Z8619 Personal history of other infectious and parasitic diseases: Secondary | ICD-10-CM | POA: Insufficient documentation

## 2013-05-06 DIAGNOSIS — F1010 Alcohol abuse, uncomplicated: Secondary | ICD-10-CM | POA: Insufficient documentation

## 2013-05-06 DIAGNOSIS — Z7982 Long term (current) use of aspirin: Secondary | ICD-10-CM | POA: Insufficient documentation

## 2013-05-06 DIAGNOSIS — Z79899 Other long term (current) drug therapy: Secondary | ICD-10-CM | POA: Insufficient documentation

## 2013-05-06 DIAGNOSIS — F10239 Alcohol dependence with withdrawal, unspecified: Secondary | ICD-10-CM | POA: Insufficient documentation

## 2013-05-06 DIAGNOSIS — F10929 Alcohol use, unspecified with intoxication, unspecified: Secondary | ICD-10-CM

## 2013-05-06 DIAGNOSIS — G40909 Epilepsy, unspecified, not intractable, without status epilepticus: Secondary | ICD-10-CM | POA: Insufficient documentation

## 2013-05-06 DIAGNOSIS — I509 Heart failure, unspecified: Secondary | ICD-10-CM | POA: Insufficient documentation

## 2013-05-06 DIAGNOSIS — Z8679 Personal history of other diseases of the circulatory system: Secondary | ICD-10-CM | POA: Insufficient documentation

## 2013-05-06 DIAGNOSIS — R4182 Altered mental status, unspecified: Secondary | ICD-10-CM | POA: Insufficient documentation

## 2013-05-06 DIAGNOSIS — I10 Essential (primary) hypertension: Secondary | ICD-10-CM | POA: Insufficient documentation

## 2013-05-06 DIAGNOSIS — R5381 Other malaise: Secondary | ICD-10-CM | POA: Insufficient documentation

## 2013-05-06 DIAGNOSIS — R5383 Other fatigue: Secondary | ICD-10-CM | POA: Insufficient documentation

## 2013-05-06 LAB — GLUCOSE, CAPILLARY: Glucose-Capillary: 88 mg/dL (ref 70–99)

## 2013-05-06 MED ORDER — SODIUM CHLORIDE 0.9 % IV SOLN: 1000.0000 mL | INTRAVENOUS | Status: DC

## 2013-05-06 MED ORDER — NALOXONE HCL 0.4 MG/ML IJ SOLN
INTRAMUSCULAR | Status: AC
  Filled 2013-05-06: qty 1

## 2013-05-06 MED ORDER — SODIUM CHLORIDE 0.9 % IV SOLN
1000.0000 mL | Freq: Once | INTRAVENOUS | Status: AC
  Administered 2013-05-06: 1000 mL via INTRAVENOUS

## 2013-05-06 MED ORDER — NALOXONE HCL 1 MG/ML IJ SOLN: 1.0000 mg | INTRAMUSCULAR | Status: DC | PRN

## 2013-05-06 MED ORDER — NALOXONE HCL 0.4 MG/ML IJ SOLN
INTRAMUSCULAR | Status: AC
  Administered 2013-05-06: 1 mg
  Filled 2013-05-06: qty 2
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
## 2013-05-06 NOTE — ED Notes (Signed)
Patient left AMA after becoming very agitated, pulled all monitor leads off , cursing security notified. Patient was escorted to the front of the hospital

## 2013-05-06 NOTE — ED Notes (Signed)
Patient very unruly climbing off the bed. Pulling everything off. Cursing states I am leaving. Patient very agitated Security called and patient escorted to the front of hospital.

## 2013-05-06 NOTE — ED Notes (Signed)
MD Ignacia Palma at bedside for evaluation.

## 2013-05-06 NOTE — ED Notes (Signed)
Pt found slumped over at [place] by EMS.  Pt was not alert at the time only responsive to pain when found.  No incontinence noted by EMS.  Pt not able to respond verbally on arrival to the ED.

## 2013-07-19 ENCOUNTER — Emergency Department (HOSPITAL_COMMUNITY)
Admission: EM | Admit: 2013-07-19 | Discharge: 2013-07-20 | Disposition: A | Discharge status: Other Acute Inpatient Hospital | Payer: Medicare Other | Source: Home / Self Care | Attending: Emergency Medicine | Admitting: Emergency Medicine

## 2013-07-19 ENCOUNTER — Encounter (HOSPITAL_COMMUNITY): Payer: Self-pay | Admitting: Emergency Medicine

## 2013-07-19 DIAGNOSIS — I509 Heart failure, unspecified: Secondary | ICD-10-CM

## 2013-07-19 DIAGNOSIS — F1010 Alcohol abuse, uncomplicated: Secondary | ICD-10-CM

## 2013-07-19 DIAGNOSIS — I10 Essential (primary) hypertension: Secondary | ICD-10-CM

## 2013-07-19 LAB — RAPID URINE DRUG SCREEN, HOSP PERFORMED
Amphetamines: NOT DETECTED
Benzodiazepines: NOT DETECTED
Cocaine: NOT DETECTED
Opiates: NOT DETECTED

## 2013-07-19 LAB — CBC
MCV: 82.3 fL (ref 78.0–100.0)
Platelets: 184 10*3/uL (ref 150–400)
RDW: 17.9 % — ABNORMAL HIGH (ref 11.5–15.5)
WBC: 8.5 10*3/uL (ref 4.0–10.5)

## 2013-07-19 LAB — COMPREHENSIVE METABOLIC PANEL
AST: 241 U/L — ABNORMAL HIGH (ref 0–37)
Albumin: 3.9 g/dL (ref 3.5–5.2)
Calcium: 9.5 mg/dL (ref 8.4–10.5)
Chloride: 99 mEq/L (ref 96–112)
Creatinine, Ser: 0.84 mg/dL (ref 0.50–1.35)
Total Protein: 8.2 g/dL (ref 6.0–8.3)

## 2013-07-19 LAB — SALICYLATE LEVEL: Salicylate Lvl: <2 mg/dL — ABNORMAL LOW (ref 2.8–20.0)

## 2013-07-19 MED ORDER — PHENYTOIN SODIUM EXTENDED 100 MG PO CAPS: 100.0000 mg | ORAL_CAPSULE | Freq: Every day | ORAL | Status: DC

## 2013-07-19 MED ORDER — ASPIRIN EC 81 MG PO TBEC
81.0000 mg | DELAYED_RELEASE_TABLET | Freq: Every day | ORAL | Status: DC
  Filled 2013-07-19: qty 1

## 2013-07-19 MED ORDER — LORAZEPAM 1 MG PO TABS: 0.0000 mg | ORAL_TABLET | Freq: Four times a day (QID) | ORAL | Status: DC

## 2013-07-19 MED ORDER — THIAMINE HCL 100 MG/ML IJ SOLN: 100.0000 mg | Freq: Every day | INTRAMUSCULAR | Status: DC

## 2013-07-19 MED ORDER — DILTIAZEM HCL ER 120 MG PO CP24
120.0000 mg | ORAL_CAPSULE | Freq: Every day | ORAL | Status: DC
  Filled 2013-07-19: qty 1

## 2013-07-19 MED ORDER — FOLIC ACID 1 MG PO TABS: 1.0000 mg | ORAL_TABLET | Freq: Every day | ORAL | Status: DC

## 2013-07-19 MED ORDER — FOLIC ACID 1 MG PO TABS
1.0000 mg | ORAL_TABLET | Freq: Every day | ORAL | Status: DC
  Administered 2013-07-19: 1 mg via ORAL
  Filled 2013-07-19: qty 1

## 2013-07-19 MED ORDER — ADULT MULTIVITAMIN W/MINERALS CH: 1.0000 | ORAL_TABLET | Freq: Every day | ORAL | Status: DC

## 2013-07-19 MED ORDER — LORAZEPAM 1 MG PO TABS: 0.0000 mg | ORAL_TABLET | Freq: Two times a day (BID) | ORAL | Status: DC

## 2013-07-19 MED ORDER — ADULT MULTIVITAMIN W/MINERALS CH
1.0000 | ORAL_TABLET | Freq: Every day | ORAL | Status: DC
  Administered 2013-07-19: 1 via ORAL
  Filled 2013-07-19: qty 1

## 2013-07-19 MED ORDER — VITAMIN B-1 100 MG PO TABS
100.0000 mg | ORAL_TABLET | Freq: Every day | ORAL | Status: DC
  Administered 2013-07-19: 100 mg via ORAL
  Filled 2013-07-19: qty 1

## 2013-07-19 NOTE — ED Notes (Addendum)
Pt changed into paper scrubs. Per policy pt belongings taken from patient.   One patient valuables envelope taken to security with the contents of one stainless steel watch with a cracked and scratched face, $13.10 in cash (1-$5 bill, 8-$1 bill, 1-$0.10), and a hotel key card. Belongings are in lock box #2.   Patient belongings bag with the contents of Grape sleeve t-shirt, camo pants, undergarments, shoes, belt, and hair pick and given to Psych ED staff upon transfer to Psych ED.
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
## 2013-07-19 NOTE — ED Notes (Signed)
Pt presents today with a request for alcohol detox. Pt reports going to work today, however was sent home due to another employee reporting the smell of alcohol on the patient's breath. Pt states that he drinks 1/2 - 1 gallon of liquor per day. Pt denies HI/SI and any auditory or visual hallucinations. Pt states he has a history of trouble with alcoholism, pt reports being court ordered one month ago to attend classes on detox. Pt is A/O and is in non apparent distress.
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
## 2013-07-20 ENCOUNTER — Encounter (HOSPITAL_COMMUNITY): Payer: Self-pay

## 2013-07-20 ENCOUNTER — Inpatient Hospital Stay (HOSPITAL_COMMUNITY)
Admission: EM | Admit: 2013-07-20 | Discharge: 2013-07-24 | DRG: 897 | Disposition: A | Discharge status: Home / Self Care | Payer: Medicare Other | Source: Intra-hospital | Attending: Psychiatry | Admitting: Psychiatry

## 2013-07-20 DIAGNOSIS — F1994 Other psychoactive substance use, unspecified with psychoactive substance-induced mood disorder: Secondary | ICD-10-CM

## 2013-07-20 DIAGNOSIS — F1020 Alcohol dependence, uncomplicated: Secondary | ICD-10-CM

## 2013-07-20 DIAGNOSIS — F10939 Alcohol use, unspecified with withdrawal, unspecified: Principal | ICD-10-CM | POA: Diagnosis present

## 2013-07-20 DIAGNOSIS — I509 Heart failure, unspecified: Secondary | ICD-10-CM | POA: Diagnosis present

## 2013-07-20 DIAGNOSIS — Z79899 Other long term (current) drug therapy: Secondary | ICD-10-CM

## 2013-07-20 DIAGNOSIS — Z9119 Patient's noncompliance with other medical treatment and regimen: Secondary | ICD-10-CM

## 2013-07-20 DIAGNOSIS — Z91199 Patient's noncompliance with other medical treatment and regimen due to unspecified reason: Secondary | ICD-10-CM

## 2013-07-20 DIAGNOSIS — F10239 Alcohol dependence with withdrawal, unspecified: Principal | ICD-10-CM | POA: Diagnosis present

## 2013-07-20 DIAGNOSIS — F639 Impulse disorder, unspecified: Secondary | ICD-10-CM

## 2013-07-20 DIAGNOSIS — I10 Essential (primary) hypertension: Secondary | ICD-10-CM | POA: Diagnosis present

## 2013-07-20 DIAGNOSIS — F1010 Alcohol abuse, uncomplicated: Secondary | ICD-10-CM

## 2013-07-20 DIAGNOSIS — F1421 Cocaine dependence, in remission: Secondary | ICD-10-CM

## 2013-07-20 MED ORDER — ADULT MULTIVITAMIN W/MINERALS CH
1.0000 | ORAL_TABLET | Freq: Every day | ORAL | Status: DC
Start: 2013-07-20 — End: 2013-07-24
  Administered 2013-07-20 – 2013-07-24 (×5): 1 via ORAL
  Filled 2013-07-20 (×7): qty 1

## 2013-07-20 MED ORDER — DILTIAZEM HCL ER COATED BEADS 120 MG PO CP24
120.0000 mg | ORAL_CAPSULE | Freq: Once | ORAL | Status: AC
  Administered 2013-07-20: 120 mg via ORAL
  Filled 2013-07-20: qty 1

## 2013-07-20 MED ORDER — ONDANSETRON 4 MG PO TBDP: 4.0000 mg | ORAL_TABLET | Freq: Four times a day (QID) | ORAL | Status: AC | PRN

## 2013-07-20 MED ORDER — CHLORDIAZEPOXIDE HCL 25 MG PO CAPS
25.0000 mg | ORAL_CAPSULE | ORAL | Status: AC
  Administered 2013-07-22 – 2013-07-23 (×2): 25 mg via ORAL
  Filled 2013-07-20 (×2): qty 1

## 2013-07-20 MED ORDER — MAGNESIUM HYDROXIDE 400 MG/5ML PO SUSP: 30.0000 mL | Freq: Every day | ORAL | Status: DC | PRN

## 2013-07-20 MED ORDER — VITAMIN B-1 100 MG PO TABS
100.0000 mg | ORAL_TABLET | Freq: Every day | ORAL | Status: DC
  Administered 2013-07-21 – 2013-07-24 (×4): 100 mg via ORAL
  Filled 2013-07-20 (×6): qty 1

## 2013-07-20 MED ORDER — CHLORDIAZEPOXIDE HCL 25 MG PO CAPS
25.0000 mg | ORAL_CAPSULE | Freq: Every day | ORAL | Status: AC
  Administered 2013-07-24: 25 mg via ORAL
  Filled 2013-07-20: qty 1

## 2013-07-20 MED ORDER — ACETAMINOPHEN 325 MG PO TABS
650.0000 mg | ORAL_TABLET | Freq: Four times a day (QID) | ORAL | Status: DC | PRN
  Administered 2013-07-20: 650 mg via ORAL

## 2013-07-20 MED ORDER — CHLORDIAZEPOXIDE HCL 25 MG PO CAPS
25.0000 mg | ORAL_CAPSULE | Freq: Three times a day (TID) | ORAL | Status: AC
  Administered 2013-07-21 – 2013-07-22 (×3): 25 mg via ORAL
  Filled 2013-07-20 (×2): qty 1

## 2013-07-20 MED ORDER — TRAZODONE HCL 50 MG PO TABS
50.0000 mg | ORAL_TABLET | Freq: Every evening | ORAL | Status: DC | PRN
  Administered 2013-07-21 – 2013-07-23 (×3): 50 mg via ORAL
  Filled 2013-07-20 (×12): qty 1

## 2013-07-20 MED ORDER — HYDROXYZINE HCL 25 MG PO TABS: 25.0000 mg | ORAL_TABLET | Freq: Four times a day (QID) | ORAL | Status: AC | PRN

## 2013-07-20 MED ORDER — LOPERAMIDE HCL 2 MG PO CAPS: 2.0000 mg | ORAL_CAPSULE | ORAL | Status: AC | PRN

## 2013-07-20 MED ORDER — ALUM & MAG HYDROXIDE-SIMETH 200-200-20 MG/5ML PO SUSP: 30.0000 mL | ORAL | Status: DC | PRN

## 2013-07-20 MED ORDER — CLONIDINE HCL 0.1 MG PO TABS
0.2000 mg | ORAL_TABLET | Freq: Once | ORAL | Status: AC
  Administered 2013-07-20: 0.2 mg via ORAL
  Filled 2013-07-20: qty 2

## 2013-07-20 MED ORDER — THIAMINE HCL 100 MG/ML IJ SOLN
100.0000 mg | Freq: Once | INTRAMUSCULAR | Status: AC
  Administered 2013-07-20: 03:00:00 via INTRAMUSCULAR

## 2013-07-20 MED ORDER — CHLORDIAZEPOXIDE HCL 25 MG PO CAPS
25.0000 mg | ORAL_CAPSULE | Freq: Four times a day (QID) | ORAL | Status: AC
  Administered 2013-07-20 – 2013-07-21 (×5): 25 mg via ORAL
  Filled 2013-07-20 (×6): qty 1

## 2013-07-20 MED ORDER — CHLORDIAZEPOXIDE HCL 25 MG PO CAPS
25.0000 mg | ORAL_CAPSULE | Freq: Four times a day (QID) | ORAL | Status: AC | PRN
  Filled 2013-07-20: qty 1
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
## 2013-07-20 NOTE — Progress Notes (Signed)
Patient ID: Anthony Mcclure, male   DOB: 1951-11-10, 62 y.o.   MRN: [id number]  He has been in bed most of the day up for [place] periods, get  of time to see MD, get medication and meals. He denies having any withdrawal symptoms nor physical discomfort.  He has been calm cooperative and appropriate.

## 2013-07-20 NOTE — ED Notes (Signed)
Pt. Discharged to [facility] at this time.  Pt. Ambulatory without difficulty.  NAD noted upon discharge.

## 2013-07-20 NOTE — [facility] Suicide Risk Assessment (Signed)
[facility] INPATIENT: Family/Significant Other Suicide Prevention Education  Suicide Prevention Education:  Education Completed; No one has been identified by the patient as the family member/significant other with whom the patient will be residing, and identified as the person(s) who will aid the patient in the event of a mental health crisis (suicidal ideations/suicide attempt).   Pt did not c/o SI at admission, nor have they endorsed SI during their stay here. SPE not required. Pt given SPI pamphlet and encouraged to ask questions/talk about concerns relating to SPE.  [employer], LCSWA 07/20/2013 4:28 PM
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
## 2013-07-20 NOTE — Tx Team (Signed)
Initial Interdisciplinary Treatment Plan  PATIENT STRENGTHS: (choose at least two) Active sense of humor Average or above average intelligence Financial means General fund of knowledge Motivation for treatment/growth Religious Affiliation Supportive family/friends  PATIENT STRESSORS: Legal issue Medication change or noncompliance Substance abuse   PROBLEM LIST: Problem List/Patient Goals Date to be addressed Date deferred Reason deferred Estimated date of resolution  Substance Abuse: Etoh 07/20/13   D/C                                                   DISCHARGE CRITERIA:  Improved stabilization in mood, thinking, and/or behavior Medical problems require only outpatient monitoring Motivation to continue treatment in a less acute level of care Verbal commitment to aftercare and medication compliance Withdrawal symptoms are absent or subacute and managed without 24-hour nursing intervention  PRELIMINARY DISCHARGE PLAN: Attend 12-step recovery group Outpatient therapy Return to previous living arrangement  PATIENT/FAMIILY INVOLVEMENT: This treatment plan has been presented to and reviewed with the patient, Anthony Mcclure.  The patient and family have been given the opportunity to ask questions and make suggestions.  Lavanna Rog A 07/20/2013, 5:11 AM

## 2013-07-20 NOTE — Progress Notes (Signed)
Patient did attend the evening karaoke group. Pt was attentive and supportive.   

## 2013-07-20 NOTE — BHH Group Notes (Signed)
[facility] LCSW Group Therapy  07/20/2013 2:58 PM  Type of Therapy:  Group Therapy  Participation Level:  Did Not Attend  Smart, Herbert Seta 07/20/2013, 2:58 PM

## 2013-07-20 NOTE — ED Notes (Signed)
Pt.'s BP noted to be 161/104, Spoke with Dr. Rulon Abide and new orders received.  Pt. Medicated as ordered.

## 2013-07-20 NOTE — Progress Notes (Signed)
Recreation Therapy Notes  Date: 08.07.2014 Time: 3:00pm Location: [address] Dayroom  Group Topic: Communication, Team Building, Problem Solving  Goal Area(s) Addresses:  Patient will work effectively in teams to accomplish shared goal. Patient will identify skills used to make activity successful. Patient will relate skills used in group session to positive lifestyle changes.  Behavioral Response: Did not attend   Jearl Klinefelter, LRT/CTRS  Jearl Klinefelter 07/20/2013 9:10 PM

## 2013-07-20 NOTE — ED Notes (Signed)
Donell Sievert, [place] stated that it all right to transfer over with BP 161/104 P 85 after being medicated with clonidine 0.2 mg and cardizem CD 120 mg which DrMarland Kitchen Letta Kocher presrcibe. No new orders received form Donell Sievert, [place].Marland Kitchen

## 2013-07-20 NOTE — Consult Note (Signed)
[facility] Psychiatry Consult   Reason for Consult:  Eval for Alcohol Detox Referring Physician:  [facility] EDP  Anthony Mcclure is an 62 y.o. male.  Assessment: AXIS I:  Alcohol Abuse AXIS II:  No diagnosis AXIS III:   Past Medical History  Diagnosis Date  . CHF (congestive heart failure)   . Alcohol abuse   . Dysrhythmia     paroxismal atrial fib  . Seizure   . Paroxysmal Atrial Flutter 02/01/2012  . Noncompliance with medication treatment due to intermittent use of medication 02/02/2012  . HTN (hypertension) 02/02/2012  . Hepatitis C 02/02/2012  . Alcohol withdrawal seizure 02/02/2012  . Hypertension   . Hepatitis C    AXIS IV:  problems related to legal system/crime AXIS V:  21-30 behavior considerably influenced by delusions or hallucinations OR serious impairment in judgment, communication OR inability to function in almost all areas  Plan:  Recommend psychiatric Inpatient admission when medically cleared.  Subjective:   Anthony Mcclure is a 62 y.o. male presenting voluntarily seeking detox from chronic alcohol abuse. Patient endorses some passive SI without plan but denies SA/HI or AVH. Patient reportedly consumes 1/2 gallon of whisky daily and consumes beer when the [facility] store is closed. Patient endorses a hx of crack cocaine use, last used 3 years ago. Patient is not involved in AA or any previous IP alcohol detox programs. Patient has suffered several legal issues as a result to his long standing Alcohol abuse.  HPI:  HPI Elements:     Past Psychiatric History: Past Medical History  Diagnosis Date  . CHF (congestive heart failure)   . Alcohol abuse   . Dysrhythmia     paroxismal atrial fib  . Seizure   . Paroxysmal Atrial Flutter 02/01/2012  . Noncompliance with medication treatment due to intermittent use of medication 02/02/2012  . HTN (hypertension) 02/02/2012  . Hepatitis C 02/02/2012  . Alcohol withdrawal seizure 02/02/2012  . Hypertension   . Hepatitis C     reports that he  has never smoked. He has never used smokeless tobacco. He reports that  drinks alcohol. He reports that he uses illicit drugs (Marijuana and Cocaine). Family History  Problem Relation Age of Onset  . Heart disease Father   . Alcohol abuse Father   . Alcohol abuse Brother   . Diabetes type II Mother            Allergies:  No Known Allergies  Past Psychiatric History: Diagnosis:  Alcohol abuse  Hospitalizations:  no  Outpatient Care:  no  Substance Abuse Care:  yes  Self-Mutilation:  no  Suicidal Attempts:  no  Violent Behaviors:  yes   Objective: There were no vitals taken for this visit.There is no weight on file to calculate BMI. Results for orders placed during the hospital encounter of 07/19/13 (from the past 72 hour(s))  URINE RAPID DRUG SCREEN (HOSP PERFORMED)     Status: None   Collection Time    07/19/13 12:58 PM      Result Value Range   Opiates NONE DETECTED  NONE DETECTED   Cocaine NONE DETECTED  NONE DETECTED   Benzodiazepines NONE DETECTED  NONE DETECTED   Amphetamines NONE DETECTED  NONE DETECTED   Tetrahydrocannabinol NONE DETECTED  NONE DETECTED   Barbiturates NONE DETECTED  NONE DETECTED   Comment:            DRUG SCREEN FOR MEDICAL PURPOSES     ONLY.  IF CONFIRMATION IS  NEEDED     FOR ANY PURPOSE, NOTIFY LAB     WITHIN 5 DAYS.                LOWEST DETECTABLE LIMITS     FOR URINE DRUG SCREEN     Drug Class       Cutoff (ng/mL)     Amphetamine      1000     Barbiturate      200     Benzodiazepine   200     Tricyclics       300     Opiates          300     Cocaine          300     THC              50  ACETAMINOPHEN LEVEL     Status: None   Collection Time    07/19/13  1:00 PM      Result Value Range   Acetaminophen (Tylenol), Serum <15.0  10 - 30 ug/mL   Comment:            THERAPEUTIC CONCENTRATIONS VARY     SIGNIFICANTLY. A RANGE OF 10-30     ug/mL MAY BE AN EFFECTIVE     CONCENTRATION FOR MANY PATIENTS.     HOWEVER, SOME ARE BEST TREATED      AT CONCENTRATIONS OUTSIDE THIS     RANGE.     ACETAMINOPHEN CONCENTRATIONS     >150 ug/mL AT 4 HOURS AFTER     INGESTION AND >50 ug/mL AT 12     HOURS AFTER INGESTION ARE     OFTEN ASSOCIATED WITH TOXIC     REACTIONS.  CBC     Status: Abnormal   Collection Time    07/19/13  1:00 PM      Result Value Range   WBC 8.5  4.0 - 10.5 K/uL   RBC 5.31  4.22 - 5.81 MIL/uL   Hemoglobin 14.8  13.0 - 17.0 g/dL   HCT 81.1  91.4 - 78.2 %   MCV 82.3  78.0 - 100.0 fL   MCH 27.9  26.0 - 34.0 pg   MCHC 33.9  30.0 - 36.0 g/dL   RDW 95.6 (*) 21.3 - 08.6 %   Platelets 184  150 - 400 K/uL  COMPREHENSIVE METABOLIC PANEL     Status: Abnormal   Collection Time    07/19/13  1:00 PM      Result Value Range   Sodium 137  135 - 145 mEq/L   Potassium 4.1  3.5 - 5.1 mEq/L   Chloride 99  96 - 112 mEq/L   CO2 27  19 - 32 mEq/L   Glucose, Bld 83  70 - 99 mg/dL   BUN 14  6 - 23 mg/dL   Creatinine, Ser 5.78  0.50 - 1.35 mg/dL   Calcium 9.5  8.4 - 46.9 mg/dL   Total Protein 8.2  6.0 - 8.3 g/dL   Albumin 3.9  3.5 - 5.2 g/dL   AST 629 (*) 0 - 37 U/L   ALT 171 (*) 0 - 53 U/L   Alkaline Phosphatase 69  39 - 117 U/L   Total Bilirubin 0.9  0.3 - 1.2 mg/dL   GFR calc non Af Amer >90  >90 mL/min   GFR calc Af Amer >90  >90 mL/min   Comment:  The eGFR has been calculated     using the CKD EPI equation.     This calculation has not been     validated in all clinical     situations.     eGFR's persistently     <90 mL/min signify     possible Chronic Kidney Disease.  ETHANOL     Status: Abnormal   Collection Time    07/19/13  1:00 PM      Result Value Range   Alcohol, Ethyl (B) 41 (*) 0 - 11 mg/dL   Comment:            LOWEST DETECTABLE LIMIT FOR     SERUM ALCOHOL IS 11 mg/dL     FOR MEDICAL PURPOSES ONLY  SALICYLATE LEVEL     Status: Abnormal   Collection Time    07/19/13  1:00 PM      Result Value Range   Salicylate Lvl <2.0 (*) 2.8 - 20.0 mg/dL   Labs are reviewed and are pertinent  for ( BAL 41, no critical lab values noted)  No current facility-administered medications for this encounter.    Psychiatric Specialty Exam:     There were no vitals taken for this visit.There is no weight on file to calculate BMI.  General Appearance: Disheveled  Eye Contact::  Good  Speech:  Garbled  Volume:  Decreased  Mood:  Euthymic  Affect:  Congruent  Thought Process:  Circumstantial  Orientation:  Full (Time, Place, and Person)  Thought Content:  NA  Suicidal Thoughts:  Yes.  without intent/plan  Homicidal Thoughts:  No  Memory:  Immediate;   Good  Judgement:  Impaired  Insight:  Shallow  Psychomotor Activity:  NA  Concentration:  Fair  Recall:  Fair  Akathisia:  Negative  Handed:  Right  AIMS (if indicated):     Assets:  Others:  Pending legal matters  Sleep:      Treatment Plan Summary: 1) Patient is admitted to [facility] [address], for crises mgmt, safety and stabilization of Alcohol abuse 2) Social work to aid in OP support services i.Mcclure. AA 3) Mgmt of co-morbid conditions 4) Administration of psychotropics and psychotherapeutic interventions as needed  Amariya Liskey Mcclure 07/20/2013 1:52 AM

## 2013-07-20 NOTE — Progress Notes (Signed)
D: Pt is currently denying any withdrawal symptoms at this time. Pt is also denying any SI/HI/AVH. Pt encouraged to attend Karaoke this evening. Pt has no concerns he wishes for this writer to address at this time. A: Writer administered scheduled medications to pt. Continued support and availability as needed was extended to this pt. Staff continue to monitor pt with q36min checks.  R: Pt attended Karaoke this evening. No adverse drug reactions noted. Pt receptive to treatment. Pt remains safe at this time.

## 2013-07-20 NOTE — ED Notes (Signed)
Spoke with Tora Kindred about patient BP 161/104 P 85 reports that patient can still be transported over if Donell Sievert, [place] agrees.
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
## 2013-07-21 NOTE — ED Provider Notes (Signed)
History/physical exam/procedure(s) were performed by non-physician practitioner and as supervising physician I was immediately available for consultation/collaboration. I have reviewed all notes and am in agreement with care and plan.   Ardel Jagger S Linley Moskal, MD 07/21/13 2159 

## 2013-07-21 NOTE — [facility] Group Notes (Signed)
[facility] LCSW Group Therapy  07/21/2013 4:10 PM  Type of Therapy:  Group Therapy  Participation Level:  Minimal  Participation Quality:  Inattentive  Affect:  Excited  Cognitive:  Lacking  Insight:  None  Engagement in Therapy:  Limited  Modes of Intervention:  Discussion, Education, Exploration, Socialization and Support  Summary of Progress/Problems: Feelings around Relapse. Group members discussed the meaning of relapse and shared personal stories of relapse, how it affected them and others, and how they perceived themselves during this time. Group members were encouraged to identify triggers, warning signs and coping skills used when facing the possibility of relapse. Social supports were discussed and explored in detail. Post Acute Withdrawal Syndrome (handout provided) was introduced and examined. Pt's were encouraged to ask questions, talk about key points associated with PAWS, and process this information in terms of relapse prevention. Anthony Mcclure came to group about 30 minutes late. He stated that "I don't have a problem with drinking. I love to drink. I love to party. I'm only here so I don't go to jail. I love my life." Anthony Mcclure shows no progress in the group setting and no insight AEB his inability to connect how his failing health, legal troubles, and life stressors are directly related to his alcohol abuse. Anthony Mcclure was inattentive and resistant to participating in discussion about relapse prevention and feelings around relapse.    Smart, [name] 07/21/2013, 4:10 PM

## 2013-07-21 NOTE — Progress Notes (Signed)
D: Patient denies SI/HI and A/V hallucinations; patient reports sleep is well; reports appetite is good; reports energy level is normal ; reports ability to pay attention is good; rates depression as 0/10; rates hopelessness 0/10; rates anxiety as 0/10; patient reports that "Im here because of court and so I want go to jail"  A: Monitored q 15 minutes; patient encouraged to attend groups; patient educated about medications; patient given medications per physician orders; patient encouraged to express feelings and/or concerns  R: Patient is cooperative with medications and meals; patient is not attending groups and asking for " a bottle of liquor" ; patient's interaction with staff and peers is appropriate;  patient is taking medications as prescribed and tolerating medications; patient is not attending  groups

## 2013-07-21 NOTE — Progress Notes (Signed)
Pt. Attended AA 

## 2013-07-21 NOTE — [facility] Group Notes (Signed)
[facility] LCSW Aftercare Discharge Planning Group Note   07/21/2013 9:36 AM  Participation Quality:  DID NOT ATTEND    Smart, Herbert Seta

## 2013-07-21 NOTE — Progress Notes (Signed)
D.  Pt bright on approach, in bed.  Denies complaints at this time.  Positive for AA group tonight.  Interacting appropriately with peers on the unit.  Denies SI/HI/hallucinations at this time.  A.  Support and encouragement offered  R.  Pt remains safe on the unit, no distress noted.  Will continue to monitor.

## 2013-07-21 NOTE — Tx Team (Signed)
Interdisciplinary Treatment Plan Update (Adult)  Date: 07/21/2013   Time Reviewed: 10:59 AM  Progress in Treatment:  Attending groups: No Participating in groups:  No Taking medication as prescribed: Yes  Tolerating medication: Yes  Family/Significant othe contact made: No. SPE not required for this pt.   Patient understands diagnosis: No. Pt stated that he has no intention of quitting drinking and stated that he is at [facility] to appease the court system. Pt is not participating in groups or d/c planning.  Discussing patient identified problems/goals with staff: Yes  Medical problems stabilized or resolved: Yes  Denies suicidal/homicidal ideation: Yes self report/admission.  Patient has not harmed self or Others: Yes  New problem(s) identified: n/a  Discharge Plan or Barriers: Pt stated that he has no intention to stop drinking but reports that in order to avoid jail time, he must complete detox and treatment in longer term facility. Pt interested in [facility] residential and will follow up at [facility] for med management.  Additional comments: N/A  Reason for Continuation of Hospitalization: Librium taper-withdrawals Medication management Estimated length of stay: 2-3 days. Likely d/c Monday.  For review of initial/current patient goals, please see plan of care.  Attendees:  Patient:    Family:    Physician: Geoffery Lyons MD 07/21/2013 10:58 AM   Nursing: Thayer Ohm RN 07/21/2013 10:59 AM   Clinical Social Worker Daune Divirgilio Smart, LCSWA  07/21/2013 10:59 AM   Other: Philippa Chester RN 07/21/2013 10:59 AM   Other: Aggie PA 07/21/2013 10:59 AM   Other: Massie Kluver, Community Care Coordinator  07/21/2013 10:59 AM   Other: Darden Dates Nurse CM 07/21/2013 10:59 AM   Scribe for Treatment Team:  Herbert Seta Smart LCSWA 07/21/2013 10:59 AM

## 2013-07-22 DIAGNOSIS — F39 Unspecified mood [affective] disorder: Secondary | ICD-10-CM

## 2013-07-22 MED ORDER — GABAPENTIN 300 MG PO CAPS
300.0000 mg | ORAL_CAPSULE | Freq: Three times a day (TID) | ORAL | Status: DC
  Administered 2013-07-22 – 2013-07-24 (×6): 300 mg via ORAL
  Filled 2013-07-22 (×13): qty 1

## 2013-07-22 MED ORDER — OMEGA-3-ACID ETHYL ESTERS 1 G PO CAPS
1.0000 g | ORAL_CAPSULE | Freq: Two times a day (BID) | ORAL | Status: DC
  Administered 2013-07-22 – 2013-07-24 (×4): 1 g via ORAL
  Filled 2013-07-22 (×10): qty 1

## 2013-07-22 NOTE — Progress Notes (Signed)
Psychoeducational Group Note  Date:  07/22/2013 Time:  0945 am  Group Topic/Focus:  Identifying Needs:   The focus of this group is to help patients identify their personal needs that have been historically problematic and identify healthy behaviors to address their needs.  Participation Level:  Active  Participation Quality:  Appropriate and Attentive  Affect:  Appropriate  Cognitive:  Alert and Appropriate  Insight:  Improving  Engagement in Group:  Improving  Additional Comments:    Andrena Mews 07/22/2013,3:18 PM

## 2013-07-22 NOTE — [facility] Group Notes (Signed)
[facility] Group Notes: (Clinical Social Work)   07/22/2013      Type of Therapy:  Group Therapy   Participation Level:  Did Not Attend    Anthony Balderrama Grossman-Orr, LCSW 07/22/2013, 11:19 AM     

## 2013-07-22 NOTE — Progress Notes (Signed)
D.  Pt pleasant on approach, denies any and all withdrawal symptoms.  States he will not take another drink, states his room mate is suffering too much with withdrawal.  Pt did not go to evening group, remained in bed.  Denies complaints at this time.  Denies SI/HI/hallucinations of any kind.  A.  Support and encouragement offered  R. Will continue to monitor.

## 2013-07-22 NOTE — [facility] Group Notes (Signed)
[facility] Group Notes:  (Nursing/MHT/Case Management/Adjunct)  Date:  07/22/2013  Time:  1:25 PM  Type of Therapy:  Psychoeducational Skills  Participation Level:  Did Not Attend  Anthony Mcclure 07/22/2013, 1:25 PM

## 2013-07-22 NOTE — Progress Notes (Signed)
[facility] MD Progress Note  07/22/2013 4:59 PM JEMIAH ELLENBURG  MRN:  [id number] Subjective:  Aidin endorses that he is willing to go to rehab afterwards. 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 They all get together. He has something to drink too. He is willing to work on abstaining. He is going to go back to the hotel after the 30 days at [facility]. 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  Diagnosis:  Alcohol Dependence, Mood Disorder NOS  ADL's:  Intact  Sleep: Fair  Appetite:  Fair  Suicidal Ideation:  Plan:  Denies Intent:  denies Means:  denies Homicidal Ideation:  Plan:  denies Intent:  denies Means:  denies AEB (as evidenced by):  Psychiatric Specialty Exam: Review of Systems  Constitutional: Negative.   HENT: Negative.   Eyes: Negative.   Respiratory: Negative.   Cardiovascular: Negative.   Gastrointestinal: Negative.   Genitourinary: Negative.   Musculoskeletal: Negative.   Skin: Negative.   Neurological:       Burning in his legs, feet, numbness  Endo/Heme/Allergies: Negative.   Psychiatric/Behavioral: Positive for substance abuse. The patient is nervous/anxious.     Blood pressure 127/90, pulse 96, temperature 97.5 F (36.4 C), temperature source Oral, resp. rate 18, height 6' (1.829 m), weight 86.183 kg (190 lb).Body mass index is 25.76 kg/(m^2).  General Appearance: Disheveled  Eye Solicitor::  Fair  Speech:  hard to understand  Volume:  fluctuates  Mood:  in pain (leg-feet) worried   Affect:  Inappropriate  Thought Process:  Coherent and Goal Directed  Orientation:  Full (Time, Place, and Person)  Thought Content:  worreis, concerns, has family history of diabetes concernd about his symptoms  Suicidal Thoughts:  No  Homicidal Thoughts:  No  Memory:  Immediate;   Fair Recent;   Fair Remote;   Fair  Judgement:  Fair  Insight:  Shallow  Psychomotor Activity:  Restlessness  Concentration:  Fair  Recall:  Fair  Akathisia:  No  Handed:   Right  AIMS (if indicated):     Assets:  Desire for Improvement  Sleep:  Number of Hours: 5.5   Current Medications: Current Facility-Administered Medications  Medication Dose Route Frequency Provider Last Rate Last Dose  . acetaminophen (TYLENOL) tablet 650 mg  650 mg Oral Q6H PRN Kerry Hough, PA-C   650 mg at 07/20/13 0249  . alum & mag hydroxide-simeth (MAALOX/MYLANTA) 200-200-20 MG/5ML suspension 30 mL  30 mL Oral Q4H PRN Kerry Hough, PA-C      . chlordiazePOXIDE (LIBRIUM) capsule 25 mg  25 mg Oral Q6H PRN Kerry Hough, PA-C      . chlordiazePOXIDE (LIBRIUM) capsule 25 mg  25 mg Oral BH-qamhs Spencer E Simon, PA-C       Followed by  . [START ON 07/24/2013] chlordiazePOXIDE (LIBRIUM) capsule 25 mg  25 mg Oral Daily Kerry Hough, PA-C      . gabapentin (NEURONTIN) capsule 300 mg  300 mg Oral TID Rachael Fee, MD      . hydrOXYzine (ATARAX/VISTARIL) tablet 25 mg  25 mg Oral Q6H PRN Kerry Hough, PA-C      . loperamide (IMODIUM) capsule 2-4 mg  2-4 mg Oral PRN Kerry Hough, PA-C      . magnesium hydroxide (MILK OF MAGNESIA) suspension 30 mL  30 mL Oral Daily PRN Kerry Hough, PA-C      . multivitamin with minerals tablet 1 tablet  1  tablet Oral Daily Kerry Hough, PA-C   1 tablet at 07/22/13 8295  . omega-3 acid ethyl esters (LOVAZA) capsule 1 g  1 g Oral BID Rachael Fee, MD      . ondansetron (ZOFRAN-ODT) disintegrating tablet 4 mg  4 mg Oral Q6H PRN Kerry Hough, PA-C      . thiamine (VITAMIN B-1) tablet 100 mg  100 mg Oral Daily Kerry Hough, PA-C   100 mg at 07/22/13 0819  . traZODone (DESYREL) tablet 50 mg  50 mg Oral QHS,MR X 1 Kerry Hough, PA-C   50 mg at 07/21/13 2213    Lab Results: No results found for this or any previous visit (from the past 48 hour(s)).  Physical Findings: AIMS: Facial and Oral Movements Muscles of Facial Expression: None, normal Lips and Perioral Area: None, normal Jaw: None, normal Tongue: None, normal,Extremity  Movements Upper (arms, wrists, hands, fingers): None, normal Lower (legs, knees, ankles, toes): None, normal, Trunk Movements Neck, shoulders, hips: None, normal, Overall Severity Severity of abnormal movements (highest score from questions above): None, normal Incapacitation due to abnormal movements: None, normal Patient's awareness of abnormal movements (rate only patient's report): No Awareness, Dental Status Current problems with teeth and/or dentures?: Yes Does patient usually wear dentures?: No  CIWA:  CIWA-Ar Total: 3 COWS:     Treatment Plan Summary: Daily contact with patient to assess and evaluate symptoms and progress in treatment Medication management  Plan: Supportive approach/coping skills/relapse prevention           Pursue the Detox            R/O neuropathy: Neurontin 300 mg TID  Medical Decision Making Problem Points:  Review of psycho-social stressors (1) Data Points:  Review of new medications or change in dosage (2)  I certify that inpatient services furnished can reasonably be expected to improve the patient's condition.   Juell Radney A 07/22/2013, 4:59 PM

## 2013-07-22 NOTE — Progress Notes (Signed)
D   Pt verbalized not being happy because he is in the hospital   He said it was ordered by a judge and he does not think there is anything wrong with him  He reports he plans to continue to drink after he leaves the hospital and the only reason he is here is so that he wouldn't have to go to jail  He has requested books on gardening or some other magazines to help him pass the time A   Verbal support given   Medications administered and effectiveness monitored   Tried to find books for pt but none were available   Will encourage another diversional activity   Q 15 min checks R   Pt safe at present

## 2013-07-23 NOTE — Progress Notes (Signed)
Patient did not attend the evening speaker AA meeting. Pt remained in bed during group.   

## 2013-07-23 NOTE — Progress Notes (Signed)
Adult Psychoeducational Group Note  Date:  07/23/2013 Time:  3:15PM  Group Topic/Focus:  Making Healthy Choices:   The focus of this group is to help patients identify negative/unhealthy choices they were using prior to admission and identify positive/healthier coping strategies to replace them upon discharge.  Participation Level:  Minimal  Participation Quality:  Redirectable  Affect:  Excited  Cognitive:  Disorganized  Insight: Limited  Engagement in Group:  Engaged  Modes of Intervention:  Discussion  Additional Comments:  Pt was inappropriate during the group session and required redirection for his off topic conversation. Pt did not want to discuss his support systems and instead strayed off topic.  Zacarias Pontes R 07/23/2013, 4:27 PM

## 2013-07-23 NOTE — Progress Notes (Signed)
D. Pt pleasant and bright on approach, did not attend evening AA group.  Hopeful for discharge soon.  No complaints voiced.  Denies SI/HI/hallucinations at this time.  A.  Support and encouragement offered  R.  Pt remains safe on unit, will continue to monitor.

## 2013-07-23 NOTE — [facility] Group Notes (Signed)
[facility] Group Notes:  (Clinical Social Work)  07/23/2013  10:00-11:00AM  Summary of Progress/Problems:   The main focus of today's process group was to   identify the patient's current support system and decide on other supports that can be put in place.  Four definitions/levels of support were discussed and an exercise was utilized to show how much stronger we become with additional supports.  An emphasis was placed on using counselor, doctor, therapy groups, 12-step groups, and problem-specific support groups to expand supports, as well as doing something different than has been done before. The patient expressed at both the beginning and end of group that he does not needs supports. He stated he is here only due to a court order, and will go from here for a rehab program under court order only to avoid 3 years in prison.  Then he will get out and resume his drinking with his friends.,  Type of Therapy:  Process Group with Motivational Interviewing  Participation Level:  Minimal  Participation Quality:  Drowsy and Inattentive  Affect:  Blunted  Cognitive:  Oriented  Insight:  None  Engagement in Therapy:  Lacking  Modes of Intervention:   Education, Support and Processing, Activity  Anthony Mantle, LCSW 07/23/2013, 12:36 PM

## 2013-07-23 NOTE — [facility] Group Notes (Signed)
[facility] Group Notes:  (Nursing/MHT/Case Management/Adjunct)  Date:  07/23/2013  Time:  3:19 PM  Type of Therapy:  Nurse Education  Participation Level:  None  Participation Quality:  Drowsy  Affect:     Cognitive:    Insight:    Engagement in Group:    Modes of Intervention:    Summary of Progress/Problems:slept entire group  Erich Kochan G 07/23/2013, 3:19 PM

## 2013-07-23 NOTE — Progress Notes (Signed)
[facility] MD Progress Note  07/23/2013 2:54 PM Anthony Mcclure  MRN:  [id number] Subjective:  Anthony Mcclure states that he is going to try to abstain. He is willing to give it a try. He states that he does not want to end up like some of the people he has seen here with all the physical complications of the alcoholism. States he thinks he can be around it and not drink. He is looking at going to rehab afterwards Diagnosis:  Alcohol Dependence  ADL's:  Intact  Sleep: Fair  Appetite:  Fair  Suicidal Ideation:  Plan:  denies Intent:  denies Means:  denies Homicidal Ideation:  Plan:  denies Intent:  denies Means:  denies AEB (as evidenced by):  Psychiatric Specialty Exam: Review of Systems  Constitutional: Negative.   HENT: Negative.   Eyes: Negative.   Respiratory: Negative.   Cardiovascular: Negative.   Gastrointestinal: Negative.   Genitourinary: Negative.   Musculoskeletal: Negative.   Skin: Negative.   Neurological: Negative.   Endo/Heme/Allergies: Negative.   Psychiatric/Behavioral: Positive for substance abuse. The patient is nervous/anxious.     Blood pressure 149/91, pulse 73, temperature 97.5 F (36.4 C), temperature source Oral, resp. rate 16, height 6' (1.829 m), weight 86.183 kg (190 lb).Body mass index is 25.76 kg/(m^2).  General Appearance: Disheveled  Eye Solicitor::  Fair  Speech:  Slow and enunciation problems  Volume:  fluctuates  Mood:  Anxious and worried (more aware of the possible consequences of his alcohol abuse)  Affect:  smiles at time inappropriately  Thought Process:  Coherent and Goal Directed  Orientation:  Full (Time, Place, and Person)  Thought Content:  worries, concerns  Suicidal Thoughts:  No  Homicidal Thoughts:  No  Memory:  Immediate;   Fair Recent;   Fair Remote;   Fair  Judgement:  Fair  Insight:  Shallow  Psychomotor Activity:  Restlessness  Concentration:  Fair  Recall:  Fair  Akathisia:  No  Handed:  Right  AIMS (if indicated):      Assets:  Desire for Improvement  Sleep:  Number of Hours: 4.75   Current Medications: Current Facility-Administered Medications  Medication Dose Route Frequency Provider Last Rate Last Dose  . acetaminophen (TYLENOL) tablet 650 mg  650 mg Oral Q6H PRN Kerry Hough, PA-C   650 mg at 07/20/13 0249  . alum & mag hydroxide-simeth (MAALOX/MYLANTA) 200-200-20 MG/5ML suspension 30 mL  30 mL Oral Q4H PRN Kerry Hough, PA-C      . [START ON 07/24/2013] chlordiazePOXIDE (LIBRIUM) capsule 25 mg  25 mg Oral Daily Kerry Hough, PA-C      . gabapentin (NEURONTIN) capsule 300 mg  300 mg Oral TID Rachael Fee, MD   300 mg at 07/23/13 1159  . magnesium hydroxide (MILK OF MAGNESIA) suspension 30 mL  30 mL Oral Daily PRN Kerry Hough, PA-C      . multivitamin with minerals tablet 1 tablet  1 tablet Oral Daily Kerry Hough, PA-C   1 tablet at 07/23/13 0857  . omega-3 acid ethyl esters (LOVAZA) capsule 1 g  1 g Oral BID Rachael Fee, MD   1 g at 07/23/13 0857  . thiamine (VITAMIN B-1) tablet 100 mg  100 mg Oral Daily Kerry Hough, PA-C   100 mg at 07/23/13 0857  . traZODone (DESYREL) tablet 50 mg  50 mg Oral QHS,MR X 1 Kerry Hough, PA-C   50 mg at 07/22/13 2128    Lab  Results: No results found for this or any previous visit (from the past 48 hour(s)).  Physical Findings: AIMS: Facial and Oral Movements Muscles of Facial Expression: None, normal Lips and Perioral Area: None, normal Jaw: None, normal Tongue: None, normal,Extremity Movements Upper (arms, wrists, hands, fingers): None, normal Lower (legs, knees, ankles, toes): None, normal, Trunk Movements Neck, shoulders, hips: None, normal, Overall Severity Severity of abnormal movements (highest score from questions above): None, normal Incapacitation due to abnormal movements: None, normal Patient's awareness of abnormal movements (rate only patient's report): No Awareness, Dental Status Current problems with teeth and/or  dentures?: Yes Does patient usually wear dentures?: No  CIWA:  CIWA-Ar Total: 0 COWS:     Treatment Plan Summary: Daily contact with patient to assess and evaluate symptoms and progress in treatment Medication management  Plan: Supportive approach/coping skills/relapse prevention           Continue to identify any possible co morbidities  Medical Decision Making Problem Points:  Review of psycho-social stressors (1) Data Points:  Review of medication regiment & side effects (2)  I certify that inpatient services furnished can reasonably be expected to improve the patient's condition.   Anthony Mcclure A 07/23/2013, 2:54 PM

## 2013-07-23 NOTE — [facility] Group Notes (Signed)
[facility] Group Notes:  (Nursing/MHT/Case Management/Adjunct)  Date:  07/23/2013  Time:  10:39 AM  Type of Therapy: Psychoeducational Skills   Participation Level: Active   Participation Quality: Appropriate   Affect: Appropriate   Cognitive: Appropriate   Insight: Appropriate   Engagement in Group: Engaged   Modes of Intervention: Problem-solving   Summary of Progress/Problems: Pt attended [employer] group, and engaged in treatment.   Jacquelyne Balint Shanta 07/23/2013, 10:39 AM

## 2013-07-23 NOTE — Progress Notes (Signed)
Psychoeducational Group Note  Date:  07/22/2013 Time:  2100 Group Topic/Focus:  wrap up group  Participation Level: Did Not Attend  Participation Quality:  Not Applicable  Affect:  Not Applicable  Cognitive:  Not Applicable  Insight:  Not Applicable  Engagement in Group: Not Applicable  Additional Comments:  Pt remained in bed during group.  Shelah Lewandowsky 07/23/2013, 12:34 AM

## 2013-07-23 NOTE — Progress Notes (Signed)
D   Pt verbalized not being happy because he is in the hospital   He said it was ordered by a judge and he does not think there is anything wrong with him  He reports he plans to continue to drink after he leaves the hospital and the only reason he is here is so that he wouldn't have to go to jail  He has requested books on gardening or some other magazines to help him pass the time  Pt is pleasant on approach but continues to say he will party and use after he is discharged but will go along with the program while here A   Verbal support given   Medications administered and effectiveness monitored   Tried to find books for pt but none were available   Will encourage another diversional activity   Q 15 min checks R   Pt safe at present

## 2013-07-24 MED ORDER — ASPIRIN 81 MG PO TBEC: 81.0000 mg | DELAYED_RELEASE_TABLET | Freq: Every day | ORAL | Status: DC

## 2013-07-24 MED ORDER — FOLIC ACID 1 MG PO TABS: 1.0000 mg | ORAL_TABLET | Freq: Every day | ORAL | Status: DC

## 2013-07-24 MED ORDER — DILTIAZEM HCL ER 120 MG PO CP24: 120.0000 mg | ORAL_CAPSULE | Freq: Every day | ORAL | Status: DC

## 2013-07-24 MED ORDER — GABAPENTIN 300 MG PO CAPS: 300.0000 mg | ORAL_CAPSULE | Freq: Three times a day (TID) | ORAL | Status: DC

## 2013-07-24 MED ORDER — OMEGA-3-ACID ETHYL ESTERS 1 G PO CAPS: 1.0000 g | ORAL_CAPSULE | Freq: Two times a day (BID) | ORAL | Status: DC

## 2013-07-24 MED ORDER — TRAZODONE HCL 50 MG PO TABS: 50.0000 mg | ORAL_TABLET | Freq: Every evening | ORAL | Status: DC | PRN

## 2013-07-24 MED ORDER — ADULT MULTIVITAMIN W/MINERALS CH: 1.0000 | ORAL_TABLET | Freq: Every day | ORAL | Status: DC

## 2013-07-24 MED ORDER — PHENYTOIN SODIUM EXTENDED 100 MG PO CAPS: 100.0000 mg | ORAL_CAPSULE | Freq: Every day | ORAL | Status: DC

## 2013-07-24 NOTE — Progress Notes (Signed)
Adult Psychoeducational Group Note  Date:  07/24/2013 Time:  11:00AM Group Topic/Focus:  Self Care:   The focus of this group is to help patients understand the importance of self-care in order to improve or restore emotional, physical, spiritual, interpersonal, and financial health.  Participation Level:  Did Not Attend   Additional Comments:  Pt. Didn't attend group.   Bing Plume D 07/24/2013, 1:43 PM

## 2013-07-24 NOTE — Progress Notes (Signed)
[facility] Adult Case Management Discharge Plan :  Will you be returning to the same living situation after discharge: Yes,  home until [facility] admission on 8/25 At discharge, do you have transportation home?:Yes,  bus/friend Do you have the ability to pay for your medications:Yes,  medicare  Release of information consent forms completed and in the chart;  Patient's signature needed at discharge.  Patient to Follow up at: Follow-up Information   Follow up with [facility] On 08/07/2013. (Arrive by 8AM for admission with ID, 30 day medication supply (if you are taking medications), and clothing. )    Contact information:   [address]  [place], [place] [address] Phone: [phone] Fax: [phone]      Follow up with [facility]. (Walk in Monday through Friday between 8am-9am for hospital followup/medication management. )    Contact information:   [address] [place], [place] [address] phone: [phone] fax: [phone]      Patient denies SI/HI:   Yes,  during admission and during stay at [facility]    Safety Planning and Suicide Prevention discussed:  Yes,  spe completed with pt and pt given SPI pamphlet and encouraged to ask questions/talk about concerns.  Smart, Donetta Isaza 07/24/2013, 11:08 AM

## 2013-07-24 NOTE — [facility] Group Notes (Signed)
[facility] LCSW Aftercare Discharge Planning Group Note   07/24/2013 10:33 AM  Participation Quality:  Appropriate   Mood/Affect:  Excited  Depression Rating:  0  Anxiety Rating:  0  Thoughts of Suicide:  No Will you contract for safety?   NA  Current AVH:  No  Plan for Discharge/Comments:  Pt informed that he has [facility] admission date of 8/25. He stated that he plans to drink and "party all week" until a few days prior to this date and reported a lack of motivation to abstain from drinking. Pt demonstrates a severe lack of insight and motivation for change. Pt aware that failing to get into [facility] due to intoxication could lead to jail time for him. Pt is not concerned about this and is hoping to d/c immediately. CSW spoke with Ander Slade (pt's case manager) on Friday in order to inform her of pt's attitude toward treatment and lack of motivation for recovery.   Transportation Means: bus pass  Supports: Sports coach and some family.   Smart, [employer]

## 2013-07-24 NOTE — Progress Notes (Signed)
Pt d/c from hospital. All items returned. Pt has cash for bus ticket. Pt denies si and hi. D/C instructions given, and prescriptions given. Pt reports that he intends to drink alcohol following discharge. Reinforced to pt consequences to drinking and the effects of alcohol on health following detox.

## 2013-07-24 NOTE — Plan of Care (Signed)
Problem: Alteration in mood & ability to function due to Goal: LTG-Pt reports reduction in suicidal thoughts (Patient reports reduction in suicidal thoughts and is able to verbalize a safety plan for whenever patient is feeling suicidal)  Outcome: Completed/Met Date Met:  07/24/13 Pt denies si and hi Goal: LTG-Patient demonstrates decreased signs of withdrawal Pt reporting mild withdrawals symptoms. No CIWA score and stable vitals. By d/c, pt will report no signs of withdrawal, with CIWA score of 0, and stable vitals. Goal not met. Heather Smart, LCSWA 07/21/2013 11:03 AM   Pt reporting no signs of withdrawal. No CIWA score taken today. Pt has stable vitals. Temp low 96.7 degrees F. Per MD, pt stable for d/c today. Goal met. [employer], LCSWA 07/24/2013 11:07 AM  Outcome: Completed/Met Date Met:  07/24/13 Pt denies withdrawal symptoms Goal: LTG-Pt is able to verbalize triggers for his/her abuse (Patient is able to verbalize triggers for his/her abuse and strategies to maintain sobriety)  Outcome: Not Progressing Pt reports that he does not intend to stop drinking after discharge. Goal: STG: Patient verbalizes decreases in signs of withdrawal Outcome: Completed/Met Date Met:  07/24/13 Pt denies withdrawal symptoms Goal: STG-Patient will report withdrawal symptoms Outcome: Completed/Met Date Met:  07/24/13 Pt denies withdrawal symptoms Goal: STG-Patient will comply with prescribed medication regimen (Patient will comply with prescribed medication regimen)  Outcome: Completed/Met Date Met:  07/24/13 Pt taking medications in the hospital

## 2013-07-24 NOTE — [facility] Suicide Risk Assessment (Signed)
Suicide Risk Assessment  Discharge Assessment     Demographic Factors:  Living alone  Mental Status Per Nursing Assessment::   On Admission:  NA  Current Mental Status by Physician: In full contact with reality. There are no suicidal ideas, plans or intent. His mood is euthymic, his affect is appropriate. He states he is going to maintain sobriety. He has an appointment to be admitted to [facility] next week. States he is going to stay sober. He understands the possible consequences physical and legal if he was to relapse   Loss Factors: Decline in physical health and Legal issues  Historical Factors: NA  Risk Reduction Factors:   wanting to live  Continued Clinical Symptoms:  Alcohol/Substance Abuse/Dependencies  Cognitive Features That Contribute To Risk:  Closed-mindedness Polarized thinking Thought constriction (tunnel vision)    Suicide Risk:  Minimal: No identifiable suicidal ideation.  Patients presenting with no risk factors but with morbid ruminations; may be classified as minimal risk based on the severity of the depressive symptoms  Discharge Diagnoses:   AXIS I:  Alcohol Dependence AXIS II:  Deferred AXIS III:   Past Medical History  Diagnosis Date  . CHF (congestive heart failure)   . Alcohol abuse   . Seizure   . Noncompliance with medication treatment due to intermittent use of medication 02/02/2012  . HTN (hypertension) 02/02/2012  . Alcohol withdrawal seizure 02/02/2012  . Hypertension   . Dysrhythmia     paroxismal atrial fib  . Paroxysmal Atrial Flutter 02/01/2012  . Hepatitis C 02/02/2012    Unsure if he was ever diagnosed with Hep C  . Hepatitis C    AXIS IV:  other psychosocial or environmental problems,legal AXIS V:  61-70 mild symptoms  Plan Of Care/Follow-up recommendations:  Activity:  as tolerated Diet:  regular Follow up outpatient basis and [facility] residential Is patient on multiple antipsychotic therapies at discharge:  No   Has  Patient had three or more failed trials of antipsychotic monotherapy by history:  No  Recommended Plan for Multiple Antipsychotic Therapies: N/A   Gibran Veselka A 07/24/2013, 12:10 PM

## 2013-07-25 ENCOUNTER — Emergency Department (HOSPITAL_COMMUNITY)
Admission: EM | Admit: 2013-07-25 | Discharge: 2013-07-25 | Disposition: A | Discharge status: Home / Self Care | Payer: Medicare Other | Attending: Emergency Medicine | Admitting: Emergency Medicine

## 2013-07-25 ENCOUNTER — Encounter (HOSPITAL_COMMUNITY): Payer: Self-pay | Admitting: Emergency Medicine

## 2013-07-25 DIAGNOSIS — Z8679 Personal history of other diseases of the circulatory system: Secondary | ICD-10-CM | POA: Insufficient documentation

## 2013-07-25 DIAGNOSIS — F10929 Alcohol use, unspecified with intoxication, unspecified: Secondary | ICD-10-CM

## 2013-07-25 DIAGNOSIS — I4892 Unspecified atrial flutter: Secondary | ICD-10-CM | POA: Insufficient documentation

## 2013-07-25 DIAGNOSIS — Z8659 Personal history of other mental and behavioral disorders: Secondary | ICD-10-CM | POA: Insufficient documentation

## 2013-07-25 DIAGNOSIS — F10229 Alcohol dependence with intoxication, unspecified: Secondary | ICD-10-CM | POA: Insufficient documentation

## 2013-07-25 DIAGNOSIS — I509 Heart failure, unspecified: Secondary | ICD-10-CM | POA: Insufficient documentation

## 2013-07-25 DIAGNOSIS — F1010 Alcohol abuse, uncomplicated: Secondary | ICD-10-CM

## 2013-07-25 DIAGNOSIS — Z7982 Long term (current) use of aspirin: Secondary | ICD-10-CM | POA: Insufficient documentation

## 2013-07-25 DIAGNOSIS — I10 Essential (primary) hypertension: Secondary | ICD-10-CM | POA: Insufficient documentation

## 2013-07-25 DIAGNOSIS — Z8619 Personal history of other infectious and parasitic diseases: Secondary | ICD-10-CM | POA: Insufficient documentation

## 2013-07-25 DIAGNOSIS — Z79899 Other long term (current) drug therapy: Secondary | ICD-10-CM | POA: Insufficient documentation

## 2013-07-25 DIAGNOSIS — IMO0002 Reserved for concepts with insufficient information to code with codable children: Secondary | ICD-10-CM | POA: Insufficient documentation

## 2013-07-25 LAB — CBC WITH DIFFERENTIAL/PLATELET
Basophils Absolute: 0.1 10*3/uL (ref 0.0–0.1)
Basophils Relative: 1 % (ref 0–1)
Eosinophils Absolute: 0.2 10*3/uL (ref 0.0–0.7)
HCT: 37.4 % — ABNORMAL LOW (ref 39.0–52.0)
MCHC: 33.4 g/dL (ref 30.0–36.0)
Monocytes Absolute: 0.8 10*3/uL (ref 0.1–1.0)
Neutro Abs: 3.3 10*3/uL (ref 1.7–7.7)
Neutrophils Relative %: 46 % (ref 43–77)
RDW: 18 % — ABNORMAL HIGH (ref 11.5–15.5)

## 2013-07-25 LAB — COMPREHENSIVE METABOLIC PANEL
AST: 338 U/L — ABNORMAL HIGH (ref 0–37)
Albumin: 3.2 g/dL — ABNORMAL LOW (ref 3.5–5.2)
Calcium: 9.2 mg/dL (ref 8.4–10.5)
Chloride: 104 mEq/L (ref 96–112)
Creatinine, Ser: 0.96 mg/dL (ref 0.50–1.35)
Total Bilirubin: 0.4 mg/dL (ref 0.3–1.2)
Total Protein: 6.7 g/dL (ref 6.0–8.3)

## 2013-07-25 MED ORDER — VITAMIN B-1 100 MG PO TABS: 100.0000 mg | ORAL_TABLET | Freq: Every day | ORAL | Status: DC

## 2013-07-25 MED ORDER — LORAZEPAM 2 MG/ML IJ SOLN: 1.0000 mg | Freq: Once | INTRAMUSCULAR | Status: DC

## 2013-07-25 MED ORDER — LORAZEPAM 1 MG PO TABS: 0.0000 mg | ORAL_TABLET | Freq: Four times a day (QID) | ORAL | Status: DC

## 2013-07-25 MED ORDER — FOLIC ACID 1 MG PO TABS: 1.0000 mg | ORAL_TABLET | Freq: Every day | ORAL | Status: DC

## 2013-07-25 MED ORDER — LORAZEPAM 1 MG PO TABS: 0.0000 mg | ORAL_TABLET | Freq: Two times a day (BID) | ORAL | Status: DC

## 2013-07-25 MED ORDER — LORAZEPAM 1 MG PO TABS: 1.0000 mg | ORAL_TABLET | Freq: Four times a day (QID) | ORAL | Status: DC | PRN

## 2013-07-25 MED ORDER — SODIUM CHLORIDE 0.9 % IV BOLUS (SEPSIS)
1000.0000 mL | Freq: Once | INTRAVENOUS | Status: AC
  Administered 2013-07-25: 1000 mL via INTRAVENOUS

## 2013-07-25 MED ORDER — ADULT MULTIVITAMIN W/MINERALS CH: 1.0000 | ORAL_TABLET | Freq: Every day | ORAL | Status: DC

## 2013-07-25 MED ORDER — THIAMINE HCL 100 MG/ML IJ SOLN: 100.0000 mg | Freq: Every day | INTRAMUSCULAR | Status: DC

## 2013-07-25 MED ORDER — LORAZEPAM 2 MG/ML IJ SOLN: 1.0000 mg | Freq: Four times a day (QID) | INTRAMUSCULAR | Status: DC | PRN

## 2013-07-25 NOTE — ED Notes (Signed)
Patient keeps getting up off of stretcher trying to walk around.  Cautioned patient about falling and alerted [place] PA.  Called charge nurse and received orders from MD to take IV out and let patient leave premises.  Police escorted patient out of ED.

## 2013-07-25 NOTE — ED Notes (Signed)
Pt presenting to ed with c/o etoh picked up from bus station.

## 2013-07-25 NOTE — ED Provider Notes (Signed)
CSN: [id number]     Arrival date & time 07/25/13  1538 History     First MD Initiated Contact with Patient 07/25/13 1539     Chief Complaint  Patient presents with  . Alcohol Intoxication   (Consider location/radiation/quality/duration/timing/severity/associated sxs/prior Treatment) HPI Comments: Patient brought in today due to Alcohol Intoxication.  He was picked up from the bus station.  Unable to get any further information at this time from the patient due to intoxication.  When asked questions patient states, "what is your problem."  He will not state how much he has had to drink today.  Patient does not have any desire for alcohol detox at this time.  Denies SI or HI.  The history is provided by the patient.    Past Medical History  Diagnosis Date  . CHF (congestive heart failure)   . Alcohol abuse   . Seizure   . Noncompliance with medication treatment due to intermittent use of medication 02/02/2012  . HTN (hypertension) 02/02/2012  . Alcohol withdrawal seizure 02/02/2012  . Hypertension   . Dysrhythmia     paroxismal atrial fib  . Paroxysmal Atrial Flutter 02/01/2012  . Hepatitis C 02/02/2012    Unsure if he was ever diagnosed with Hep C  . Hepatitis C    Past Surgical History  Procedure Laterality Date  . Forearm reconstruction     Family History  Problem Relation Age of Onset  . Heart disease Father   . Alcohol abuse Father   . Alcohol abuse Brother   . Diabetes type II Mother    History  Substance Use Topics  . Smoking status: Never Smoker   . Smokeless tobacco: Never Used  . Alcohol Use: Yes     Comment: statrts drinking everyday at 5am drinks about 2 fiths been drinking for about 10 years    Review of Systems  Unable to perform ROS: Other    Allergies  Review of patient's allergies indicates no known allergies.  Home Medications   Current Outpatient Rx  Name  Route  Sig  Dispense  Refill  . aspirin 81 MG EC tablet   Oral   Take 1 tablet (81 mg  total) by mouth daily.   30 tablet   12   . diltiazem (DILACOR XR) 120 MG 24 hr capsule   Oral   Take 1 capsule (120 mg total) by mouth daily.   30 capsule   3   . folic acid (FOLVITE) 1 MG tablet   Oral   Take 1 tablet (1 mg total) by mouth daily.   30 tablet   12   . gabapentin (NEURONTIN) 300 MG capsule   Oral   Take 1 capsule (300 mg total) by mouth 3 (three) times daily.   90 capsule   0   . Multiple Vitamin (MULTIVITAMIN WITH MINERALS) TABS tablet   Oral   Take 1 tablet by mouth daily.         Marland Kitchen omega-3 acid ethyl esters (LOVAZA) 1 G capsule   Oral   Take 1 capsule (1 g total) by mouth 2 (two) times daily.   60 capsule   0   . phenytoin (DILANTIN) 100 MG ER capsule   Oral   Take 1 capsule (100 mg total) by mouth daily.         . traZODone (DESYREL) 50 MG tablet   Oral   Take 1 tablet (50 mg total) by mouth at bedtime and may repeat dose  one time if needed.          BP 113/66  Pulse 104  Temp(Src) 98.3 F (36.8 C) (Oral)  Resp 20  SpO2 95% Physical Exam  Nursing note and vitals reviewed. Constitutional: He is oriented to person, place, and time. He appears well-developed and well-nourished.  HENT:  Head: Normocephalic and atraumatic.  Neck: Normal range of motion. Neck supple.  Cardiovascular: Normal rate, regular rhythm and normal heart sounds.   Pulmonary/Chest: Effort normal and breath sounds normal.  Neurological: He is alert and oriented to person, place, and time.  Skin: Skin is warm and dry.  Psychiatric: He is agitated.    ED Course   Procedures (including critical care time)  Labs Reviewed - No data to display No results found. No diagnosis found.  MDM  Patient brought in today due to alcohol intoxication.  Patient denies any desire for detox.  Patient ambulating in the ED without difficulty.  Denies SI or HI.  Labs unremarkable.  Patient discharged.  Pascal Lux [place], PA-C 07/26/13 1346

## 2013-07-25 NOTE — Consult Note (Signed)
Agree with assessment and plan Keli Buehner A. Adele Milson, M.D. 

## 2013-07-25 NOTE — ED Notes (Signed)
Pt standing in hall talking loud, security at bedside, EDPA Heather notified pt cussing, being loud and able to ambulate alone. Pts IV d/c'd and pt escorted off property by [employer] and security, pt still had his own clothes own and his radio in his hand. Pt a/o, no distress noted

## 2013-07-26 NOTE — ED Provider Notes (Signed)
Medical screening examination/treatment/procedure(s) were performed by non-physician practitioner and as supervising physician I was immediately available for consultation/collaboration.   Benny Lennert, MD 07/26/13 [phone]

## 2013-07-26 NOTE — Progress Notes (Signed)
Patient Discharge Instructions:  After Visit Summary (AVS):   Faxed to:  07/26/13 Psychiatric Admission Assessment Note:   Faxed to:  07/26/13 Suicide Risk Assessment - Discharge Assessment:   Faxed to:  07/26/13 Faxed/Sent to the Next Level Care provider:  07/26/13 Faxed to [facility] @ [phone] Faxed to [facility] @ [phone]  Jerelene Redden, 07/26/2013, 2:33 PM

## 2013-08-02 NOTE — Discharge Summary (Signed)
Physician Discharge Summary Note  Patient:  Anthony Mcclure is an 61 y.o., male MRN:  [id number] DOB:  1951/01/30 Patient phone:  [phone] (home)  Patient address:   [facility] [address] [place] [address],   Date of Admission:  07/20/2013 Date of Discharge: 07/24/2013  Reason for Admission:  62 Y/O male who was asked by the court to get detox and go to a rehab program.  Discharge Diagnoses: Active Problems:   Alcohol dependence  Review of Systems  Constitutional: Negative.   HENT: Negative.   Eyes: Negative.   Respiratory: Negative.   Cardiovascular: Negative.   Gastrointestinal: Negative.   Genitourinary: Negative.   Musculoskeletal: Negative.   Skin: Negative.   Neurological: Negative.   Endo/Heme/Allergies: Negative.   Psychiatric/Behavioral: Positive for substance abuse.    DSM5:  Schizophrenia Disorders:   Obsessive-Compulsive Disorders:   Trauma-Stressor Disorders:   Substance/Addictive Disorders:  Alcohol Related Disorder - Severe (303.90) Depressive Disorders:    Axis Diagnosis:   AXIS I:  Alcohol Dependence, S/P withdrawal AXIS II:  Deferred AXIS III:   Past Medical History  Diagnosis Date  . CHF (congestive heart failure)   . Alcohol abuse   . Seizure   . Noncompliance with medication treatment due to intermittent use of medication 02/02/2012  . HTN (hypertension) 02/02/2012  . Alcohol withdrawal seizure 02/02/2012  . Hypertension   . Dysrhythmia     paroxismal atrial fib  . Paroxysmal Atrial Flutter 02/01/2012  . Hepatitis C 02/02/2012    Unsure if he was ever diagnosed with Hep C  . Hepatitis C    AXIS IV:  problems related to legal system/crime AXIS V:  51-60 moderate symptoms  Level of Care:  [facility]  Hospital Course:  He was admitted and started in individual and group psychotherapy. He was detox with Librium. Initially very reclusive minimized any problems with alcohol and justified it by stating that he is retired, and  likes to drink with his friends. He stated he was happy living like that. As the hospitalization progressed he became more involved in the unit and started expressing the desire to continue to abstain. He understood that if he was  not to comply he was going to serve time. He was more aware of the negative effects of alcohol in his body  Consults:  None  Significant Diagnostic Studies:  labs: as per the chart  Discharge Vitals:   Blood pressure 110/71, pulse 87, temperature 96.7 F (35.9 C), temperature source Oral, resp. rate 20, height 6' (1.829 m), weight 86.183 kg (190 lb). Body mass index is 25.76 kg/(m^2). Lab Results:   No results found for this or any previous visit (from the past 72 hour(s)).  Physical Findings: AIMS: Facial and Oral Movements Muscles of Facial Expression: None, normal Lips and Perioral Area: None, normal Jaw: None, normal Tongue: None, normal,Extremity Movements Upper (arms, wrists, hands, fingers): None, normal Lower (legs, knees, ankles, toes): None, normal, Trunk Movements Neck, shoulders, hips: None, normal, Overall Severity Severity of abnormal movements (highest score from questions above): None, normal Incapacitation due to abnormal movements: None, normal Patient's awareness of abnormal movements (rate only patient's report): No Awareness, Dental Status Current problems with teeth and/or dentures?: Yes Does patient usually wear dentures?: No  CIWA:  CIWA-Ar Total: 0 COWS:     Psychiatric Specialty Exam: See Psychiatric Specialty Exam and Suicide Risk Assessment completed by Attending Physician prior to discharge.  Discharge destination:  Other:  home and then [facility] the  following week  Is patient on multiple antipsychotic therapies at discharge:  No   Has Patient had three or more failed trials of antipsychotic monotherapy by history:  No  Recommended Plan for Multiple Antipsychotic Therapies: NA     Medication List       Indication    aspirin 81 MG EC tablet  Take 1 tablet (81 mg total) by mouth daily.   Indication:  Heart Attack, Blood Clot     diltiazem 120 MG 24 hr capsule  Commonly known as:  DILACOR XR  Take 1 capsule (120 mg total) by mouth daily.   Indication:  High Blood Pressure     folic acid 1 MG tablet  Commonly known as:  FOLVITE  Take 1 tablet (1 mg total) by mouth daily.   Indication:  Deficiency of Folic Acid in the Diet     gabapentin 300 MG capsule  Commonly known as:  NEURONTIN  Take 1 capsule (300 mg total) by mouth 3 (three) times daily.   Indication:  Mood stability     multivitamin with minerals Tabs tablet  Take 1 tablet by mouth daily.   Indication:  Vitamin Supplementation.     omega-3 acid ethyl esters 1 G capsule  Commonly known as:  LOVAZA  Take 1 capsule (1 g total) by mouth 2 (two) times daily.   Indication:  Mood stability     phenytoin 100 MG ER capsule  Commonly known as:  DILANTIN  Take 1 capsule (100 mg total) by mouth daily.   Indication:  Seizure     traZODone 50 MG tablet  Commonly known as:  DESYREL  Take 1 tablet (50 mg total) by mouth at bedtime and may repeat dose one time if needed.   Indication:  Trouble Sleeping           Follow-up Information   Follow up with [facility] On 08/07/2013. (Arrive by 8AM for admission with ID, 30 day medication supply (if you are taking medications), and clothing. )    Contact information:   [address]  [place], [place] [address] Phone: [phone] Fax: [phone]      Follow up with [facility]. (Walk in Monday through Friday between 8am-9am for hospital followup/medication management. )    Contact information:   [address], [place] [address] phone: [phone] fax: [phone]      Follow-up recommendations:  Activity:  as tolerated Diet:  regular  Comments:  Continue to work the relapse prevention plan, attend AA and be ready to be admitted to [facility] as planned  Total Discharge Time:   Greater than 30 minutes.  Signed: Ladislav Caselli A 08/02/2013, 8:32 PM

## 2013-08-03 ENCOUNTER — Inpatient Hospital Stay (HOSPITAL_COMMUNITY)
Admission: AD | Admit: 2013-08-03 | Discharge: 2013-08-07 | DRG: 897 | Disposition: A | Discharge status: Home / Self Care | Payer: Medicare Other | Source: Intra-hospital | Attending: Psychiatry | Admitting: Psychiatry

## 2013-08-03 ENCOUNTER — Encounter (HOSPITAL_COMMUNITY): Payer: Self-pay | Admitting: Emergency Medicine

## 2013-08-03 ENCOUNTER — Inpatient Hospital Stay (HOSPITAL_COMMUNITY): Admission: AD | Admit: 2013-08-03 | Payer: Medicare Other | Source: Intra-hospital | Admitting: Psychiatry

## 2013-08-03 ENCOUNTER — Emergency Department (HOSPITAL_COMMUNITY)
Admission: EM | Admit: 2013-08-03 | Discharge: 2013-08-03 | Disposition: A | Discharge status: Other Acute Inpatient Hospital | Payer: Medicare Other | Source: Home / Self Care | Attending: Emergency Medicine | Admitting: Emergency Medicine

## 2013-08-03 ENCOUNTER — Encounter (HOSPITAL_COMMUNITY): Payer: Self-pay

## 2013-08-03 DIAGNOSIS — F10239 Alcohol dependence with withdrawal, unspecified: Principal | ICD-10-CM | POA: Diagnosis present

## 2013-08-03 DIAGNOSIS — F1020 Alcohol dependence, uncomplicated: Secondary | ICD-10-CM | POA: Diagnosis present

## 2013-08-03 DIAGNOSIS — F10939 Alcohol use, unspecified with withdrawal, unspecified: Principal | ICD-10-CM | POA: Diagnosis present

## 2013-08-03 DIAGNOSIS — I509 Heart failure, unspecified: Secondary | ICD-10-CM | POA: Diagnosis present

## 2013-08-03 DIAGNOSIS — I4891 Unspecified atrial fibrillation: Secondary | ICD-10-CM | POA: Diagnosis present

## 2013-08-03 DIAGNOSIS — B1920 Unspecified viral hepatitis C without hepatic coma: Secondary | ICD-10-CM | POA: Diagnosis present

## 2013-08-03 DIAGNOSIS — Z79899 Other long term (current) drug therapy: Secondary | ICD-10-CM

## 2013-08-03 DIAGNOSIS — I10 Essential (primary) hypertension: Secondary | ICD-10-CM | POA: Diagnosis present

## 2013-08-03 LAB — CBC
HCT: 42.8 % (ref 39.0–52.0)
Hemoglobin: 14.4 g/dL (ref 13.0–17.0)
MCV: 81.8 fL (ref 78.0–100.0)
RBC: 5.23 MIL/uL (ref 4.22–5.81)
WBC: 7 10*3/uL (ref 4.0–10.5)

## 2013-08-03 LAB — ETHANOL: Alcohol, Ethyl (B): 219 mg/dL — ABNORMAL HIGH (ref 0–11)

## 2013-08-03 LAB — COMPREHENSIVE METABOLIC PANEL
Alkaline Phosphatase: 72 U/L (ref 39–117)
BUN: 11 mg/dL (ref 6–23)
CO2: 28 mEq/L (ref 19–32)
Chloride: 101 mEq/L (ref 96–112)
Creatinine, Ser: 0.8 mg/dL (ref 0.50–1.35)
GFR calc non Af Amer: >90 mL/min (ref >90)
Glucose, Bld: 97 mg/dL (ref 70–99)
Potassium: 4.1 mEq/L (ref 3.5–5.1)
Total Bilirubin: 0.6 mg/dL (ref 0.3–1.2)

## 2013-08-03 LAB — RAPID URINE DRUG SCREEN, HOSP PERFORMED: Opiates: NOT DETECTED

## 2013-08-03 MED ORDER — VITAMIN B-1 100 MG PO TABS
100.0000 mg | ORAL_TABLET | Freq: Every day | ORAL | Status: DC
  Administered 2013-08-03: 100 mg via ORAL
  Filled 2013-08-03: qty 1

## 2013-08-03 MED ORDER — CHLORDIAZEPOXIDE HCL 25 MG PO CAPS
25.0000 mg | ORAL_CAPSULE | Freq: Four times a day (QID) | ORAL | Status: DC | PRN
  Administered 2013-08-03: 25 mg via ORAL
  Filled 2013-08-03 (×2): qty 1

## 2013-08-03 MED ORDER — OMEGA-3-ACID ETHYL ESTERS 1 G PO CAPS
1.0000 g | ORAL_CAPSULE | Freq: Two times a day (BID) | ORAL | Status: DC
  Filled 2013-08-03: qty 1

## 2013-08-03 MED ORDER — TRAZODONE HCL 50 MG PO TABS: 50.0000 mg | ORAL_TABLET | Freq: Every evening | ORAL | Status: DC | PRN

## 2013-08-03 MED ORDER — ONDANSETRON 4 MG PO TBDP: 4.0000 mg | ORAL_TABLET | Freq: Four times a day (QID) | ORAL | Status: DC | PRN

## 2013-08-03 MED ORDER — ADULT MULTIVITAMIN W/MINERALS CH
1.0000 | ORAL_TABLET | Freq: Every day | ORAL | Status: DC
  Administered 2013-08-03: 1 via ORAL

## 2013-08-03 MED ORDER — CHLORDIAZEPOXIDE HCL 25 MG PO CAPS: 25.0000 mg | ORAL_CAPSULE | Freq: Four times a day (QID) | ORAL | Status: DC | PRN

## 2013-08-03 MED ORDER — PHENYTOIN 50 MG PO CHEW
100.0000 mg | CHEWABLE_TABLET | Freq: Every day | ORAL | Status: DC
  Administered 2013-08-03: 100 mg via ORAL
  Filled 2013-08-03: qty 2

## 2013-08-03 MED ORDER — CHLORDIAZEPOXIDE HCL 25 MG PO CAPS: 25.0000 mg | ORAL_CAPSULE | Freq: Every day | ORAL | Status: DC

## 2013-08-03 MED ORDER — LOPERAMIDE HCL 2 MG PO CAPS: 2.0000 mg | ORAL_CAPSULE | ORAL | Status: DC | PRN

## 2013-08-03 MED ORDER — MAGNESIUM HYDROXIDE 400 MG/5ML PO SUSP: 30.0000 mL | Freq: Every day | ORAL | Status: DC | PRN

## 2013-08-03 MED ORDER — ACETAMINOPHEN 325 MG PO TABS: 650.0000 mg | ORAL_TABLET | Freq: Four times a day (QID) | ORAL | Status: DC | PRN

## 2013-08-03 MED ORDER — ASPIRIN EC 81 MG PO TBEC
81.0000 mg | DELAYED_RELEASE_TABLET | Freq: Every day | ORAL | Status: DC
  Administered 2013-08-03: 81 mg via ORAL
  Filled 2013-08-03: qty 1

## 2013-08-03 MED ORDER — CHLORDIAZEPOXIDE HCL 25 MG PO CAPS: 25.0000 mg | ORAL_CAPSULE | Freq: Four times a day (QID) | ORAL | Status: DC

## 2013-08-03 MED ORDER — GABAPENTIN 300 MG PO CAPS
300.0000 mg | ORAL_CAPSULE | Freq: Three times a day (TID) | ORAL | Status: DC
  Administered 2013-08-03: 300 mg via ORAL
  Filled 2013-08-03 (×2): qty 1

## 2013-08-03 MED ORDER — CHLORDIAZEPOXIDE HCL 25 MG PO CAPS: 25.0000 mg | ORAL_CAPSULE | ORAL | Status: DC

## 2013-08-03 MED ORDER — ALUM & MAG HYDROXIDE-SIMETH 200-200-20 MG/5ML PO SUSP: 30.0000 mL | ORAL | Status: DC | PRN

## 2013-08-03 MED ORDER — CHLORDIAZEPOXIDE HCL 25 MG PO CAPS
25.0000 mg | ORAL_CAPSULE | Freq: Once | ORAL | Status: AC
  Administered 2013-08-03: 25 mg via ORAL
  Filled 2013-08-03: qty 1

## 2013-08-03 MED ORDER — CHLORDIAZEPOXIDE HCL 25 MG PO CAPS: 25.0000 mg | ORAL_CAPSULE | Freq: Three times a day (TID) | ORAL | Status: DC

## 2013-08-03 MED ORDER — HYDROXYZINE HCL 25 MG PO TABS: 25.0000 mg | ORAL_TABLET | Freq: Four times a day (QID) | ORAL | Status: DC | PRN

## 2013-08-03 MED ORDER — THIAMINE HCL 100 MG/ML IJ SOLN: 100.0000 mg | Freq: Once | INTRAMUSCULAR | Status: AC

## 2013-08-03 MED ORDER — ADULT MULTIVITAMIN W/MINERALS CH
1.0000 | ORAL_TABLET | Freq: Every day | ORAL | Status: DC
  Filled 2013-08-03: qty 1

## 2013-08-03 MED ORDER — DILTIAZEM HCL ER COATED BEADS 120 MG PO CP24
120.0000 mg | ORAL_CAPSULE | Freq: Every day | ORAL | Status: DC
  Administered 2013-08-03: 120 mg via ORAL
  Filled 2013-08-03: qty 1

## 2013-08-03 MED ORDER — FOLIC ACID 1 MG PO TABS
1.0000 mg | ORAL_TABLET | Freq: Every day | ORAL | Status: DC
  Administered 2013-08-03: 1 mg via ORAL
  Filled 2013-08-03: qty 1

## 2013-08-03 MED ORDER — TRAZODONE HCL 50 MG PO TABS
50.0000 mg | ORAL_TABLET | Freq: Every evening | ORAL | Status: DC | PRN
  Filled 2013-08-03 (×6): qty 1

## 2013-08-03 NOTE — ED Provider Notes (Signed)
Medical screening examination/treatment/procedure(s) were performed by non-physician practitioner and as supervising physician I was immediately available for consultation/collaboration.  Cammy Sanjurjo F Ryann Leavitt, MD 08/03/13 1832 

## 2013-08-03 NOTE — BH Assessment (Signed)
[facility] Assessment Progress Note  Pt's community ACTT staff asked to speak with Clinical research associate. Ander Slade Murdick [phone] tells writer that pt has been court ordered to go to detox and then go directly into a 30 day residential program. Pt signed consent to release info for [employer] and coworker [employer]. Please call one of them prior to pt disposition. Consent form placed in pt's chart.  Evette Cristal, [place] Assessment Counselor

## 2013-08-03 NOTE — ED Provider Notes (Signed)
CSN: [id number]     Arrival date & time 08/03/13  1045 History     First MD Initiated Contact with Patient 08/03/13 1148     Chief Complaint  Patient presents with  . detox   . Medical Clearance   (Consider location/radiation/quality/duration/timing/severity/associated sxs/prior Treatment) Patient is a 62 y.o. male presenting with intoxication. The history is provided by the patient. No language interpreter was used.  Alcohol Intoxication This is a recurrent problem. Pertinent negatives include no chills or fever. Associated symptoms comments: The patient is brought to the ED by [facility] (Joy Murdick and Fayrene Helper) for detox from alcohol. The patient has no immediate complaints, denies SI/HI and hallucinations. Per Louisa Second, the patient is going to [facility] on Monday for court ordered treatment. If he fails to go to treatment he will be incarcerated. He is here for detox prior to entering [facility]. .    Past Medical History  Diagnosis Date  . CHF (congestive heart failure)   . Alcohol abuse   . Seizure   . Noncompliance with medication treatment due to intermittent use of medication 02/02/2012  . HTN (hypertension) 02/02/2012  . Alcohol withdrawal seizure 02/02/2012  . Hypertension   . Dysrhythmia     paroxismal atrial fib  . Paroxysmal Atrial Flutter 02/01/2012  . Hepatitis C 02/02/2012    Unsure if he was ever diagnosed with Hep C  . Hepatitis C    Past Surgical History  Procedure Laterality Date  . Forearm reconstruction     Family History  Problem Relation Age of Onset  . Heart disease Father   . Alcohol abuse Father   . Alcohol abuse Brother   . Diabetes type II Mother    History  Substance Use Topics  . Smoking status: Never Smoker   . Smokeless tobacco: Never Used  . Alcohol Use: Yes     Comment: statrts drinking everyday at 5am drinks about 2 fiths been drinking for about 10 years    Review of Systems  Constitutional: Negative for fever and chills.   HENT: Negative.   Respiratory: Negative.   Cardiovascular: Negative.   Gastrointestinal: Negative.   Musculoskeletal: Negative.   Skin: Negative.   Neurological: Negative.     Allergies  Review of patient's allergies indicates no known allergies.  Home Medications   Current Outpatient Rx  Name  Route  Sig  Dispense  Refill  . Multiple Vitamin (MULTIVITAMIN WITH MINERALS) TABS tablet   Oral   Take 1 tablet by mouth daily.         Marland Kitchen omega-3 acid ethyl esters (LOVAZA) 1 G capsule   Oral   Take 1 capsule (1 g total) by mouth 2 (two) times daily.   60 capsule   0   . aspirin 81 MG EC tablet   Oral   Take 1 tablet (81 mg total) by mouth daily.   30 tablet   12   . diltiazem (DILACOR XR) 120 MG 24 hr capsule   Oral   Take 1 capsule (120 mg total) by mouth daily.   30 capsule   3   . folic acid (FOLVITE) 1 MG tablet   Oral   Take 1 tablet (1 mg total) by mouth daily.   30 tablet   12   . gabapentin (NEURONTIN) 300 MG capsule   Oral   Take 1 capsule (300 mg total) by mouth 3 (three) times daily.   90 capsule   0   .  phenytoin (DILANTIN) 100 MG ER capsule   Oral   Take 1 capsule (100 mg total) by mouth daily.         . traZODone (DESYREL) 50 MG tablet   Oral   Take 1 tablet (50 mg total) by mouth at bedtime and may repeat dose one time if needed.          BP 138/91  Pulse 95  Temp(Src) 98.3 F (36.8 C) (Oral)  Resp 20  SpO2 95% Physical Exam  Constitutional: He is oriented to person, place, and time. He appears well-developed and well-nourished.  Acutely intoxicated.  HENT:  Head: Normocephalic.  Neck: Normal range of motion. Neck supple.  Cardiovascular: Normal rate and regular rhythm.   Pulmonary/Chest: Effort normal and breath sounds normal.  Abdominal: Soft. Bowel sounds are normal. There is no tenderness. There is no rebound and no guarding.  Musculoskeletal: Normal range of motion.  Neurological: He is alert and oriented to person, place,  and time.  Skin: Skin is warm and dry. No rash noted.  Psychiatric: He has a normal mood and affect.    ED Course   Procedures (including critical care time)  Labs Reviewed  CBC - Abnormal; Notable for the following:    RDW 17.9 (*)    All other components within normal limits  COMPREHENSIVE METABOLIC PANEL - Abnormal; Notable for the following:    AST 222 (*)    ALT 178 (*)    All other components within normal limits  ETHANOL - Abnormal; Notable for the following:    Alcohol, Ethyl (B) 219 (*)    All other components within normal limits  URINE RAPID DRUG SCREEN (HOSP PERFORMED) - Abnormal; Notable for the following:    Benzodiazepines POSITIVE (*)    All other components within normal limits   Results for orders placed during the hospital encounter of 08/03/13  CBC      Result Value Range   WBC 7.0  4.0 - 10.5 K/uL   RBC 5.23  4.22 - 5.81 MIL/uL   Hemoglobin 14.4  13.0 - 17.0 g/dL   HCT 27.2  53.6 - 64.4 %   MCV 81.8  78.0 - 100.0 fL   MCH 27.5  26.0 - 34.0 pg   MCHC 33.6  30.0 - 36.0 g/dL   RDW 03.4 (*) 74.2 - 59.5 %   Platelets 214  150 - 400 K/uL  COMPREHENSIVE METABOLIC PANEL      Result Value Range   Sodium 139  135 - 145 mEq/L   Potassium 4.1  3.5 - 5.1 mEq/L   Chloride 101  96 - 112 mEq/L   CO2 28  19 - 32 mEq/L   Glucose, Bld 97  70 - 99 mg/dL   BUN 11  6 - 23 mg/dL   Creatinine, Ser 6.38  0.50 - 1.35 mg/dL   Calcium 9.3  8.4 - 75.6 mg/dL   Total Protein 8.0  6.0 - 8.3 g/dL   Albumin 3.9  3.5 - 5.2 g/dL   AST 433 (*) 0 - 37 U/L   ALT 178 (*) 0 - 53 U/L   Alkaline Phosphatase 72  39 - 117 U/L   Total Bilirubin 0.6  0.3 - 1.2 mg/dL   GFR calc non Af Amer >90  >90 mL/min   GFR calc Af Amer >90  >90 mL/min  ETHANOL      Result Value Range   Alcohol, Ethyl (B) 219 (*) 0 - 11 mg/dL  URINE RAPID DRUG SCREEN (HOSP PERFORMED)      Result Value Range   Opiates NONE DETECTED  NONE DETECTED   Cocaine NONE DETECTED  NONE DETECTED   Benzodiazepines POSITIVE  (*) NONE DETECTED   Amphetamines NONE DETECTED  NONE DETECTED   Tetrahydrocannabinol NONE DETECTED  NONE DETECTED   Barbiturates NONE DETECTED  NONE DETECTED    No results found. No diagnosis found. 1. Alcohol intoxication MDM  The patient has a bed arranged at [facility] for admission to 30-day program on Monday 08-07-13. Joy brings patient to ED for detox until Monday when he can be transferred. Discussed that if discharged prior to Monday she would be contacted for further assistance. [phone].  Arnoldo Hooker, PA-C 08/03/13 1700

## 2013-08-03 NOTE — Consult Note (Signed)
[facility] Psychiatry Consult   Reason for Consult:  Evaluation for inpatient treatment  Referring Physician:  EDP  Anthony Mcclure is an 62 y.o. male.  Assessment: AXIS I:  Alcohol Abuse and Alcohol Dependence , Alcohol Withdrawal  AXIS II:  Deferred AXIS III:   Past Medical History  Diagnosis Date  . CHF (congestive heart failure)   . Alcohol abuse   . Seizure   . Noncompliance with medication treatment due to intermittent use of medication 02/02/2012  . HTN (hypertension) 02/02/2012  . Alcohol withdrawal seizure 02/02/2012  . Hypertension   . Dysrhythmia     paroxismal atrial fib  . Paroxysmal Atrial Flutter 02/01/2012  . Hepatitis C 02/02/2012    Unsure if he was ever diagnosed with Hep C  . Hepatitis C    AXIS IV:  other psychosocial or environmental problems, problems related to legal system/crime, problems related to social environment and problems with primary support group AXIS V:  41-50 serious symptoms  Plan:  Recommend inpatient detox   Subjective:   Anthony Mcclure is a 62 y.o. male   HPI:  Patient presents to [facility] voluntarily requesting alcohol detox.  Patient states that he was ordered by the court to do a 30 day detox and rehab program.  Patient states that he was just discharged for detox form [facility] but he is unable to stop drinking if he is on his own.   Patient is alert and oriented, Gait slightly unsteady related to being intoxicated.  Patient states that he lives in a motel room and drinks alcohol everyday all day.  Patient states that he can't help it.  Patient is to report to the rehab residency program with [facility] on Monday 08/07/2013 but must first be detox before the facility will accept him.  HPI Elements:   Location:  [facility] ED. Quality:  Affecting the patient mentally, physicall, and leagally. Severity:  Alcohol abuse/intoxication/withdrawal.  Past Psychiatric History: Past Medical History  Diagnosis Date  . CHF (congestive heart failure)    . Alcohol abuse   . Seizure   . Noncompliance with medication treatment due to intermittent use of medication 02/02/2012  . HTN (hypertension) 02/02/2012  . Alcohol withdrawal seizure 02/02/2012  . Hypertension   . Dysrhythmia     paroxismal atrial fib  . Paroxysmal Atrial Flutter 02/01/2012  . Hepatitis C 02/02/2012    Unsure if he was ever diagnosed with Hep C  . Hepatitis C     reports that he has never smoked. He has never used smokeless tobacco. He reports that  drinks alcohol. He reports that he uses illicit drugs. Family History  Problem Relation Age of Onset  . Heart disease Father   . Alcohol abuse Father   . Alcohol abuse Brother   . Diabetes type II Mother            Allergies:  No Known Allergies  Past Psychiatric History: Diagnosis:  Alcohol abuse/dependence/withdrawal   Hospitalizations:  [facility] 8/14  Outpatient Care:  [facility]  Substance Abuse Care:  Follow up with inpatient rehab [facility] scheduled for Monday 0/25/2014  Self-Mutilation:  Denies  Suicidal Attempts:  Denies  Violent Behaviors:  Prior history   Objective: Blood pressure 138/91, pulse 95, temperature 98.3 F (36.8 C), temperature source Oral, resp. rate 20, SpO2 95.00%.There is no weight on file to calculate BMI. Results for orders placed during the hospital encounter of 08/03/13 (from the past 72 hour(s))  CBC  Status: Abnormal   Collection Time    08/03/13 11:43 AM      Result Value Range   WBC 7.0  4.0 - 10.5 K/uL   RBC 5.23  4.22 - 5.81 MIL/uL   Hemoglobin 14.4  13.0 - 17.0 g/dL   HCT 40.9  81.1 - 91.4 %   MCV 81.8  78.0 - 100.0 fL   MCH 27.5  26.0 - 34.0 pg   MCHC 33.6  30.0 - 36.0 g/dL   RDW 78.2 (*) 95.6 - 21.3 %   Platelets 214  150 - 400 K/uL  COMPREHENSIVE METABOLIC PANEL     Status: Abnormal   Collection Time    08/03/13 11:43 AM      Result Value Range   Sodium 139  135 - 145 mEq/L   Potassium 4.1  3.5 - 5.1 mEq/L   Chloride 101  96 - 112 mEq/L   CO2 28  19 - 32  mEq/L   Glucose, Bld 97  70 - 99 mg/dL   BUN 11  6 - 23 mg/dL   Creatinine, Ser 0.86  0.50 - 1.35 mg/dL   Calcium 9.3  8.4 - 57.8 mg/dL   Total Protein 8.0  6.0 - 8.3 g/dL   Albumin 3.9  3.5 - 5.2 g/dL   AST 469 (*) 0 - 37 U/L   ALT 178 (*) 0 - 53 U/L   Alkaline Phosphatase 72  39 - 117 U/L   Total Bilirubin 0.6  0.3 - 1.2 mg/dL   GFR calc non Af Amer >90  >90 mL/min   GFR calc Af Amer >90  >90 mL/min   Comment: (NOTE)     The eGFR has been calculated using the CKD EPI equation.     This calculation has not been validated in all clinical situations.     eGFR's persistently <90 mL/min signify possible Chronic Kidney     Disease.  ETHANOL     Status: Abnormal   Collection Time    08/03/13 11:43 AM      Result Value Range   Alcohol, Ethyl (B) 219 (*) 0 - 11 mg/dL   Comment:            LOWEST DETECTABLE LIMIT FOR     SERUM ALCOHOL IS 11 mg/dL     FOR MEDICAL PURPOSES ONLY  URINE RAPID DRUG SCREEN (HOSP PERFORMED)     Status: Abnormal   Collection Time    08/03/13 12:06 PM      Result Value Range   Opiates NONE DETECTED  NONE DETECTED   Cocaine NONE DETECTED  NONE DETECTED   Benzodiazepines POSITIVE (*) NONE DETECTED   Amphetamines NONE DETECTED  NONE DETECTED   Tetrahydrocannabinol NONE DETECTED  NONE DETECTED   Barbiturates NONE DETECTED  NONE DETECTED   Comment:            DRUG SCREEN FOR MEDICAL PURPOSES     ONLY.  IF CONFIRMATION IS NEEDED     FOR ANY PURPOSE, NOTIFY LAB     WITHIN 5 DAYS.                LOWEST DETECTABLE LIMITS     FOR URINE DRUG SCREEN     Drug Class       Cutoff (ng/mL)     Amphetamine      1000     Barbiturate      200     Benzodiazepine   200  Tricyclics       300     Opiates          300     Cocaine          300     THC              50   No current facility-administered medications for this encounter.   Current Outpatient Prescriptions  Medication Sig Dispense Refill  . Multiple Vitamin (MULTIVITAMIN WITH MINERALS) TABS tablet Take 1  tablet by mouth daily.      Marland Kitchen omega-3 acid ethyl esters (LOVAZA) 1 G capsule Take 1 capsule (1 g total) by mouth 2 (two) times daily.  60 capsule  0  . aspirin 81 MG EC tablet Take 1 tablet (81 mg total) by mouth daily.  30 tablet  12  . diltiazem (DILACOR XR) 120 MG 24 hr capsule Take 1 capsule (120 mg total) by mouth daily.  30 capsule  3  . folic acid (FOLVITE) 1 MG tablet Take 1 tablet (1 mg total) by mouth daily.  30 tablet  12  . gabapentin (NEURONTIN) 300 MG capsule Take 1 capsule (300 mg total) by mouth 3 (three) times daily.  90 capsule  0  . phenytoin (DILANTIN) 100 MG ER capsule Take 1 capsule (100 mg total) by mouth daily.      . traZODone (DESYREL) 50 MG tablet Take 1 tablet (50 mg total) by mouth at bedtime and may repeat dose one time if needed.        Psychiatric Specialty Exam:     Blood pressure 138/91, pulse 95, temperature 98.3 F (36.8 C), temperature source Oral, resp. rate 20, SpO2 95.00%.There is no weight on file to calculate BMI.  General Appearance: Casual and Disheveled  Eye Contact::  Good  Speech:  Slurred  Volume:  Increased  Mood:  Anxious  Affect:  Blunt and Non-Congruent  Thought Process:  Circumstantial and Linear  Orientation:  Full (Time, Place, and Person)  Thought Content:  Rumination  Suicidal Thoughts:  No  Homicidal Thoughts:  No  Memory:  Immediate;   Good Recent;   Good Remote;   Good  Judgement:  Impaired  Insight:  Lacking  Psychomotor Activity:  Restlessness  Concentration:  Fair  Recall:  Good  Akathisia:  No  Handed:  Right  AIMS (if indicated):     Assets:  Communication Skills  Sleep:     Face to face interview and consult with Dr. Ladona Ridgel and Dr. Dub Mikes Treatment Plan Summary: Inpatient detox Recommendation/Disposition:  Inpatient treatment for detox.   1. Admit for crisis management and stabilization.  2. Review and initiate  medications pertinent to patient illness and treatment.  3. Medication management to reduce  current symptoms to base line and improve the         patient's overall level of functioning.   Start Librium Protocol Assunta Found, FNP-BC 08/03/2013 3:01 PM

## 2013-08-03 NOTE — ED Notes (Signed)
PT BELONGINGS IN LOCKER 30 

## 2013-08-03 NOTE — ED Notes (Signed)
Pt presenting to ed with c/o here with act team member requesting detox pt is here with Louisa Second [phone] and Fayrene Helper [phone] another person that works with [facility]

## 2013-08-03 NOTE — BH Assessment (Signed)
Assessment Note  Anthony Mcclure is an 61 y.o. male.   Pt's community ACTT staff asked to speak with Clinical research associate. Ander Slade Murdick [phone] tells writer that pt has been court ordered to go to detox and then go directly into a 30 day residential program. Pt signed consent to release info for [employer] and coworker [employer]. Please call one of them prior to pt disposition. Consent form placed in pt's chart.  Evette Cristal, [place]  Assessment Counselor

## 2013-08-03 NOTE — ED Notes (Addendum)
Pt signed [facility] form allowing his ACT team access to his medical information.  Act team Joy Murdick [phone]

## 2013-08-03 NOTE — Progress Notes (Signed)
Patient ID: Anthony Mcclure, male   DOB: May 05, 1951, 62 y.o.   MRN: [id number]  Pt skin search done and brought back to the unit. Report given to RN that admission needs to be finished. Skin search done and pt found to have scars on Chest, R-elbow, leg, knee and calf. Pt has swollen feet. Pt was court ordered to go to detox.

## 2013-08-04 ENCOUNTER — Encounter (HOSPITAL_COMMUNITY): Payer: Self-pay | Admitting: Psychiatry

## 2013-08-04 DIAGNOSIS — F1020 Alcohol dependence, uncomplicated: Secondary | ICD-10-CM

## 2013-08-04 LAB — COMPREHENSIVE METABOLIC PANEL
ALT: 143 U/L — ABNORMAL HIGH (ref 0–53)
AST: 156 U/L — ABNORMAL HIGH (ref 0–37)
Alkaline Phosphatase: 71 U/L (ref 39–117)
CO2: 31 mEq/L (ref 19–32)
Chloride: 99 mEq/L (ref 96–112)
GFR calc non Af Amer: >90 mL/min (ref >90)
Potassium: 3.9 mEq/L (ref 3.5–5.1)
Sodium: 137 mEq/L (ref 135–145)
Total Bilirubin: 1.5 mg/dL — ABNORMAL HIGH (ref 0.3–1.2)

## 2013-08-04 LAB — PROTIME-INR: INR: 1.03 (ref 0.00–1.49)

## 2013-08-04 MED ORDER — VITAMIN B-1 100 MG PO TABS
100.0000 mg | ORAL_TABLET | Freq: Every day | ORAL | Status: DC
  Administered 2013-08-05 – 2013-08-06 (×2): 100 mg via ORAL
  Filled 2013-08-04 (×4): qty 1

## 2013-08-04 MED ORDER — TRAZODONE HCL 50 MG PO TABS
50.0000 mg | ORAL_TABLET | Freq: Every evening | ORAL | Status: DC | PRN
  Administered 2013-08-04 – 2013-08-06 (×3): 50 mg via ORAL
  Filled 2013-08-04: qty 1
  Filled 2013-08-04: qty 28
  Filled 2013-08-04 (×3): qty 1
  Filled 2013-08-04: qty 28
  Filled 2013-08-04 (×4): qty 1

## 2013-08-04 MED ORDER — THIAMINE HCL 100 MG/ML IJ SOLN
100.0000 mg | Freq: Once | INTRAMUSCULAR | Status: AC
  Administered 2013-08-04: 100 mg via INTRAMUSCULAR

## 2013-08-04 MED ORDER — CHLORDIAZEPOXIDE HCL 25 MG PO CAPS
25.0000 mg | ORAL_CAPSULE | Freq: Four times a day (QID) | ORAL | Status: AC
  Administered 2013-08-04 (×3): 25 mg via ORAL
  Filled 2013-08-04 (×3): qty 1

## 2013-08-04 MED ORDER — PHENYTOIN SODIUM EXTENDED 100 MG PO CAPS
100.0000 mg | ORAL_CAPSULE | Freq: Every day | ORAL | Status: DC
  Administered 2013-08-04 – 2013-08-06 (×3): 100 mg via ORAL
  Filled 2013-08-04: qty 14
  Filled 2013-08-04 (×5): qty 1

## 2013-08-04 MED ORDER — ADULT MULTIVITAMIN W/MINERALS CH
1.0000 | ORAL_TABLET | Freq: Every day | ORAL | Status: DC
  Administered 2013-08-04 – 2013-08-06 (×3): 1 via ORAL
  Filled 2013-08-04 (×5): qty 1
  Filled 2013-08-04: qty 14

## 2013-08-04 MED ORDER — CHLORDIAZEPOXIDE HCL 25 MG PO CAPS
25.0000 mg | ORAL_CAPSULE | Freq: Four times a day (QID) | ORAL | Status: DC | PRN
  Administered 2013-08-04: 25 mg via ORAL
  Filled 2013-08-04: qty 1

## 2013-08-04 MED ORDER — HYDROXYZINE HCL 25 MG PO TABS: 25.0000 mg | ORAL_TABLET | Freq: Four times a day (QID) | ORAL | Status: DC | PRN

## 2013-08-04 MED ORDER — LOPERAMIDE HCL 2 MG PO CAPS: 2.0000 mg | ORAL_CAPSULE | ORAL | Status: DC | PRN

## 2013-08-04 MED ORDER — OMEGA-3-ACID ETHYL ESTERS 1 G PO CAPS
1.0000 g | ORAL_CAPSULE | Freq: Two times a day (BID) | ORAL | Status: DC
  Administered 2013-08-04 – 2013-08-06 (×5): 1 g via ORAL
  Filled 2013-08-04: qty 1
  Filled 2013-08-04 (×2): qty 28
  Filled 2013-08-04 (×7): qty 1

## 2013-08-04 MED ORDER — CHLORDIAZEPOXIDE HCL 25 MG PO CAPS
25.0000 mg | ORAL_CAPSULE | ORAL | Status: AC
  Administered 2013-08-06 (×2): 25 mg via ORAL
  Filled 2013-08-04 (×2): qty 1

## 2013-08-04 MED ORDER — CHLORDIAZEPOXIDE HCL 25 MG PO CAPS
25.0000 mg | ORAL_CAPSULE | Freq: Three times a day (TID) | ORAL | Status: AC
  Administered 2013-08-05 (×3): 25 mg via ORAL
  Filled 2013-08-04 (×2): qty 1

## 2013-08-04 MED ORDER — FOLIC ACID 1 MG PO TABS
1.0000 mg | ORAL_TABLET | Freq: Every day | ORAL | Status: DC
  Administered 2013-08-04 – 2013-08-06 (×3): 1 mg via ORAL
  Filled 2013-08-04: qty 14
  Filled 2013-08-04 (×5): qty 1

## 2013-08-04 MED ORDER — ONDANSETRON 4 MG PO TBDP: 4.0000 mg | ORAL_TABLET | Freq: Four times a day (QID) | ORAL | Status: DC | PRN

## 2013-08-04 MED ORDER — CHLORDIAZEPOXIDE HCL 25 MG PO CAPS: 25.0000 mg | ORAL_CAPSULE | Freq: Every day | ORAL | Status: DC

## 2013-08-04 MED ORDER — GABAPENTIN 300 MG PO CAPS
300.0000 mg | ORAL_CAPSULE | Freq: Three times a day (TID) | ORAL | Status: DC
Start: 2013-08-04 — End: 2013-08-07
  Administered 2013-08-04 – 2013-08-06 (×7): 300 mg via ORAL
  Filled 2013-08-04 (×3): qty 1
  Filled 2013-08-04: qty 42
  Filled 2013-08-04 (×5): qty 1
  Filled 2013-08-04: qty 42
  Filled 2013-08-04 (×3): qty 1
  Filled 2013-08-04: qty 42

## 2013-08-04 MED ORDER — DILTIAZEM HCL ER 120 MG PO CP24
120.0000 mg | ORAL_CAPSULE | Freq: Every day | ORAL | Status: DC
  Administered 2013-08-04 – 2013-08-06 (×3): 120 mg via ORAL
  Filled 2013-08-04 (×5): qty 1
  Filled 2013-08-04: qty 14

## 2013-08-04 NOTE — [facility] Suicide Risk Assessment (Signed)
[facility] INPATIENT: Family/Significant Other Suicide Prevention Education  Suicide Prevention Education:  Education Completed; No one has been identified by the patient as the family member/significant other with whom the patient will be residing, and identified as the person(s) who will aid the patient in the event of a mental health crisis (suicidal ideations/suicide attempt). With written consent from the patient, the family member/significant other has been provided the following suicide prevention education, prior to the and/or following the discharge of the patient.  The suicide prevention education provided includes the following:  Suicide risk factors  Suicide prevention and interventions  National Suicide Hotline telephone number  [facility] assessment telephone number  [facility] Emergency Assistance 911  [facility] and/or Residential Mobile Crisis Unit telephone number Request made of family/significant other to:  Remove weapons (e.g., guns, rifles, knives), all items previously/currently identified as safety concern.  Remove drugs/medications (over-the-counter, prescriptions, illicit drugs), all items previously/currently identified as a safety concern. The family member/significant other verbalizes understanding of the suicide prevention education information provided. The family member/significant other agrees to remove the items of safety concern listed above. Pt did not c/o SI at admission, nor have they endorsed SI during their stay here. SPE not required.  Anthony Mcclure, LCSWA 08/04/2013  1:06 PM

## 2013-08-04 NOTE — Progress Notes (Signed)
Adult Psychoeducational Group Note  Date:  08/04/2013 Time:  1:49 PM  Group Topic/Focus:  Relapse Prevention Planning:   The focus of this group is to define relapse and discuss the need for planning to combat relapse.  Participation Level:  Did Not Attend  Additional Comments:  Pt remained is his bed during group.  Reinaldo Raddle K 08/04/2013, 1:49 PM

## 2013-08-04 NOTE — Progress Notes (Signed)
Adult Psychoeducational Group Note  Date:  08/04/2013 Time:  10:08 PM  Group Topic/Focus: AA Support Group  Participation Level:  Active  Cephas Revard Michele 08/04/2013, 10:08 PM 

## 2013-08-04 NOTE — Tx Team (Signed)
Interdisciplinary Treatment Plan Update (Adult)  Date: 08/04/2013  Time Reviewed:  9:45 AM  Progress in Treatment: Attending groups: Yes Participating in groups:  Yes Taking medication as prescribed:  Yes Tolerating medication:  Yes Family/Significant othe contact made: CSW assessing Patient understands diagnosis:  Yes Discussing patient identified problems/goals with staff:  Yes Medical problems stabilized or resolved:  Yes Denies suicidal/homicidal ideation: Yes Issues/concerns per patient self-inventory:  Yes Other:  New problem(s) identified: N/A  Discharge Plan or Barriers: Pt is scheduled to go to [facility] for further treatment on Monday.   Reason for Continuation of Hospitalization: Alcohol Detox  Comments: N/A  Estimated length of stay: 3 days  For review of initial/current patient goals, please see plan of care.  Attendees: Patient:     Family:     Physician:  Dr. Dub Mikes 08/04/2013 10:41 AM   Nursing:   Burnetta Sabin, RN 08/04/2013 10:41 AM   Clinical Social Worker:  Reyes Ivan, LCSWA 08/04/2013 10:41 AM   Other: Waynetta Sandy, RN 08/04/2013 10:41 AM   Other:  Trula Slade, LCSWA 08/04/2013 10:41 AM   Other:  Rebecca Eaton, NP 08/04/2013 10:42 AM   Other:     Other:    Other:    Other:    Other:    Other:    Other:     Scribe for Treatment Team:   Carmina Miller, 08/04/2013 , 10:41 AM

## 2013-08-04 NOTE — H&P (Signed)
Psychiatric Admission Assessment Adult  Patient Identification:  Anthony Mcclure Date of Evaluation:  08/04/2013 Chief Complaint:  ALCOHOL DEPENDENCE History of Present Illness:: Anthony Mcclure returns for detox. He states he went back to drinking after he left our unit. He was reminded that he was court ordered to do a 30 day residential treatment program and that he has the appointment at [facility] the 25 th (monday) He came to be detox in order to bed admitted there. If he was not to comply with the court order, he is going to spend time imprisoned due to assault charges pending from years ago. He has stated that he likes to drink and that he has been drinking since he was 62 Y/O and that he is retired and he drinks with his friends Elements:  Location:  in patient . Quality:  back to drinking every day. Severity:  moderate-severe. Timing:  every day. Duration:  years. Context:  alcohol dependent, court ordered to pursue treatment. Associated Signs/Synptoms: Depression Symptoms:  Denies (Hypo) Manic Symptoms:  Denies Anxiety Symptoms:  Denies Psychotic Symptoms:  Denies PTSD Symptoms: Denies   Psychiatric Specialty Exam: Physical Exam  Review of Systems  Constitutional: Negative.   HENT: Negative.   Eyes: Negative.   Respiratory: Negative.   Cardiovascular: Negative.   Gastrointestinal: Negative.   Genitourinary: Negative.   Musculoskeletal: Positive for back pain and joint pain.       Leg  Skin: Negative.   Neurological: Negative.   Endo/Heme/Allergies: Negative.   Psychiatric/Behavioral: Positive for substance abuse. The patient is nervous/anxious.     Blood pressure 166/103, pulse 76, temperature 98.4 F (36.9 C), temperature source Oral, resp. rate 16, height 5' 9.29" (1.76 m), weight 89.359 kg (197 lb).Body mass index is 28.85 kg/(m^2).  General Appearance: Disheveled  Eye Solicitor::  Fair  Speech:  articulation problems secondary to missing teeth  Volume:  Increased  Mood:   expansive  Affect:  Labile  Thought Process:  Coherent and Goal Directed  Orientation:  Full (Time, Place, and Person)  Thought Content:  events after he was D/C, his needing to go to rehab on Monday  Suicidal Thoughts:  No  Homicidal Thoughts:  No  Memory:  Immediate;   Fair Recent;   Fair Remote;   Fair  Judgement:  Poor  Insight:  Shallow  Psychomotor Activity:  Restlessness  Concentration:  Fair  Recall:  Fair  Akathisia:  No  Handed:  Right  AIMS (if indicated):     Assets:  Desire for Improvement  Sleep:  Number of Hours: 5.25    Past Psychiatric History: Diagnosis:  Hospitalizations:  Outpatient Care:  Substance Abuse Care:  Self-Mutilation:  Suicidal Attempts:  Violent Behaviors:   Past Medical History:   Past Medical History  Diagnosis Date  . CHF (congestive heart failure)   . Alcohol abuse   . Seizure   . Noncompliance with medication treatment due to intermittent use of medication 02/02/2012  . HTN (hypertension) 02/02/2012  . Alcohol withdrawal seizure 02/02/2012  . Hypertension   . Dysrhythmia     paroxismal atrial fib  . Paroxysmal Atrial Flutter 02/01/2012  . Hepatitis C 02/02/2012    Unsure if he was ever diagnosed with Hep C  . Hepatitis C    Loss of Consciousness:  fights Seizure History:  possible withdrawal Traumatic Brain Injury:  fights Allergies:  No Known Allergies PTA Medications: Prescriptions prior to admission  Medication Sig Dispense Refill  . aspirin 81 MG EC tablet Take  81 mg by mouth daily.      Marland Kitchen diltiazem (DILACOR XR) 120 MG 24 hr capsule Take 120 mg by mouth daily.      . folic acid (FOLVITE) 1 MG tablet Take 1 mg by mouth daily.      Marland Kitchen gabapentin (NEURONTIN) 300 MG capsule Take 1 capsule (300 mg total) by mouth 3 (three) times daily.  90 capsule  0  . Multiple Vitamin (MULTIVITAMIN WITH MINERALS) TABS tablet Take 1 tablet by mouth daily.      Marland Kitchen omega-3 acid ethyl esters (LOVAZA) 1 G capsule Take 1 capsule (1 g total) by mouth  2 (two) times daily.  60 capsule  0  . phenytoin (DILANTIN) 100 MG ER capsule Take 100 mg by mouth daily.      . traZODone (DESYREL) 50 MG tablet Take 50 mg by mouth at bedtime and may repeat dose one time if needed.      . [DISCONTINUED] aspirin 81 MG EC tablet Take 1 tablet (81 mg total) by mouth daily.  30 tablet  12  . [DISCONTINUED] diltiazem (DILACOR XR) 120 MG 24 hr capsule Take 1 capsule (120 mg total) by mouth daily.  30 capsule  3  . [DISCONTINUED] folic acid (FOLVITE) 1 MG tablet Take 1 tablet (1 mg total) by mouth daily.  30 tablet  12  . [DISCONTINUED] phenytoin (DILANTIN) 100 MG ER capsule Take 1 capsule (100 mg total) by mouth daily.      . [DISCONTINUED] traZODone (DESYREL) 50 MG tablet Take 1 tablet (50 mg total) by mouth at bedtime and may repeat dose one time if needed.        Previous Psychotropic Medications:  Medication/Dose                 Substance Abuse History in the last 12 months:  yes  Consequences of Substance Abuse: Legal Consequences:  fights while intoxicated Blackouts:   Withdrawal Symptoms:   Diaphoresis Nausea Tremors  Social History:  reports that he has never smoked. He has never used smokeless tobacco. He reports that  drinks alcohol. He reports that he uses illicit drugs ("Crack" cocaine). Additional Social History:                      Current Place of Residence:   Place of Birth:   Family Members: Marital Status:  Widowed Children:  Sons:  Daughters: Relationships: Education:  [employer] Problems/Performance: Religious Beliefs/Practices: History of Abuse (Emotional/Phsycial/Sexual) Teacher, music History:  None. Legal History: Hobbies/Interests:  Family History:   Family History  Problem Relation Age of Onset  . Heart disease Father   . Alcohol abuse Father   . Alcohol abuse Brother   . Diabetes type II Mother     Results for orders placed during the hospital encounter of  08/03/13 (from the past 72 hour(s))  PROTIME-INR     Status: None   Collection Time    08/04/13  6:20 AM      Result Value Range   Prothrombin Time 13.3  11.6 - 15.2 seconds   INR 1.03  0.00 - 1.49   Comment: Performed at [facility]  COMPREHENSIVE METABOLIC PANEL     Status: Abnormal   Collection Time    08/04/13  6:20 AM      Result Value Range   Sodium 137  135 - 145 mEq/L   Potassium 3.9  3.5 - 5.1 mEq/L   Chloride 99  96 - 112 mEq/L   CO2 31  19 - 32 mEq/L   Glucose, Bld 86  70 - 99 mg/dL   BUN 12  6 - 23 mg/dL   Creatinine, Ser 9.60  0.50 - 1.35 mg/dL   Calcium 9.2  8.4 - 45.4 mg/dL   Total Protein 7.3  6.0 - 8.3 g/dL   Albumin 3.4 (*) 3.5 - 5.2 g/dL   AST 098 (*) 0 - 37 U/L   ALT 143 (*) 0 - 53 U/L   Alkaline Phosphatase 71  39 - 117 U/L   Total Bilirubin 1.5 (*) 0.3 - 1.2 mg/dL   GFR calc non Af Amer >90  >90 mL/min   GFR calc Af Amer >90  >90 mL/min   Comment: (NOTE)     The eGFR has been calculated using the CKD EPI equation.     This calculation has not been validated in all clinical situations.     eGFR's persistently <90 mL/min signify possible Chronic Kidney     Disease.     Performed at [facility]  AMMONIA     Status: None   Collection Time    08/04/13  6:20 AM      Result Value Range   Ammonia 55  11 - 60 umol/L   Comment: Performed at [facility]   Psychological Evaluations:  Assessment:   DSM5:  Schizophrenia Disorders:   Obsessive-Compulsive Disorders:   Trauma-Stressor Disorders:   Substance/Addictive Disorders:  Alcohol Related Disorder - Severe (303.90) Depressive Disorders:    AXIS I:  Alcohol Dependence AXIS II:  Deferred AXIS III:   Past Medical History  Diagnosis Date  . CHF (congestive heart failure)   . Alcohol abuse   . Seizure   . Noncompliance with medication treatment due to intermittent use of medication 02/02/2012  . HTN (hypertension) 02/02/2012  . Alcohol  withdrawal seizure 02/02/2012  . Hypertension   . Dysrhythmia     paroxismal atrial fib  . Paroxysmal Atrial Flutter 02/01/2012  . Hepatitis C 02/02/2012    Unsure if he was ever diagnosed with Hep C  . Hepatitis C    AXIS IV:  problems related to legal system/crime AXIS V:  41-50 serious symptoms  Treatment Plan/Recommendations:  Supportive approach/coping skills/relapse prevention                                                                 Librium Detox/reassess and address the co morbities  Treatment Plan Summary: Daily contact with patient to assess and evaluate symptoms and progress in treatment Medication management Current Medications:  Current Facility-Administered Medications  Medication Dose Route Frequency Provider Last Rate Last Dose  . acetaminophen (TYLENOL) tablet 650 mg  650 mg Oral Q6H PRN Rachael Fee, MD      . alum & mag hydroxide-simeth (MAALOX/MYLANTA) 200-200-20 MG/5ML suspension 30 mL  30 mL Oral Q4H PRN Rachael Fee, MD      . chlordiazePOXIDE (LIBRIUM) capsule 25 mg  25 mg Oral QID PRN Rachael Fee, MD   25 mg at 08/03/13 2243  . magnesium hydroxide (MILK OF MAGNESIA) suspension 30 mL  30 mL Oral Daily PRN Rachael Fee, MD      . traZODone (DESYREL)  tablet 50 mg  50 mg Oral QHS,MR X 1 Rachael Fee, MD        Observation Level/Precautions:  15 minute checks  Laboratory:  As per the ED  Psychotherapy:  Individual/group  Medications:  Librium detox  Consultations:    Discharge Concerns:    Estimated LOS: 3 days  Other:     I certify that inpatient services furnished can reasonably be expected to improve the patient's condition.   Delana Manganello A 8/22/20141:51 PM

## 2013-08-04 NOTE — Clinical Social Work Note (Signed)
Pt is scheduled to go to [facility] on Monday 8/25.  CSW spoke to pt's court social worker, Isidoro Donning [phone]), who verified that she will pick pt up Monday morning, around 7:15 am, to transport pt directly to [facility].    Reyes Ivan, LCSWA 08/04/2013  1:11 PM

## 2013-08-04 NOTE — Tx Team (Signed)
Initial Interdisciplinary Treatment Plan  PATIENT STRENGTHS: (choose at least two) Active sense of humor Average or above average intelligence Communication skills General fund of knowledge Religious Affiliation Supportive family/friends Work skills  PATIENT STRESSORS: Legal issue Loss of housing Medication change or noncompliance Substance abuse   PROBLEM LIST: Problem List/Patient Goals Date to be addressed Date deferred Reason deferred Estimated date of resolution  Substance abuse - etoh 08/02/13     Housing 08/02/13     Legal issues 08/02/13                                           DISCHARGE CRITERIA:  Safe-care adequate arrangements made Verbal commitment to aftercare and medication compliance Withdrawal symptoms are absent or subacute and managed without 24-hour nursing intervention  PRELIMINARY DISCHARGE PLAN: Attend aftercare/continuing care group Attend 12-step recovery group  PATIENT/FAMIILY INVOLVEMENT: This treatment plan has been presented to and reviewed with the patient, Anthony Mcclure, and/or family member.  The patient and family have been given the opportunity to ask questions and make suggestions.  Merian Capron [facility] 08/04/2013, 12:28 AM

## 2013-08-04 NOTE — [facility] Group Notes (Signed)
[facility] LCSW Group Therapy  08/04/2013 3:00 PM  Type of Therapy:  Group Therapy  Participation Level:  Minimal  Participation Quality:  Resistant  Affect:  Excited  Cognitive:  Disorganized  Insight:  None  Engagement in Therapy:  None  Modes of Intervention:  Discussion, Education, Exploration, Socialization and Support  Summary of Progress/Problems: Feelings around Relapse. Group members discussed the meaning of relapse and shared personal stories of relapse, how it affected them and others, and how they perceived themselves during this time. Group members were encouraged to identify triggers, warning signs and coping skills used when facing the possibility of relapse. Social supports were discussed and explored in detail. Post Acute Withdrawal Syndrome (handout provided) was introduced and examined. Pt's were encouraged to ask questions, talk about key points associated with PAWS, and process this information in terms of relapse prevention. Anthony Mcclure was pleasant and cooperative this afternoon in group when asked to stop talking. He did not participate in group conversation but followed along as CSW reviewed PAWS information. He shows no insight and limited progress in the group setting AEB his lack of participation in group discussion. Anthony Mcclure makes it clear that he does not see a problem with his drinking and that he is only here to avoid jail time.   Smart, [name] 08/04/2013, 3:00 PM

## 2013-08-04 NOTE — [facility] Counselor (Signed)
Adult Psychosocial Assessment Update Interdisciplinary Team  [facility] admissions/discharges:  Admissions Discharges  Date: 07/20/13 Date: 07/24/13  Date: Date:  Date: Date:  Date: Date:  Date: Date:   Changes since the last Psychosocial Assessment (including adherence to outpatient mental health and/or substance abuse treatment, situational issues contributing to decompensation and/or relapse). Pt states that since he d/c a few weeks ago he relapsed and continued to drink.  Pt states that he doesn't have plans to stop drinking.  Pt is working with a court Child psychotherapist, who is encouraging pt to go to [facility], to prevent jail time.               Discharge Plan 1. Will you be returning to the same living situation after discharge?   Yes:  X Can return home after treatment No:      If no, what is your plan?           2. Would you like a referral for services when you are discharged? Yes:  X   If yes, for what services? Pt is scheduled to go to [facility] on Monday 8/25.    No:              Summary and Recommendations (to be completed by the evaluator) Patient is a 62 year old Caucasian Male with a diagnosis of Alcohol Abuse.  Patient lives in [place] alone.  Patient will benefit from crisis stabilization, medication evaluation, group therapy and psycho education in addition to case management for discharge planning.                         Signature:  Carmina Miller, 08/04/2013 8:23 AM

## 2013-08-04 NOTE — [facility] Suicide Risk Assessment (Signed)
Suicide Risk Assessment  Admission Assessment     Nursing information obtained from:  Patient Demographic factors:  Male;Low socioeconomic status;Living alone Current Mental Status:  NA Loss Factors:  Legal issues Historical Factors:  NA Risk Reduction Factors:  Religious beliefs about death;Positive social support;Employed  CLINICAL FACTORS:   Alcohol/Substance Abuse/Dependencies  COGNITIVE FEATURES THAT CONTRIBUTE TO RISK:  Closed-mindedness Polarized thinking Thought constriction (tunnel vision)    SUICIDE RISK:   Minimal: No identifiable suicidal ideation.  Patients presenting with no risk factors but with morbid ruminations; may be classified as minimal risk based on the severity of the depressive symptoms  PLAN OF CARE: Supportive approach/coping skills/relapse prevention                               Librium Detox protocol                               Reassess and address the co morbidities  I certify that inpatient services furnished can reasonably be expected to improve the patient's condition.  Dannel Rafter A 08/04/2013, 2:16 PM

## 2013-08-04 NOTE — [facility] Group Notes (Signed)
[facility] LCSW Aftercare Discharge Planning Group Note   08/04/2013 8:45 AM  Participation Quality:  Did Not Attend   Reyes Ivan, LCSWA 08/04/2013 9:13 AM

## 2013-08-04 NOTE — Progress Notes (Signed)
Pt sleeping at the start of this writer's shift (2330). Pt awoken to complete admit and reassess elevated BP after librium admin at 2245. On manual recheck pt's BP is 130/78. He denies complaints of any kind. Pt states he was D/C to home from [facility] on 8/13 with plans to report to [facility] for court ordered 30d treatment on 8/25 however pt began drinking immediately. Continues to drink all day, everyday and remains in hotel living with friend. No significant changes since last admit. (see recent documentation for health hx) Reviewed high fall risk precautions with pt which were initiated. Given support and allowed to return to sleep as he voiced no needs at this time. Denies SI/HI/AVH and is safe on Level III obs. Anthony Mcclure

## 2013-08-05 NOTE — Progress Notes (Signed)
D- Patient is seclusive to room and guarded with behavior.  Attended group and slept the entire time.  No insight into SA issues.  A- Support and encouragement offered.  Continue current POC and evaluation of treatment goals. Continue 15' checks for safety.  R- Safety maintained.

## 2013-08-05 NOTE — Progress Notes (Signed)
Adult Psychoeducational Group Note  Date:  08/05/2013 Time:  0900  Group Topic/Focus:  Self Inventory  Participation Level:  Did Not Attend  Pt decided not to attend group  Roxie Gueye Shari Prows 08/05/2013, 11:00 AM

## 2013-08-05 NOTE — [facility] Group Notes (Signed)
[facility] Group Notes:  (Nursing/MHT/Case Management/Adjunct)  Date:  08/05/2013  Time:  2:50 PM  Type of Therapy:  Nurse Education  Participation Level:  Minimal  Participation Quality:  Drowsy  Affect:  Lethargic  Cognitive:  Appropriate  Insight:  Lacking  Engagement in Group:  None  Modes of Intervention:  Problem-solving  Summary of Progress/Problems:  Anthony Mcclure 08/05/2013, 2:50 PM

## 2013-08-05 NOTE — [facility] Group Notes (Signed)
[facility] Group Notes:  (Clinical Social Work)  08/05/2013     10-11AM  Summary of Progress/Problems:   The main focus of today's process group was for the patient to identify ways in which they have in the past sabotaged their own recovery. Motivational Interviewing was utilized to ask the group members what they get out of their substance use, and what reasons they may have for wanting to change.  The Stages of Change were explained using a handout, and patients identified where they currently are with regard to stages of change.  The patient expressed that he enjoys drinking, that whenever he has a job he can do it while drinking, and he has friends while drinking.  He stated he is not going to quit drinking, that he has fun while drinking.  He is in Precontemplation Stage.  Type of Therapy:  Group Therapy - Process   Participation Level:  Active  Participation Quality:  Intrusive, Inattentive and Sharing  Affect:  Appropriate  Cognitive:  Alert  Insight:  Lacking  Engagement in Therapy:  Distracting  Modes of Intervention:  Education, Support and Processing, Motivational Interviewing  Ambrose Mantle, LCSW 08/05/2013, 11:19 AM

## 2013-08-05 NOTE — Progress Notes (Signed)
[facility] MD Progress Note  08/05/2013 4:40 PM Anthony Mcclure  MRN:  [id number] Subjective: continues to be detox. Would like to be D/C tomorrow to pick things up to go to [facility] Monday AM. The case worker/court wants him to go straight from here to [facility]. She is going to pick him up. He has expressed the desire not to quit drinking Diagnosis:   DSM5: Schizophrenia Disorders:   Obsessive-Compulsive Disorders:  Trauma-Stressor Disorders:   Substance/Addictive Disorders:  Alcohol Related Disorder - Severe (303.90) Depressive Disorders:      ADL's:  Intact  Sleep: Fair  Appetite:  Fair  Suicidal Ideation:  Plan:  denies Intent:  denies Means:  denies Homicidal Ideation:  Plan:  denies Intent:  denies Means:  denies AEB (as evidenced by):  Psychiatric Specialty Exam: Review of Systems  Constitutional: Negative.   HENT: Negative.   Eyes: Negative.   Respiratory: Negative.   Cardiovascular: Negative.   Gastrointestinal: Negative.   Genitourinary: Negative.   Musculoskeletal: Positive for back pain and joint pain.  Skin: Negative.   Neurological: Negative.   Endo/Heme/Allergies: Negative.   Psychiatric/Behavioral: Positive for substance abuse. The patient is nervous/anxious.     Blood pressure 132/85, pulse 92, temperature 98.5 F (36.9 C), temperature source Oral, resp. rate 16, height 5' 9.29" (1.76 m), weight 89.359 kg (197 lb).Body mass index is 28.85 kg/(m^2).  General Appearance: Disheveled  Eye Solicitor::  Fair  Speech:  Slurred and articualtion problems due to adentia  Volume:  fluctuates  Mood:  Anxious and worried  Affect:  expansive  Thought Process:  Coherent and Goal Directed  Orientation:  Full (Time, Place, and Person)  Thought Content:  concerns about not being able to pick up his things before he goes to [facility] on Monday  Suicidal Thoughts:  No  Homicidal Thoughts:  No  Memory:  Immediate;   Fair Recent;   Fair Remote;   Fair  Judgement:  Fair   Insight:  Shallow  Psychomotor Activity:  Restlessness  Concentration:  Fair  Recall:  Fair  Akathisia:  No  Handed:  Right  AIMS (if indicated):     Assets:  Desire for Improvement  Sleep:  Number of Hours: 6.5   Current Medications: Current Facility-Administered Medications  Medication Dose Route Frequency Provider Last Rate Last Dose  . acetaminophen (TYLENOL) tablet 650 mg  650 mg Oral Q6H PRN Rachael Fee, MD      . alum & mag hydroxide-simeth (MAALOX/MYLANTA) 200-200-20 MG/5ML suspension 30 mL  30 mL Oral Q4H PRN Rachael Fee, MD      . chlordiazePOXIDE (LIBRIUM) capsule 25 mg  25 mg Oral QID PRN Rachael Fee, MD   25 mg at 08/03/13 2243  . chlordiazePOXIDE (LIBRIUM) capsule 25 mg  25 mg Oral Q6H PRN Rachael Fee, MD   25 mg at 08/04/13 2253  . chlordiazePOXIDE (LIBRIUM) capsule 25 mg  25 mg Oral TID Rachael Fee, MD   25 mg at 08/05/13 1148   Followed by  . [START ON 08/06/2013] chlordiazePOXIDE (LIBRIUM) capsule 25 mg  25 mg Oral BH-qamhs Rachael Fee, MD       Followed by  . [START ON 08/07/2013] chlordiazePOXIDE (LIBRIUM) capsule 25 mg  25 mg Oral Daily Rachael Fee, MD      . diltiazem (DILACOR XR) 24 hr capsule 120 mg  120 mg Oral Daily Rachael Fee, MD   120 mg at 08/05/13 0865  . folic acid (FOLVITE)  tablet 1 mg  1 mg Oral Daily Rachael Fee, MD   1 mg at 08/05/13 0454  . gabapentin (NEURONTIN) capsule 300 mg  300 mg Oral TID Rachael Fee, MD   300 mg at 08/05/13 1149  . hydrOXYzine (ATARAX/VISTARIL) tablet 25 mg  25 mg Oral Q6H PRN Rachael Fee, MD      . loperamide (IMODIUM) capsule 2-4 mg  2-4 mg Oral PRN Rachael Fee, MD      . magnesium hydroxide (MILK OF MAGNESIA) suspension 30 mL  30 mL Oral Daily PRN Rachael Fee, MD      . multivitamin with minerals tablet 1 tablet  1 tablet Oral Daily Rachael Fee, MD   1 tablet at 08/05/13 [phone]  . omega-3 acid ethyl esters (LOVAZA) capsule 1 g  1 g Oral BID Rachael Fee, MD   1 g at 08/05/13 [phone]  . ondansetron  (ZOFRAN-ODT) disintegrating tablet 4 mg  4 mg Oral Q6H PRN Rachael Fee, MD      . phenytoin (DILANTIN) ER capsule 100 mg  100 mg Oral Daily Rachael Fee, MD   100 mg at 08/05/13 7829  . thiamine (VITAMIN B-1) tablet 100 mg  100 mg Oral Daily Rachael Fee, MD   100 mg at 08/05/13 5621  . traZODone (DESYREL) tablet 50 mg  50 mg Oral QHS,MR X 1 Rachael Fee, MD   50 mg at 08/04/13 2145    Lab Results:  Results for orders placed during the hospital encounter of 08/03/13 (from the past 48 hour(s))  PROTIME-INR     Status: None   Collection Time    08/04/13  6:20 AM      Result Value Range   Prothrombin Time 13.3  11.6 - 15.2 seconds   INR 1.03  0.00 - 1.49   Comment: Performed at [facility]  COMPREHENSIVE METABOLIC PANEL     Status: Abnormal   Collection Time    08/04/13  6:20 AM      Result Value Range   Sodium 137  135 - 145 mEq/L   Potassium 3.9  3.5 - 5.1 mEq/L   Chloride 99  96 - 112 mEq/L   CO2 31  19 - 32 mEq/L   Glucose, Bld 86  70 - 99 mg/dL   BUN 12  6 - 23 mg/dL   Creatinine, Ser 3.08  0.50 - 1.35 mg/dL   Calcium 9.2  8.4 - 65.7 mg/dL   Total Protein 7.3  6.0 - 8.3 g/dL   Albumin 3.4 (*) 3.5 - 5.2 g/dL   AST 846 (*) 0 - 37 U/L   ALT 143 (*) 0 - 53 U/L   Alkaline Phosphatase 71  39 - 117 U/L   Total Bilirubin 1.5 (*) 0.3 - 1.2 mg/dL   GFR calc non Af Amer >90  >90 mL/min   GFR calc Af Amer >90  >90 mL/min   Comment: (NOTE)     The eGFR has been calculated using the CKD EPI equation.     This calculation has not been validated in all clinical situations.     eGFR's persistently <90 mL/min signify possible Chronic Kidney     Disease.     Performed at [facility]  AMMONIA     Status: None   Collection Time    08/04/13  6:20 AM      Result Value Range   Ammonia 55  11 - 60 umol/L   Comment: Performed at [facility]    Physical Findings: AIMS: Facial and Oral Movements Muscles of Facial Expression: None,  normal Lips and Perioral Area: None, normal Jaw: None, normal Tongue: None, normal,Extremity Movements Upper (arms, wrists, hands, fingers): None, normal Lower (legs, knees, ankles, toes): None, normal, Trunk Movements Neck, shoulders, hips: None, normal, Overall Severity Severity of abnormal movements (highest score from questions above): None, normal Incapacitation due to abnormal movements: None, normal Patient's awareness of abnormal movements (rate only patient's report): No Awareness, Dental Status Current problems with teeth and/or dentures?: Yes Does patient usually wear dentures?: No  CIWA:  CIWA-Ar Total: 2 COWS:  COWS Total Score: 9  Treatment Plan Summary: Daily contact with patient to assess and evaluate symptoms and progress in treatment Medication management  Plan: Supportive approach/coping skills/elapse prevention           Reassess and address the co morbidities  Medical Decision Making Problem Points:  Review of psycho-social stressors (1) Data Points:  Review of medication regiment & side effects (2)  I certify that inpatient services furnished can reasonably be expected to improve the patient's condition.   Lanaiya Lantry A 08/05/2013, 4:40 PM

## 2013-08-05 NOTE — Progress Notes (Signed)
Patient did attend the evening speaker AA meeting.  

## 2013-08-05 NOTE — Progress Notes (Signed)
Patient ID: Anthony Mcclure, male   DOB: [birth year], 62 y.o.   MRN: [id number] D: Client is visible on the unit and is interactive with select peers. Affect is animated; describes mood is "fine"; depression 0/10; hopelessness 0/10; appearance is disheveled. Client is concrete, confused, but jovial during 1:1 interaction. Denies SI/HI. Judgement impaired; insight limited.  A: Continue to encourage group attendance and participation, offer support and assistance in identifying triggers and development of coping skills and encouraged client to share thoughts and feelings to staff. Maintained safety through q 15 minute safety checks by staff.    R: Client is medication compliant and verbalizes safety to staff. "I need some gin or vodka." Not invested in treatment.

## 2013-08-06 MED ORDER — ADULT MULTIVITAMIN W/MINERALS CH: 1.0000 | ORAL_TABLET | Freq: Every day | ORAL | Status: DC

## 2013-08-06 MED ORDER — FOLIC ACID 1 MG PO TABS: 1.0000 mg | ORAL_TABLET | Freq: Every day | ORAL | Status: DC

## 2013-08-06 MED ORDER — GABAPENTIN 300 MG PO CAPS: 300.0000 mg | ORAL_CAPSULE | Freq: Three times a day (TID) | ORAL | Status: DC

## 2013-08-06 MED ORDER — ASPIRIN 81 MG PO TBEC: 81.0000 mg | DELAYED_RELEASE_TABLET | Freq: Every day | ORAL | Status: DC

## 2013-08-06 MED ORDER — DILTIAZEM HCL ER 120 MG PO CP24: 120.0000 mg | ORAL_CAPSULE | Freq: Every day | ORAL | Status: DC

## 2013-08-06 MED ORDER — ASPIRIN EC 81 MG PO TBEC
81.0000 mg | DELAYED_RELEASE_TABLET | Freq: Every day | ORAL | Status: DC
  Filled 2013-08-06: qty 1

## 2013-08-06 MED ORDER — PHENYTOIN SODIUM EXTENDED 100 MG PO CAPS: 100.0000 mg | ORAL_CAPSULE | Freq: Every day | ORAL | Status: DC

## 2013-08-06 MED ORDER — OMEGA-3-ACID ETHYL ESTERS 1 G PO CAPS: 1.0000 g | ORAL_CAPSULE | Freq: Two times a day (BID) | ORAL | Status: DC

## 2013-08-06 MED ORDER — TRAZODONE HCL 50 MG PO TABS: 50.0000 mg | ORAL_TABLET | Freq: Every evening | ORAL | Status: DC | PRN

## 2013-08-06 NOTE — Progress Notes (Signed)
Psychoeducational Group Note  Date:  08/06/2013 Time:  0945 am  Group Topic/Focus:  Making Healthy Choices:   The focus of this group is to help patients identify negative/unhealthy choices they were using prior to admission and identify positive/healthier coping strategies to replace them upon discharge.  Participation Level:  Minimal  Participation Quality:  Inattentive  Affect:  Lethargic  Cognitive:  Lacking  Insight:  Lacking  Engagement in Group:  Resistant  Additional Comments:    Andrena Mews 08/06/2013, 10:29 AM

## 2013-08-06 NOTE — [facility] Suicide Risk Assessment (Signed)
Suicide Risk Assessment  Discharge Assessment     Demographic Factors:  NA  Mental Status Per Nursing Assessment::   On Admission:  NA  Current Mental Status by Physician: In full contact with reality. There are no suicidal ideas, plans or intent. No evidence of withdrawal. He is agreeable to go to [facility] in the morning.    Loss Factors: Decline in physical health  Historical Factors: NA  Risk Reduction Factors:   Positive social support  Continued Clinical Symptoms:  Alcohol/Substance Abuse/Dependencies  Cognitive Features That Contribute To Risk:  Closed-mindedness Polarized thinking Thought constriction (tunnel vision)    Suicide Risk:  Minimal: No identifiable suicidal ideation.  Patients presenting with no risk factors but with morbid ruminations; may be classified as minimal risk based on the severity of the depressive symptoms  Discharge Diagnoses:   AXIS I:  Alcohol Dependence AXIS II:  Deferred AXIS III:   Past Medical History  Diagnosis Date  . CHF (congestive heart failure)   . Alcohol abuse   . Seizure   . Noncompliance with medication treatment due to intermittent use of medication 02/02/2012  . HTN (hypertension) 02/02/2012  . Alcohol withdrawal seizure 02/02/2012  . Hypertension   . Dysrhythmia     paroxismal atrial fib  . Paroxysmal Atrial Flutter 02/01/2012  . Hepatitis C 02/02/2012    Unsure if he was ever diagnosed with Hep C  . Hepatitis C    AXIS IV:  other psychosocial or environmental problems AXIS V:  61-70 mild symptoms  Plan Of Care/Follow-up recommendations:  Activity:  as tolerated Diet:  regular [facility] in the morning Is patient on multiple antipsychotic therapies at discharge:  No   Has Patient had three or more failed trials of antipsychotic monotherapy by history:  No  Recommended Plan for Multiple Antipsychotic Therapies: N/A   Alwyn Cordner A 08/06/2013, 8:09 PM

## 2013-08-06 NOTE — Progress Notes (Signed)
Patient ID: Anthony Mcclure, male   DOB: [birth year], 62 y.o.   MRN: [id number] D: Pt is awake and active on the unit this AM. Pt denies SI/HI and A/V hallucinations. Pt rates their depression at 0 and hopelessness at 0. Pt's most recent CIWA score was 0. Pt mood is resistant/irritable and his affect is blunted. Pt writes that he plans to "drink more ETOH" after d/c. Pt has no insight and is completely uninterested in [facility] programming.   A: Encouraged pt to discuss feelings with staff and administered medication per MD orders. Writer also encouraged pt to participate in groups.  R: Pt is attending groups and tolerating medications well. Writer will continue to monitor. 15 minute checks are ongoing for safety.

## 2013-08-06 NOTE — [facility] Group Notes (Signed)
[facility] Group Notes:  (Clinical Social Work)  08/06/2013  10:00-11:00AM  Summary of Progress/Problems:   The main focus of today's process group was to   identify the patient's current support system and decide on other supports that can be put in place.  The picture on workbook was used to discuss why additional supports are needed, and a hand-out was distributed with four definitions/levels of support, then used to talk about how patients have given and received all different kinds of support.  An emphasis was placed on using counselor, doctor, therapy groups, 12-step groups, and problem-specific support groups to expand supports.  The patient identified one additional support as being "the Good Lord".  He originally cited the judge who court-ordered him into treatment as being a support, and he wants to "make the judge happy no matter where I am."  At the beginning of group he was joking once more about how he is going to drink as soon as he leaves the hospital, and CSW reminded him that while he may not want to change, there are many in the group who do see this as a problem for them, and that we needed to be respectful of that.  He did not joke through the remainder of group.  He stated that the judge was trying to keep him from keeping on with all his fighting, and he saw this as wanting to help him.  Type of Therapy:  Process Group with Motivational Interviewing  Participation Level:  Active  Participation Quality:  Attentive and Sharing  Affect:  Depressed  Cognitive:  Appropriate  Insight:  Developing/Improving  Engagement in Therapy:  Developing/Improving  Modes of Intervention:   Education, Support and Processing, Activity  Ambrose Mantle, LCSW 08/06/2013, 12:55 PM

## 2013-08-06 NOTE — Discharge Summary (Signed)
Physician Discharge Summary Note  Patient:  Anthony Mcclure is an 62 y.o., male MRN:  [id number] DOB:  1951/11/26 Patient phone:  [phone] (home)  Patient address:   [facility] [address] [place] [address],   Date of Admission:  08/03/2013 Date of Discharge: 08/07/13  Reason for Admission: Alcohol detox  Discharge Diagnoses: Active Problems:   Alcohol dependence  Review of Systems  Constitutional: Negative.   HENT: Negative.   Eyes: Negative.   Respiratory: Negative.   Cardiovascular: Negative.   Gastrointestinal: Negative.   Genitourinary: Negative.   Musculoskeletal: Negative.   Skin: Negative.   Neurological: Negative.   Endo/Heme/Allergies: Negative.   Psychiatric/Behavioral: Positive for substance abuse (Alcoholism). Negative for depression, suicidal ideas, hallucinations and memory loss. The patient is nervous/anxious (Stabilized with medication prior to discharge) and has insomnia (Stabilized with medication prior todischarge).    DSM5: Schizophrenia Disorders:  NA Obsessive-Compulsive Disorders:  NA Trauma-Stressor Disorders:  NA Substance/Addictive Disorders:  Alcohol Withdrawal (291.81) Depressive Disorders:  NA  Axis Diagnosis:   AXIS I:  Alcohol withdrawal AXIS II:  Deferred AXIS III:   Past Medical History  Diagnosis Date  . CHF (congestive heart failure)   . Alcohol abuse   . Seizure   . Noncompliance with medication treatment due to intermittent use of medication 02/02/2012  . HTN (hypertension) 02/02/2012  . Alcohol withdrawal seizure 02/02/2012  . Hypertension   . Dysrhythmia     paroxismal atrial fib  . Paroxysmal Atrial Flutter 02/01/2012  . Hepatitis C 02/02/2012    Unsure if he was ever diagnosed with Hep C  . Hepatitis C    AXIS IV:  Alcoholism AXIS V:  63  Level of Care:  RTC  Hospital Course:  Anthony Mcclure returns for detox. He states he went back to drinking after he left our unit. He was reminded that he  was court ordered to do a 30 day residential treatment program and that he has the appointment at [facility] the 25 th (monday) He came to be detox in order to bed admitted there. If he was not to comply with the court order, he is going to spend time imprisoned due to assault charges pending from years ago. He has stated that he likes to drink and that he has been drinking since he was 62 Y/O and that he is retired and he drinks with his friends.  During his present hospital stay, Anthony Mcclure was assessed/evaluated and recommended to receive Librium detoxification treatment protocol to stabilize his systems of alcohol intoxication and to combat the withdrawal symptoms as well. This decision was based on his UDS/toxicology results that showed blood alcohol level of 219 and (+) Benzodiazepine. He was also enrolled in group counseling sessions/activities where he was counseled, taught and learned coping skills that should help him after discharge to cope better and manage his problems with alcoholism to maintain a much longer sobriety. Anthony Mcclure was also enrolled/attended AA/NA meetings being offered and held on this unit.  Besides detoxification treatment protocol, Anthony Mcclure was also ordered and received (Gabapentin) Neurontin 300 mg tid for anxiety management and Trazodone 50 mg Q bedtime for sleep. He has other previously existing and or other mental issues and or concerns that required medication management/monitoring, he received medication management for all those health issues. He was monitored closely for any potential problems that may arise as a result of his treatments. Anthony Mcclure tolerated his treatment regimen without any significant adverse effects and or reactions presented.  Anthony Mcclure  is currently showing improved condition from the withdrawal symptoms of alcohol, this is evidenced by his verbal reports of improved mood, symptoms, presentation of good affect and absence of tremors, dizziness etc. It was then decided  that he is ready to start his 30 day substance abuse treatment at the [facility] in [place], [place] as early as today. Upon discharge, patient adamantly denies any suicidal, homicidal ideations, auditory, visual hallucinations, delusional thoughts, paranoia and or withdrawal symptoms. He received from [facility] a 2 weeks worth supply samples of her [facility] discharge medications and 30 days worth prescriptions. Patient left [facility] with all personal belongings in no apparent distress. Transportation per court Child psychotherapist. Consults:  psychiatry  Significant Diagnostic Studies:  labs: CBC with diff, CMP, UDS, Toxicology tests, U/A  Discharge Vitals:   Blood pressure 125/83, pulse 89, temperature 97.3 F (36.3 C), temperature source Oral, resp. rate 20, height 5' 9.29" (1.76 m), weight 89.359 kg (197 lb). Body mass index is 28.85 kg/(m^2). Lab Results:   Results for orders placed during the hospital encounter of 08/03/13 (from the past 72 hour(s))  PROTIME-INR     Status: None   Collection Time    08/04/13  6:20 AM      Result Value Range   Prothrombin Time 13.3  11.6 - 15.2 seconds   INR 1.03  0.00 - 1.49   Comment: Performed at [facility]  COMPREHENSIVE METABOLIC PANEL     Status: Abnormal   Collection Time    08/04/13  6:20 AM      Result Value Range   Sodium 137  135 - 145 mEq/L   Potassium 3.9  3.5 - 5.1 mEq/L   Chloride 99  96 - 112 mEq/L   CO2 31  19 - 32 mEq/L   Glucose, Bld 86  70 - 99 mg/dL   BUN 12  6 - 23 mg/dL   Creatinine, Ser 1.91  0.50 - 1.35 mg/dL   Calcium 9.2  8.4 - 47.8 mg/dL   Total Protein 7.3  6.0 - 8.3 g/dL   Albumin 3.4 (*) 3.5 - 5.2 g/dL   AST 295 (*) 0 - 37 U/L   ALT 143 (*) 0 - 53 U/L   Alkaline Phosphatase 71  39 - 117 U/L   Total Bilirubin 1.5 (*) 0.3 - 1.2 mg/dL   GFR calc non Af Amer >90  >90 mL/min   GFR calc Af Amer >90  >90 mL/min   Comment: (NOTE)     The eGFR has been calculated using the CKD EPI equation.     This calculation  has not been validated in all clinical situations.     eGFR's persistently <90 mL/min signify possible Chronic Kidney     Disease.     Performed at [facility]  AMMONIA     Status: None   Collection Time    08/04/13  6:20 AM      Result Value Range   Ammonia 55  11 - 60 umol/L   Comment: Performed at [facility]    Physical Findings: AIMS: Facial and Oral Movements Muscles of Facial Expression: None, normal Lips and Perioral Area: None, normal Jaw: None, normal Tongue: None, normal,Extremity Movements Upper (arms, wrists, hands, fingers): None, normal Lower (legs, knees, ankles, toes): None, normal, Trunk Movements Neck, shoulders, hips: None, normal, Overall Severity Severity of abnormal movements (highest score from questions above): None, normal Incapacitation due to abnormal movements: None, normal Patient's  awareness of abnormal movements (rate only patient's report): No Awareness, Dental Status Current problems with teeth and/or dentures?: Yes Does patient usually wear dentures?: No  CIWA:  CIWA-Ar Total: 3 COWS:  COWS Total Score: 9  Psychiatric Specialty Exam: See Psychiatric Specialty Exam and Suicide Risk Assessment completed by Attending Physician prior to discharge.  Discharge destination:  [facility]  Is patient on multiple antipsychotic therapies at discharge:  No   Has Patient had three or more failed trials of antipsychotic monotherapy by history:  No  Recommended Plan for Multiple Antipsychotic Therapies: NA      Discharge Orders   Future Orders Complete By Expires   Discharge instructions  As directed    Comments:     Take all your medications as prescribed by your mental healthcare provider. Report any adverse effects and or reactions from your medicines to your outpatient provider promptly. You are hereby instructed and cautioned to not engage in alcohol and or illegal drug use while on prescription  medicines. In the event of worsening symptoms, patient is instructed to call the crisis hotline, 911 and or go to the nearest ED for appropriate evaluation and treatment of symptoms. Follow-up with your primary care provider for your other medical issues, concerns and or health care needs.       Medication List       Indication   aspirin 81 MG EC tablet  Take 1 tablet (81 mg total) by mouth daily. For heart health   Indication:  Heart Attack, Blood Clot, Heart health     diltiazem 120 MG 24 hr capsule  Commonly known as:  DILACOR XR  Take 1 capsule (120 mg total) by mouth daily. Heart condition/high blood pressure control   Indication:  High Blood Pressure, CAD     folic acid 1 MG tablet  Commonly known as:  FOLVITE  Take 1 tablet (1 mg total) by mouth daily. For low folate   Indication:  Deficiency of Folic Acid in the Diet     gabapentin 300 MG capsule  Commonly known as:  NEURONTIN  Take 1 capsule (300 mg total) by mouth 3 (three) times daily. For anxiety   Indication:  Agitation, Pain, Anxiety     multivitamin with minerals Tabs tablet  Take 1 tablet by mouth daily. Vitamin supplement   Indication:  Vitamin Supplementation.     omega-3 acid ethyl esters 1 G capsule  Commonly known as:  LOVAZA  Take 1 capsule (1 g total) by mouth 2 (two) times daily. For high cholesterol control   Indication:  High Amount of Triglycerides in the Blood     phenytoin 100 MG ER capsule  Commonly known as:  DILANTIN  Take 1 capsule (100 mg total) by mouth daily. For seizure control   Indication:  Seizure     traZODone 50 MG tablet  Commonly known as:  DESYREL  Take 1 tablet (50 mg total) by mouth at bedtime and may repeat dose one time if needed. For sleep   Indication:  Trouble Sleeping       Follow-up Information   Follow up with [facility] On 08/07/2013. (Arrive at 8:00 am for assessment and possible admisison for treatment.  )    Contact information:   [address] [place], [place] [address] Phone: [phone] Fax: [phone]     Follow-up recommendations: Activity:  As tolerated Diet: As recommended by your primary care doctor. Keep all scheduled follow-up appointments as recommended.  Continue  to work your relapse prevention plan Comments:  Take all your medications as prescribed by your mental healthcare provider. Report any adverse effects and or reactions from your medicines to your outpatient provider promptly. Patient is instructed and cautioned to not engage in alcohol and or illegal drug use while on prescription medicines. In the event of worsening symptoms, patient is instructed to call the crisis hotline, 911 and or go to the nearest ED for appropriate evaluation and treatment of symptoms. Follow-up with your primary care provider for your other medical issues, concerns and or health care needs.   Total Discharge Time:  Greater than 30 minutes.  Signed: Sanjuana Kava, PMHNP-Bc 08/06/2013, 6:37 PM Agree with assessment  And plan Madie Reno A. [place], [email or web address].D

## 2013-08-06 NOTE — Progress Notes (Signed)
[facility] MD Progress Note  08/06/2013 7:47 PM Anthony Mcclure  MRN:  [id number] Subjective:  Anthony Mcclure states he is going to the rehab but he is not really committed to abstain afterwards. He is going to be picked up by his case manager and taken to [facility] in the morning. He is worried as he wants to go by his apartment to pick up clothes for the 30 days. He is going to ask the case worker to take him over.  Diagnosis:   DSM5: Schizophrenia Disorders:   Obsessive-Compulsive Disorders:  Trauma-Stressor Disorders:   Substance/Addictive Disorders:  Alcohol Related Disorder - Severe (303.90) Depressive Disorders:      ADL's:  Intact  Sleep: Fair  Appetite:  Fair  Suicidal Ideation:  Plan:  denies Intent:  denies Means:  denies Homicidal Ideation:  Plan:  denies Intent:  denies Means:  denies AEB (as evidenced by):  Psychiatric Specialty Exam: Review of Systems  Constitutional: Negative.   HENT: Negative.   Eyes: Negative.   Respiratory: Negative.   Cardiovascular: Negative.   Gastrointestinal: Negative.   Genitourinary: Negative.   Musculoskeletal: Positive for back pain.  Skin: Negative.   Neurological: Negative.   Endo/Heme/Allergies: Negative.   Psychiatric/Behavioral: Positive for substance abuse. The patient is nervous/anxious.     Blood pressure 125/83, pulse 89, temperature 97.3 F (36.3 C), temperature source Oral, resp. rate 20, height 5' 9.29" (1.76 m), weight 89.359 kg (197 lb).Body mass index is 28.85 kg/(m^2).  General Appearance: Fairly Groomed  Patent attorney::  Fair  Speech:  Slurred  Volume:  fluctuates  Mood:  worried  Affect:  expansive  Thought Process:  Coherent and Goal Directed  Orientation:  Full (Time, Place, and Person)  Thought Content:  concerns about being able to get his clothes  Suicidal Thoughts:  No  Homicidal Thoughts:  No  Memory:  Immediate;   Fair Recent;   Fair Remote;   Fair  Judgement:  Fair  Insight:  Present  Psychomotor  Activity:  Restlessness  Concentration:  Fair  Recall:  Fair  Akathisia:  No  Handed:  Right  AIMS (if indicated):     Assets:  Desire for Improvement  Sleep:  Number of Hours: 6   Current Medications: Current Facility-Administered Medications  Medication Dose Route Frequency Provider Last Rate Last Dose  . acetaminophen (TYLENOL) tablet 650 mg  650 mg Oral Q6H PRN Rachael Fee, MD      . alum & mag hydroxide-simeth (MAALOX/MYLANTA) 200-200-20 MG/5ML suspension 30 mL  30 mL Oral Q4H PRN Rachael Fee, MD      . chlordiazePOXIDE (LIBRIUM) capsule 25 mg  25 mg Oral QID PRN Rachael Fee, MD   25 mg at 08/03/13 2243  . chlordiazePOXIDE (LIBRIUM) capsule 25 mg  25 mg Oral Q6H PRN Rachael Fee, MD   25 mg at 08/04/13 2253  . chlordiazePOXIDE (LIBRIUM) capsule 25 mg  25 mg Oral BH-qamhs Rachael Fee, MD   25 mg at 08/06/13 4034   Followed by  . [START ON 08/07/2013] chlordiazePOXIDE (LIBRIUM) capsule 25 mg  25 mg Oral Daily Rachael Fee, MD      . diltiazem (DILACOR XR) 24 hr capsule 120 mg  120 mg Oral Daily Rachael Fee, MD   120 mg at 08/06/13 7425  . folic acid (FOLVITE) tablet 1 mg  1 mg Oral Daily Rachael Fee, MD   1 mg at 08/06/13 9563  . gabapentin (NEURONTIN) capsule 300 mg  300 mg Oral TID Rachael Fee, MD   300 mg at 08/06/13 1815  . hydrOXYzine (ATARAX/VISTARIL) tablet 25 mg  25 mg Oral Q6H PRN Rachael Fee, MD      . loperamide (IMODIUM) capsule 2-4 mg  2-4 mg Oral PRN Rachael Fee, MD      . magnesium hydroxide (MILK OF MAGNESIA) suspension 30 mL  30 mL Oral Daily PRN Rachael Fee, MD      . multivitamin with minerals tablet 1 tablet  1 tablet Oral Daily Rachael Fee, MD   1 tablet at 08/06/13 [phone]  . omega-3 acid ethyl esters (LOVAZA) capsule 1 g  1 g Oral BID Rachael Fee, MD   1 g at 08/06/13 1815  . ondansetron (ZOFRAN-ODT) disintegrating tablet 4 mg  4 mg Oral Q6H PRN Rachael Fee, MD      . phenytoin (DILANTIN) ER capsule 100 mg  100 mg Oral Daily Rachael Fee,  MD   100 mg at 08/06/13 1478  . thiamine (VITAMIN B-1) tablet 100 mg  100 mg Oral Daily Rachael Fee, MD   100 mg at 08/06/13 2956  . traZODone (DESYREL) tablet 50 mg  50 mg Oral QHS,MR X 1 Rachael Fee, MD   50 mg at 08/05/13 2158    Lab Results: No results found for this or any previous visit (from the past 48 hour(s)).  Physical Findings: AIMS: Facial and Oral Movements Muscles of Facial Expression: None, normal Lips and Perioral Area: None, normal Jaw: None, normal Tongue: None, normal,Extremity Movements Upper (arms, wrists, hands, fingers): None, normal Lower (legs, knees, ankles, toes): None, normal, Trunk Movements Neck, shoulders, hips: None, normal, Overall Severity Severity of abnormal movements (highest score from questions above): None, normal Incapacitation due to abnormal movements: None, normal Patient's awareness of abnormal movements (rate only patient's report): No Awareness, Dental Status Current problems with teeth and/or dentures?: Yes Does patient usually wear dentures?: No  CIWA:  CIWA-Ar Total: 3 COWS:  COWS Total Score: 9  Treatment Plan Summary: Daily contact with patient to assess and evaluate symptoms and progress in treatment Medication management  Plan: Supportive approach/coping skills/relapse prevention           To be D/C to [facility] in the AM  Medical Decision Making Problem Points:  Review of psycho-social stressors (1) Data Points:  Review of medication regiment & side effects (2)  I certify that inpatient services furnished can reasonably be expected to improve the patient's condition.   Donaldo Teegarden A 08/06/2013, 7:47 PM

## 2013-08-06 NOTE — Progress Notes (Signed)
Patient did attend the evening speaker AA meeting.  

## 2013-08-06 NOTE — Progress Notes (Signed)
Adult Psychoeducational Group Note  Date:  08/06/2013 Time:  0900  Group Topic/Focus:  Self Inventory  Participation Level:  Minimal  Participation Quality:  Resistant and Sharing drowsy  Affect:  Defensive  Cognitive:  Appropriate  Insight: None  Engagement in Group:  Distracting  Modes of Intervention:  Discussion  Additional Comments:  Pt expressed his desire to continue drinking after d/c, and stated that he "would be dead" without ETOH. Pt is distracting but redirectable.   Awa Bachicha Shari Prows 08/06/2013, 10:32 AM

## 2013-08-06 NOTE — Progress Notes (Signed)
Patient ID: Anthony Mcclure, male   DOB: Jul 11, 1951, 62 y.o.   MRN: [id number] D: Client resting in bed with eyes closed; unlabored breathing noted.   A: Q 15 minute safety checks completed.   R: Safety maintained.

## 2013-08-07 DIAGNOSIS — F10239 Alcohol dependence with withdrawal, unspecified: Secondary | ICD-10-CM

## 2013-08-07 NOTE — Progress Notes (Signed)
[facility] Adult Case Management Discharge Plan :  Will you be returning to the same living situation after discharge: Yes,  canr eturn home after treatment At discharge, do you have transportation home?:Yes,  court social worker picked pt up today Do you have the ability to pay for your medications:Yes,  access to meds  Release of information consent forms completed and in the chart;  Patient's signature needed at discharge.  Patient to Follow up at: Follow-up Information   Follow up with [facility] On 08/07/2013. (Arrive at 8:00 am for assessment and possible admisison for treatment.  )    Contact information:   [address] [place], [place] [address] Phone: [phone] Fax: [phone]      Patient denies SI/HI:   Yes,  denies SI/HI    Safety Planning and Suicide Prevention discussed:  Yes,  discussed with pt, N/A to contact family/friends to provide.  See suicide prevention education note.   Carmina Miller 08/07/2013, 8:16 AM

## 2013-08-07 NOTE — Progress Notes (Signed)
D: Client resting in bed with eyes closed; unlabored breathing noted.   A: Q 15 minute safety checks completed.   R: Safety maintained.  

## 2013-08-07 NOTE — Progress Notes (Signed)
Client discharged to [facility]ning who is taking [facility]. Denies SI. All belongings returned.

## 2013-08-10 NOTE — Consult Note (Signed)
Reviewed and agreed with 

## 2013-08-11 NOTE — Progress Notes (Signed)
Patient Discharge Instructions:  After Visit Summary (AVS):   Faxed to:  08/11/13 Discharge Summary Note:   Faxed to:  08/11/13 Psychiatric Admission Assessment Note:   Faxed to:  08/11/13 Suicide Risk Assessment - Discharge Assessment:   Faxed to:  08/11/13 Faxed/Sent to the Next Level Care provider:  08/11/13 Faxed to [facility] @ [phone]  Jerelene Redden, 08/11/2013, 2:47 PM

## 2014-01-11 ENCOUNTER — Encounter (HOSPITAL_COMMUNITY): Payer: Self-pay | Admitting: Emergency Medicine

## 2014-01-11 ENCOUNTER — Emergency Department (HOSPITAL_COMMUNITY)
Admission: EM | Admit: 2014-01-11 | Discharge: 2014-01-11 | Disposition: A | Discharge status: Home / Self Care | Payer: Medicare Other | Attending: Emergency Medicine | Admitting: Emergency Medicine

## 2014-01-11 ENCOUNTER — Emergency Department (HOSPITAL_COMMUNITY): Payer: Medicare Other

## 2014-01-11 DIAGNOSIS — Z91199 Patient's noncompliance with other medical treatment and regimen due to unspecified reason: Secondary | ICD-10-CM | POA: Insufficient documentation

## 2014-01-11 DIAGNOSIS — M79673 Pain in unspecified foot: Secondary | ICD-10-CM

## 2014-01-11 DIAGNOSIS — S99919A Unspecified injury of unspecified ankle, initial encounter: Principal | ICD-10-CM

## 2014-01-11 DIAGNOSIS — I4891 Unspecified atrial fibrillation: Secondary | ICD-10-CM | POA: Insufficient documentation

## 2014-01-11 DIAGNOSIS — T148XXA Other injury of unspecified body region, initial encounter: Secondary | ICD-10-CM

## 2014-01-11 DIAGNOSIS — Z7982 Long term (current) use of aspirin: Secondary | ICD-10-CM | POA: Insufficient documentation

## 2014-01-11 DIAGNOSIS — I509 Heart failure, unspecified: Secondary | ICD-10-CM | POA: Insufficient documentation

## 2014-01-11 DIAGNOSIS — Z79899 Other long term (current) drug therapy: Secondary | ICD-10-CM | POA: Insufficient documentation

## 2014-01-11 DIAGNOSIS — Y92410 Unspecified street and highway as the place of occurrence of the external cause: Secondary | ICD-10-CM | POA: Insufficient documentation

## 2014-01-11 DIAGNOSIS — S99929A Unspecified injury of unspecified foot, initial encounter: Principal | ICD-10-CM

## 2014-01-11 DIAGNOSIS — IMO0002 Reserved for concepts with insufficient information to code with codable children: Secondary | ICD-10-CM | POA: Insufficient documentation

## 2014-01-11 DIAGNOSIS — Y9301 Activity, walking, marching and hiking: Secondary | ICD-10-CM | POA: Insufficient documentation

## 2014-01-11 DIAGNOSIS — Z9119 Patient's noncompliance with other medical treatment and regimen: Secondary | ICD-10-CM | POA: Insufficient documentation

## 2014-01-11 DIAGNOSIS — I10 Essential (primary) hypertension: Secondary | ICD-10-CM | POA: Insufficient documentation

## 2014-01-11 DIAGNOSIS — G40909 Epilepsy, unspecified, not intractable, without status epilepticus: Secondary | ICD-10-CM | POA: Insufficient documentation

## 2014-01-11 DIAGNOSIS — S8990XA Unspecified injury of unspecified lower leg, initial encounter: Secondary | ICD-10-CM | POA: Insufficient documentation

## 2014-01-11 DIAGNOSIS — Z23 Encounter for immunization: Secondary | ICD-10-CM | POA: Insufficient documentation

## 2014-01-11 DIAGNOSIS — Z8619 Personal history of other infectious and parasitic diseases: Secondary | ICD-10-CM | POA: Insufficient documentation

## 2014-01-11 MED ORDER — CLONIDINE HCL 0.1 MG PO TABS
0.1000 mg | ORAL_TABLET | Freq: Once | ORAL | Status: AC
  Administered 2014-01-11: 0.1 mg via ORAL
  Filled 2014-01-11: qty 1

## 2014-01-11 MED ORDER — TETANUS-DIPHTH-ACELL PERTUSSIS 5-2.5-18.5 LF-MCG/0.5 IM SUSP
0.5000 mL | Freq: Once | INTRAMUSCULAR | Status: AC
  Administered 2014-01-11: 0.5 mL via INTRAMUSCULAR
  Filled 2014-01-11: qty 0.5

## 2014-01-11 NOTE — ED Notes (Signed)
Bed: JGG8 Expected date:  Expected time:  Means of arrival:  Comments: EMS- foot pain

## 2014-01-11 NOTE — Discharge Instructions (Signed)
Your x-rays do not show any broken bones. Please followup with the primary care provider for continued evaluation and treatment.   Contusion A contusion is a deep bruise. Contusions are the result of an injury that caused bleeding under the skin. The contusion may turn blue, purple, or yellow. Minor injuries will give you a painless contusion, but more severe contusions may stay painful and swollen for a few weeks.  CAUSES  A contusion is usually caused by a blow, trauma, or direct force to an area of the body. SYMPTOMS   Swelling and redness of the injured area.  Bruising of the injured area.  Tenderness and soreness of the injured area.  Pain. DIAGNOSIS  The diagnosis can be made by taking a history and physical exam. An X-ray, CT scan, or MRI may be needed to determine if there were any associated injuries, such as fractures. TREATMENT  Specific treatment will depend on what area of the body was injured. In general, the best treatment for a contusion is resting, icing, elevating, and applying cold compresses to the injured area. Over-the-counter medicines may also be recommended for pain control. Ask your caregiver what the best treatment is for your contusion. HOME CARE INSTRUCTIONS   Put ice on the injured area.  Put ice in a plastic bag.  Place a towel between your skin and the bag.  Leave the ice on for 15-20 minutes, 03-04 times a day.  Only take over-the-counter or prescription medicines for pain, discomfort, or fever as directed by your caregiver. Your caregiver may recommend avoiding anti-inflammatory medicines (aspirin, ibuprofen, and naproxen) for 48 hours because these medicines may increase bruising.  Rest the injured area.  If possible, elevate the injured area to reduce swelling. SEEK IMMEDIATE MEDICAL CARE IF:   You have increased bruising or swelling.  You have pain that is getting worse.  Your swelling or pain is not relieved with medicines. MAKE SURE  YOU:   Understand these instructions.  Will watch your condition.  Will get help right away if you are not doing well or get worse. Document Released: 09/09/2005 Document Revised: 02/22/2012 Document Reviewed: 10/05/2011 [facility] Patient Information 2014 [place], [place].    Hypertension As your heart beats, it forces blood through your arteries. This force is your blood pressure. If the pressure is too high, it is called hypertension (HTN) or high blood pressure. HTN is dangerous because you may have it and not know it. High blood pressure may mean that your heart has to work harder to pump blood. Your arteries may be narrow or stiff. The extra work puts you at risk for heart disease, stroke, and other problems.  Blood pressure consists of two numbers, a higher number over a lower, 110/72, for example. It is stated as "110 over 72." The ideal is below 120 for the top number (systolic) and under 80 for the bottom (diastolic). Write down your blood pressure today. You should pay close attention to your blood pressure if you have certain conditions such as:  Heart failure.  Prior heart attack.  Diabetes  Chronic kidney disease.  Prior stroke.  Multiple risk factors for heart disease. To see if you have HTN, your blood pressure should be measured while you are seated with your arm held at the level of the heart. It should be measured at least twice. A one-time elevated blood pressure reading (especially in the Emergency Department) does not mean that you need treatment. There may be conditions in which the blood  pressure is different between your right and left arms. It is important to see your caregiver soon for a recheck. Most people have essential hypertension which means that there is not a specific cause. This type of high blood pressure may be lowered by changing lifestyle factors such as:  Stress.  Smoking.  Lack of exercise.  Excessive weight.  Drug/tobacco/alcohol  use.  Eating less salt. Most people do not have symptoms from high blood pressure until it has caused damage to the body. Effective treatment can often prevent, delay or reduce that damage. TREATMENT  When a cause has been identified, treatment for high blood pressure is directed at the cause. There are a large number of medications to treat HTN. These fall into several categories, and your caregiver will help you select the medicines that are best for you. Medications may have side effects. You should review side effects with your caregiver. If your blood pressure stays high after you have made lifestyle changes or started on medicines,   Your medication(s) may need to be changed.  Other problems may need to be addressed.  Be certain you understand your prescriptions, and know how and when to take your medicine.  Be sure to follow up with your caregiver within the time frame advised (usually within two weeks) to have your blood pressure rechecked and to review your medications.  If you are taking more than one medicine to lower your blood pressure, make sure you know how and at what times they should be taken. Taking two medicines at the same time can result in blood pressure that is too low. SEEK IMMEDIATE MEDICAL CARE IF:  You develop a severe headache, blurred or changing vision, or confusion.  You have unusual weakness or numbness, or a faint feeling.  You have severe chest or abdominal pain, vomiting, or breathing problems. MAKE SURE YOU:   Understand these instructions.  Will watch your condition.  Will get help right away if you are not doing well or get worse. Document Released: 11/30/2005 Document Revised: 02/22/2012 Document Reviewed: 07/20/2008 [facility] Patient Information 2014 [place].    Emergency Department Resource Guide 1) Find a Doctor and Pay Out of Pocket Although you won't have to find out who is covered by your insurance plan, it is a good idea to  ask around and get recommendations. You will then need to call the office and see if the doctor you have chosen will accept you as a new patient and what types of options they offer for patients who are self-pay. Some doctors offer discounts or will set up payment plans for their patients who do not have insurance, but you will need to ask so you aren't surprised when you get to your appointment.  2) Contact Your Local Health Department Not all health departments have doctors that can see patients for sick visits, but many do, so it is worth a call to see if yours does. If you don't know where your local health department is, you can check in your phone book. The CDC also has a tool to help you locate your state's health department, and many state websites also have listings of all of their local health departments.  3) Find a [facility] If your illness is not likely to be very severe or complicated, you may want to try a walk in clinic. These are popping up all over the country in pharmacies, drugstores, and shopping centers. They're usually staffed by nurse practitioners or physician assistants  that have been trained to treat common illnesses and complaints. They're usually fairly quick and inexpensive. However, if you have serious medical issues or chronic medical problems, these are probably not your best option.  No Primary Care Doctor: - Call Health Connect at  [phone] - they can help you locate a primary care doctor that  accepts your insurance, provides certain services, etc. - Physician Referral Service- [phone]  Chronic Pain Problems: Organization         Address  Phone   Notes  [facility]  [phone] Patients need to be referred by their primary care doctor.   Medication Assistance: Organization         Address  Phone   Notes  [facility] Medication [facility] [address] [place], [place] [address] [phone] --Must be a  resident of [facility] -- Must have NO insurance coverage whatsoever (no Medicaid/ Medicare, etc.) -- The pt. MUST have a primary care doctor that directs their care regularly and follows them in the community   MedAssist  [phone]   [employer]  [phone]    Agencies that provide inexpensive medical care: Organization         Address  Phone   Notes  [place]  [phone]   [facility]    [phone]   [facility] [place], [place] [address] [phone]   [place] [address], [place] [phone]   Planned Parenthood    [phone]   [facility]    [phone]   [place] and [place] [address], [place] Phone:  [phone], Fax:  [phone] Hours of Operation:  9 am - 6 pm, M-F.  Also accepts Medicaid/Medicare and self-pay.  [facility] for [place] [address], [address], [place] Phone: [phone], Fax: [phone]. Hours of Operation:  8:30 am - 5:30 pm, M-F.  Also accepts Medicaid and self-pay.  [facility] [place] [address] [place] Phone: [phone]   [place], [place], [place] [phone], Ext. [phone] Mondays & Thursdays: 7-9 AM.  First 15 patients are seen on a first come, first serve basis.    [place] Providers:  Organization         Address  Phone   Notes  [facility] [address], [place] [phone] Also accepts self-pay patients.  [facility] [id number] [place], [place]  [phone]   [place], [address], [place] [phone]   [facility] [address] [place] [phone]   Lucianne Lei [address], [place]   [phone] Only accepts  [place] Access [place] patients after they have their name applied to their card.   Self-Pay (no insurance) in [facility]:  Organization         Address  Phone   Notes  Sickle Cell Patients, [facility] [place] [phone]   [facility] [place] [phone]   [facility] [place], [address], [place] [phone]   [facility]/Dr. Osei-Bonsu  [address] [place] [address],  [facility] or [place], [address], [place] [phone] Phone number for both [place] and [place] locations is the same.  [facility] [address] [place] [phone]   [facility] [address] [place] or [address] [phone]   [facility] [address] [place] [phone], phone; [phone], fax Sees patients 1st and 3rd Saturday of every month.  Must not qualify for public or private insurance (i.e. Medicaid, Medicare, [place] Health Choice, Veterans' Benefits)  Household income should be no more than 200% of the poverty level The clinic cannot treat you if you are pregnant or think you are pregnant  Sexually transmitted diseases are not treated at the clinic.    Dental Care: Organization         Address  Phone  Notes  [facility] Department of [facility] [place] [phone] Accepts children up to age 48 who are enrolled in [place] or [place]; pregnant women with a Medicaid card; and children who have applied for Medicaid or [place] Health Choice, but were declined, whose parents can pay a reduced fee at time of service.  [facility] Department of [facility]  [address], [place] [phone] Accepts children up to age 71 who are enrolled in [place] or [place]; pregnant women  with a Medicaid card; and children who have applied for Medicaid or [place] Health Choice, but were declined, whose parents can pay a reduced fee at time of service.  [place] Adult Dental Access PROGRAM  [place] [phone] Patients are seen by appointment only. Walk-ins are not accepted. [place] will see patients 69 years of age and older. Monday - Tuesday (8am-5pm) Most Wednesdays (8:30-5pm) $30 per visit, cash only  [facility] Adult Dental Access PROGRAM  [address], [facility] [phone] Patients are seen by appointment only. Walk-ins are not accepted. [place] will see patients 76 years of age and older. One Wednesday Evening (Monthly: Volunteer Based).  $30 per visit, cash only  [place]  [phone] for adults; Children under age 68, call Graduate Pediatric Dentistry at [phone]. Children aged 38-14, please call [phone] to request a pediatric application.  Dental services are provided in all areas of dental care including fillings, crowns and bridges, complete and partial dentures, implants, gum treatment, root canals, and extractions. Preventive care is also provided. Treatment is provided to both adults and children. Patients are selected via a lottery and there is often a waiting list.   [facility] [address] [place]  [phone] [email or web address]   [facility] [address] [place], [place] [phone], Ext. [phone] Second and Fourth Thursday of each month, opens at 6:30 AM; Clinic ends at 9 AM.  Patients are seen on a first-come first-served basis, and a limited number are seen during each clinic.   [facility]  [address] Hillard Danker [place], [place] [phone]   Eligibility Requirements You must have lived in [place], [place], or [place] counties for at least the last three months.   You cannot be eligible for state or federal sponsored [employer], including [employer], [place], or [employer].   You generally cannot be eligible for healthcare insurance through your employer.    How to apply:  Eligibility screenings are held every Tuesday and Wednesday afternoon from 1:00 pm until 4:00 pm. You do not need an appointment for the interview!  [facility] [address], [place], [place]   [place]  [place] Department  [place]  [phone]    Behavioral Health Resources in the Community: Intensive Outpatient Programs Organization         Address  Phone  Notes  [place] [address], [place], [place] [phone]   [facility] Outpatient [address], [place], [place]   ADS: [facility] [address] [place], [place]   [place] [address]  [place], [place] or [phone]   Substance Abuse Resources Organization         Address  Phone  Notes  Alcohol and Drug Services  [phone]   [place]  [phone]   [facility] [place]   Chinita Pester  [phone]   Residential & Outpatient Substance Abuse Program  [phone]   Psychological Services Organization         Address  Phone  Notes  [facility] [place]  [place]  [phone]   [place] [address], [place] or [phone]    Mobile Crisis Teams Organization         Address  Phone  Notes  [facility], Mobile Crisis Care Unit  [phone]   [facility]  [address] [place], [place]   [place] [address], [place] [phone]    Self-Help/Support Groups Organization         Address  Phone             Notes  [place]. of [place] - variety of support groups  [place] Call for more information  Narcotics Anonymous (NA), [facility] [address], [employer] [place]  2 meetings at this location   Special educational needs teacher         Address  Phone  Notes  [facility] Miguel Barrera,    [place]  [phone]   [facility]  [address] [place] [id number], [place], [place]   [place], [place] [phone] Admissions: 8am-3pm M-F  Incentives Substance [place] [address],    [place], [place] [phone]   [facility] [address] [place], [place], [place]   [facility] [address],  [place], [place]   [facility] - Intensive Outpatient [address], [place], [place]   [facility] ([place].,  [place], [place] [phone] or [phone]   [facility] Services (RTS) [address] [place], [place] Accepts Medicaid  [facility] [place] [address],  [place] [phone] Substance Abuse/Addiction Treatment   [facility] Organization         Address  Phone  Notes  [facility]  [phone]   Domenic Schwab, PhD [address] Arlis Porta [place], [place]   [phone] or [phone]   [place], [place] [phone]   [address], [place], [place] [phone]  [phone] Insurance/Medicaid/sponsorship through [facility] and Families [address], Ste [place], [place] [phone] [place], [place] [phone]    Dr. Adele Schilder  [phone]   [facility] Dept. 1) [address]  [place] 2) [place] 3)  [place] [address], [place] [phone]  [phone]   [place] [phone] or [phone] (After Hours)
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
## 2014-01-14 NOTE — ED Provider Notes (Signed)
Medical screening examination/treatment/procedure(s) were performed by non-physician practitioner and as supervising physician I was immediately available for consultation/collaboration.  EKG Interpretation   None      '  Megan E Docherty, MD 01/14/14 1458 

## 2014-01-18 ENCOUNTER — Emergency Department (HOSPITAL_COMMUNITY)
Admission: EM | Admit: 2014-01-18 | Discharge: 2014-01-19 | Disposition: A | Discharge status: Home / Self Care | Payer: Medicare Other | Attending: Emergency Medicine | Admitting: Emergency Medicine

## 2014-01-18 ENCOUNTER — Encounter (HOSPITAL_COMMUNITY): Payer: Self-pay | Admitting: Emergency Medicine

## 2014-01-18 DIAGNOSIS — Z91199 Patient's noncompliance with other medical treatment and regimen due to unspecified reason: Secondary | ICD-10-CM | POA: Insufficient documentation

## 2014-01-18 DIAGNOSIS — R55 Syncope and collapse: Secondary | ICD-10-CM | POA: Insufficient documentation

## 2014-01-18 DIAGNOSIS — Z7982 Long term (current) use of aspirin: Secondary | ICD-10-CM | POA: Insufficient documentation

## 2014-01-18 DIAGNOSIS — Z9119 Patient's noncompliance with other medical treatment and regimen: Secondary | ICD-10-CM | POA: Insufficient documentation

## 2014-01-18 DIAGNOSIS — F1020 Alcohol dependence, uncomplicated: Secondary | ICD-10-CM

## 2014-01-18 DIAGNOSIS — I4892 Unspecified atrial flutter: Secondary | ICD-10-CM | POA: Insufficient documentation

## 2014-01-18 DIAGNOSIS — I10 Essential (primary) hypertension: Secondary | ICD-10-CM | POA: Insufficient documentation

## 2014-01-18 DIAGNOSIS — G40909 Epilepsy, unspecified, not intractable, without status epilepticus: Secondary | ICD-10-CM | POA: Insufficient documentation

## 2014-01-18 DIAGNOSIS — F10239 Alcohol dependence with withdrawal, unspecified: Secondary | ICD-10-CM | POA: Insufficient documentation

## 2014-01-18 DIAGNOSIS — N390 Urinary tract infection, site not specified: Secondary | ICD-10-CM | POA: Insufficient documentation

## 2014-01-18 DIAGNOSIS — Z8619 Personal history of other infectious and parasitic diseases: Secondary | ICD-10-CM | POA: Insufficient documentation

## 2014-01-18 DIAGNOSIS — F10939 Alcohol use, unspecified with withdrawal, unspecified: Secondary | ICD-10-CM | POA: Insufficient documentation

## 2014-01-18 DIAGNOSIS — I4891 Unspecified atrial fibrillation: Secondary | ICD-10-CM | POA: Insufficient documentation

## 2014-01-18 DIAGNOSIS — I509 Heart failure, unspecified: Secondary | ICD-10-CM | POA: Insufficient documentation

## 2014-01-18 DIAGNOSIS — Z79899 Other long term (current) drug therapy: Secondary | ICD-10-CM | POA: Insufficient documentation

## 2014-01-18 MED ORDER — THIAMINE HCL 100 MG/ML IJ SOLN
100.0000 mg | Freq: Once | INTRAMUSCULAR | Status: AC
  Administered 2014-01-19: 100 mg via INTRAMUSCULAR
  Filled 2014-01-18: qty 2
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
## 2014-01-19 LAB — COMPREHENSIVE METABOLIC PANEL
ALT: 90 U/L — AB (ref 0–53)
AST: 152 U/L — ABNORMAL HIGH (ref 0–37)
Albumin: 3.4 g/dL — ABNORMAL LOW (ref 3.5–5.2)
Alkaline Phosphatase: 72 U/L (ref 39–117)
BILIRUBIN TOTAL: 1.6 mg/dL — AB (ref 0.3–1.2)
BUN: 13 mg/dL (ref 6–23)
CO2: 28 mEq/L (ref 19–32)
Calcium: 9.1 mg/dL (ref 8.4–10.5)
Chloride: 96 mEq/L (ref 96–112)
Creatinine, Ser: 0.9 mg/dL (ref 0.50–1.35)
GFR calc Af Amer: >90 mL/min (ref >90)
GFR calc non Af Amer: 89 mL/min — ABNORMAL LOW (ref >90)
GLUCOSE: 98 mg/dL (ref 70–99)
POTASSIUM: 4.4 meq/L (ref 3.7–5.3)
Sodium: 137 mEq/L (ref 137–147)
Total Protein: 7.6 g/dL (ref 6.0–8.3)

## 2014-01-19 LAB — CBC
HEMATOCRIT: 42.4 % (ref 39.0–52.0)
HEMOGLOBIN: 14.6 g/dL (ref 13.0–17.0)
MCH: 27.8 pg (ref 26.0–34.0)
MCHC: 34.4 g/dL (ref 30.0–36.0)
MCV: 80.8 fL (ref 78.0–100.0)
Platelets: 108 10*3/uL — ABNORMAL LOW (ref 150–400)
RBC: 5.25 MIL/uL (ref 4.22–5.81)
RDW: 17.7 % — ABNORMAL HIGH (ref 11.5–15.5)
WBC: 7.1 10*3/uL (ref 4.0–10.5)

## 2014-01-19 LAB — URINALYSIS, ROUTINE W REFLEX MICROSCOPIC
GLUCOSE, UA: NEGATIVE mg/dL
Ketones, ur: 15 mg/dL — AB
Nitrite: POSITIVE — AB
Protein, ur: 30 mg/dL — AB
SPECIFIC GRAVITY, URINE: 1.021 (ref 1.005–1.030)
UROBILINOGEN UA: 2 mg/dL — AB (ref 0.0–1.0)
pH: 6 (ref 5.0–8.0)

## 2014-01-19 LAB — POCT I-STAT TROPONIN I
Troponin i, poc: 0.05 ng/mL (ref 0.00–0.08)
Troponin i, poc: 0.06 ng/mL (ref 0.00–0.08)

## 2014-01-19 LAB — URINE MICROSCOPIC-ADD ON

## 2014-01-19 LAB — CK: Total CK: 1064 U/L — ABNORMAL HIGH (ref 7–232)

## 2014-01-19 LAB — GLUCOSE, CAPILLARY: GLUCOSE-CAPILLARY: 82 mg/dL (ref 70–99)

## 2014-01-19 LAB — MAGNESIUM: MAGNESIUM: 1.5 mg/dL (ref 1.5–2.5)

## 2014-01-19 MED ORDER — CEPHALEXIN 500 MG PO CAPS: 500.0000 mg | ORAL_CAPSULE | Freq: Four times a day (QID) | ORAL | Status: DC

## 2014-01-19 MED ORDER — SODIUM CHLORIDE 0.9 % IV BOLUS (SEPSIS)
1000.0000 mL | Freq: Once | INTRAVENOUS | Status: AC
  Administered 2014-01-19: 1000 mL via INTRAVENOUS

## 2014-01-19 MED ORDER — CEPHALEXIN 250 MG PO CAPS
1000.0000 mg | ORAL_CAPSULE | Freq: Once | ORAL | Status: AC
  Administered 2014-01-19: 1000 mg via ORAL
  Filled 2014-01-19: qty 4

## 2014-01-19 NOTE — ED Notes (Signed)
The patient had no complaints while performing orthostatic vitals. The tech has reported to the RN in charge.

## 2014-01-19 NOTE — Discharge Instructions (Signed)
Alcohol Problems °Most adults who drink alcohol drink in moderation (not a lot) are at low risk for developing problems related to their drinking. However, all drinkers, including low-risk drinkers, should know about the health risks connected with drinking alcohol. °RECOMMENDATIONS FOR LOW-RISK DRINKING  °Drink in moderation. Moderate drinking is defined as follows:  °· Men - no more than 2 drinks per day. °· Nonpregnant women - no more than 1 drink per day. °· Over age 65 - no more than 1 drink per day. °A standard drink is 12 grams of pure alcohol, which is equal to a 12 ounce bottle of beer or wine cooler, a 5 ounce glass of wine, or 1.5 ounces of distilled spirits (such as whiskey, brandy, vodka, or rum).  °ABSTAIN FROM (DO NOT DRINK) ALCOHOL: °· When pregnant or considering pregnancy. °· When taking a medication that interacts with alcohol. °· If you are alcohol dependent. °· A medical condition that prohibits drinking alcohol (such as ulcer, liver disease, or heart disease). °DISCUSS WITH YOUR CAREGIVER: °· If you are at risk for coronary heart disease, discuss the potential benefits and risks of alcohol use: Light to moderate drinking is associated with lower rates of coronary heart disease in certain populations (for example, men over age 45 and postmenopausal women). Infrequent or nondrinkers are advised not to begin light to moderate drinking to reduce the risk of coronary heart disease so as to avoid creating an alcohol-related problem. Similar protective effects can likely be gained through proper diet and exercise. °· Women and the elderly have smaller amounts of body water than men. As a result women and the elderly achieve a higher blood alcohol concentration after drinking the same amount of alcohol. °· Exposing a fetus to alcohol can cause a broad range of birth defects referred to as Fetal Alcohol Syndrome (FAS) or Alcohol-Related Birth Defects (ARBD). Although FAS/ARBD is connected with excessive  alcohol consumption during pregnancy, studies also have reported neurobehavioral problems in infants born to mothers reporting drinking an average of 1 drink per day during pregnancy. °· Heavier drinking (the consumption of more than 4 drinks per occasion by men and more than 3 drinks per occasion by women) impairs learning (cognitive) and psychomotor functions and increases the risk of alcohol-related problems, including accidents and injuries. °CAGE QUESTIONS:  °· Have you ever felt that you should Cut down on your drinking? °· Have people Annoyed you by criticizing your drinking? °· Have you ever felt bad or Guilty about your drinking? °· Have you ever had a drink first thing in the morning to steady your nerves or get rid of a hangover (Eye opener)? °If you answered positively to any of these questions: You may be at risk for alcohol-related problems if alcohol consumption is:  °· Men: Greater than 14 drinks per week or more than 4 drinks per occasion. °· Women: Greater than 7 drinks per week or more than 3 drinks per occasion. °Do you or your family have a medical history of alcohol-related problems, such as: °· Blackouts. °· Sexual dysfunction. °· Depression. °· Trauma. °· Liver dysfunction. °· Sleep disorders. °· Hypertension. °· Chronic abdominal pain. °· Has your drinking ever caused you problems, such as problems with your family, problems with your work (or school) performance, or accidents/injuries? °· Do you have a compulsion to drink or a preoccupation with drinking? °· Do you have poor control or are you unable to stop drinking once you have started? °· Do you have to drink to   avoid withdrawal symptoms? °· Do you have problems with withdrawal such as tremors, nausea, sweats, or mood disturbances? °· Does it take more alcohol than in the past to get you high? °· Do you feel a strong urge to drink? °· Do you change your plans so that you can have a drink? °· Do you ever drink in the morning to relieve  the shakes or a hangover? °If you have answered a number of the previous questions positively, it may be time for you to talk to your caregivers, family, and friends and see if they think you have a problem. Alcoholism is a chemical dependency that keeps getting worse and will eventually destroy your health and relationships. Many alcoholics end up dead, impoverished, or in prison. This is often the end result of all chemical dependency. °· Do not be discouraged if you are not ready to take action immediately. °· Decisions to change behavior often involve up and down desires to change and feeling like you cannot decide. °· Try to think more seriously about your drinking behavior. °· Think of the reasons to quit. °WHERE TO GO FOR ADDITIONAL INFORMATION  °· The National Institute on Alcohol Abuse and Alcoholism (NIAAA) °www.niaaa.nih.gov °· National Council on Alcoholism and Drug Dependence (NCADD) °www.ncadd.org °· American Society of Addiction Medicine (ASAM) °www.asam.org  °Document Released: 11/30/2005 Document Revised: 02/22/2012 Document Reviewed: 07/18/2008 °ExitCare® Patient Information ©2014 [employer]. ° °

## 2014-01-21 LAB — URINE CULTURE: Colony Count: >=100000

## 2014-01-22 ENCOUNTER — Telehealth (HOSPITAL_BASED_OUTPATIENT_CLINIC_OR_DEPARTMENT_OTHER): Payer: Self-pay

## 2014-01-22 NOTE — Telephone Encounter (Signed)
Post ED Visit - Positive Culture Follow-up  Culture report reviewed by antimicrobial stewardship pharmacist: []  Wes [place], Pharm.D., BCPS []  Heide Guile, Pharm.D., BCPS []  Alycia Rossetti, Pharm.D., BCPS [x]  Blue Ball, Pharm.D., BCPS, AAHIVP []  Legrand Como, Pharm.D., BCPS, AAHIVP  Positive urine culture Treated with cefazolin, organism sensitive to the same and no further patient follow-up is required at this time.  Ileene Musa 01/22/2014, 11:05 AM

## 2014-02-14 ENCOUNTER — Inpatient Hospital Stay (HOSPITAL_COMMUNITY)
Admission: EM | Admit: 2014-02-14 | Discharge: 2014-02-15 | DRG: 310 | Disposition: A | Discharge status: Home / Self Care | Payer: Medicare Other | Attending: Internal Medicine | Admitting: Internal Medicine

## 2014-02-14 ENCOUNTER — Encounter (HOSPITAL_COMMUNITY): Payer: Self-pay | Admitting: Emergency Medicine

## 2014-02-14 ENCOUNTER — Emergency Department (HOSPITAL_COMMUNITY): Payer: Medicare Other

## 2014-02-14 DIAGNOSIS — Z9114 Patient's other noncompliance with medication regimen: Secondary | ICD-10-CM

## 2014-02-14 DIAGNOSIS — F10239 Alcohol dependence with withdrawal, unspecified: Secondary | ICD-10-CM | POA: Diagnosis present

## 2014-02-14 DIAGNOSIS — Z9119 Patient's noncompliance with other medical treatment and regimen: Secondary | ICD-10-CM

## 2014-02-14 DIAGNOSIS — Z91199 Patient's noncompliance with other medical treatment and regimen due to unspecified reason: Secondary | ICD-10-CM

## 2014-02-14 DIAGNOSIS — F10939 Alcohol use, unspecified with withdrawal, unspecified: Secondary | ICD-10-CM

## 2014-02-14 DIAGNOSIS — E869 Volume depletion, unspecified: Secondary | ICD-10-CM | POA: Diagnosis present

## 2014-02-14 DIAGNOSIS — I498 Other specified cardiac arrhythmias: Secondary | ICD-10-CM | POA: Diagnosis present

## 2014-02-14 DIAGNOSIS — I509 Heart failure, unspecified: Secondary | ICD-10-CM | POA: Diagnosis present

## 2014-02-14 DIAGNOSIS — I10 Essential (primary) hypertension: Secondary | ICD-10-CM

## 2014-02-14 DIAGNOSIS — I369 Nonrheumatic tricuspid valve disorder, unspecified: Secondary | ICD-10-CM

## 2014-02-14 DIAGNOSIS — I4891 Unspecified atrial fibrillation: Secondary | ICD-10-CM

## 2014-02-14 DIAGNOSIS — M6282 Rhabdomyolysis: Secondary | ICD-10-CM

## 2014-02-14 DIAGNOSIS — F1010 Alcohol abuse, uncomplicated: Secondary | ICD-10-CM

## 2014-02-14 DIAGNOSIS — Z6379 Other stressful life events affecting family and household: Secondary | ICD-10-CM

## 2014-02-14 DIAGNOSIS — B1920 Unspecified viral hepatitis C without hepatic coma: Secondary | ICD-10-CM

## 2014-02-14 DIAGNOSIS — I4892 Unspecified atrial flutter: Principal | ICD-10-CM

## 2014-02-14 DIAGNOSIS — Z91148 Patient's other noncompliance with medication regimen for other reason: Secondary | ICD-10-CM

## 2014-02-14 DIAGNOSIS — Z8249 Family history of ischemic heart disease and other diseases of the circulatory system: Secondary | ICD-10-CM

## 2014-02-14 DIAGNOSIS — E860 Dehydration: Secondary | ICD-10-CM | POA: Diagnosis present

## 2014-02-14 DIAGNOSIS — Z833 Family history of diabetes mellitus: Secondary | ICD-10-CM

## 2014-02-14 DIAGNOSIS — R42 Dizziness and giddiness: Secondary | ICD-10-CM

## 2014-02-14 DIAGNOSIS — E861 Hypovolemia: Secondary | ICD-10-CM | POA: Diagnosis present

## 2014-02-14 DIAGNOSIS — F1020 Alcohol dependence, uncomplicated: Secondary | ICD-10-CM

## 2014-02-14 DIAGNOSIS — Z0000 Encounter for general adult medical examination without abnormal findings: Secondary | ICD-10-CM

## 2014-02-14 DIAGNOSIS — R569 Unspecified convulsions: Secondary | ICD-10-CM

## 2014-02-14 DIAGNOSIS — N179 Acute kidney failure, unspecified: Secondary | ICD-10-CM

## 2014-02-14 DIAGNOSIS — G40909 Epilepsy, unspecified, not intractable, without status epilepticus: Secondary | ICD-10-CM | POA: Diagnosis present

## 2014-02-14 DIAGNOSIS — E876 Hypokalemia: Secondary | ICD-10-CM | POA: Diagnosis not present

## 2014-02-14 LAB — URINE MICROSCOPIC-ADD ON

## 2014-02-14 LAB — CBC WITH DIFFERENTIAL/PLATELET
Basophils Absolute: 0 10*3/uL (ref 0.0–0.1)
Basophils Relative: 1 % (ref 0–1)
Eosinophils Absolute: 0.1 10*3/uL (ref 0.0–0.7)
Eosinophils Relative: 1 % (ref 0–5)
HCT: 44.9 % (ref 39.0–52.0)
HEMOGLOBIN: 15.7 g/dL (ref 13.0–17.0)
LYMPHS ABS: 1.9 10*3/uL (ref 0.7–4.0)
Lymphocytes Relative: 26 % (ref 12–46)
MCH: 28.4 pg (ref 26.0–34.0)
MCHC: 35 g/dL (ref 30.0–36.0)
MCV: 81.2 fL (ref 78.0–100.0)
Monocytes Absolute: 0.6 10*3/uL (ref 0.1–1.0)
Monocytes Relative: 8 % (ref 3–12)
NEUTROS ABS: 4.6 10*3/uL (ref 1.7–7.7)
NEUTROS PCT: 64 % (ref 43–77)
Platelets: 152 10*3/uL (ref 150–400)
RBC: 5.53 MIL/uL (ref 4.22–5.81)
RDW: 17.6 % — ABNORMAL HIGH (ref 11.5–15.5)
WBC: 7.2 10*3/uL (ref 4.0–10.5)

## 2014-02-14 LAB — CREATININE, SERUM: Creatinine, Ser: 0.79 mg/dL (ref 0.50–1.35)

## 2014-02-14 LAB — CBC
HCT: 44.3 % (ref 39.0–52.0)
HEMOGLOBIN: 15.3 g/dL (ref 13.0–17.0)
MCH: 28 pg (ref 26.0–34.0)
MCHC: 34.5 g/dL (ref 30.0–36.0)
MCV: 81.1 fL (ref 78.0–100.0)
Platelets: 131 10*3/uL — ABNORMAL LOW (ref 150–400)
RBC: 5.46 MIL/uL (ref 4.22–5.81)
RDW: 17.7 % — AB (ref 11.5–15.5)
WBC: 6.8 10*3/uL (ref 4.0–10.5)

## 2014-02-14 LAB — BASIC METABOLIC PANEL
BUN: 9 mg/dL (ref 6–23)
BUN: 8 mg/dL (ref 6–23)
CHLORIDE: 102 meq/L (ref 96–112)
CHLORIDE: 102 meq/L (ref 96–112)
CO2: 23 mEq/L (ref 19–32)
CO2: 26 mEq/L (ref 19–32)
Calcium: 8.9 mg/dL (ref 8.4–10.5)
Calcium: 8.9 mg/dL (ref 8.4–10.5)
Creatinine, Ser: 0.86 mg/dL (ref 0.50–1.35)
Creatinine, Ser: 0.81 mg/dL (ref 0.50–1.35)
GFR calc Af Amer: >90 mL/min (ref >90)
GFR calc non Af Amer: >90 mL/min (ref >90)
GFR calc non Af Amer: >90 mL/min (ref >90)
GLUCOSE: 91 mg/dL (ref 70–99)
Glucose, Bld: 122 mg/dL — ABNORMAL HIGH (ref 70–99)
POTASSIUM: 5.1 meq/L (ref 3.7–5.3)
Potassium: 4.2 mEq/L (ref 3.7–5.3)
Sodium: 142 mEq/L (ref 137–147)
Sodium: 142 mEq/L (ref 137–147)

## 2014-02-14 LAB — URINALYSIS, ROUTINE W REFLEX MICROSCOPIC
Glucose, UA: NEGATIVE mg/dL
Hgb urine dipstick: NEGATIVE
Ketones, ur: NEGATIVE mg/dL
LEUKOCYTES UA: NEGATIVE
NITRITE: NEGATIVE
PH: 5 (ref 5.0–8.0)
Protein, ur: 30 mg/dL — AB
Specific Gravity, Urine: 1.02 (ref 1.005–1.030)
Urobilinogen, UA: 1 mg/dL (ref 0.0–1.0)

## 2014-02-14 LAB — PRO B NATRIURETIC PEPTIDE: Pro B Natriuretic peptide (BNP): 1322 pg/mL — ABNORMAL HIGH (ref 0–125)

## 2014-02-14 LAB — RAPID URINE DRUG SCREEN, HOSP PERFORMED
AMPHETAMINES: NOT DETECTED
BENZODIAZEPINES: NOT DETECTED
Barbiturates: NOT DETECTED
Cocaine: NOT DETECTED
OPIATES: NOT DETECTED
Tetrahydrocannabinol: NOT DETECTED

## 2014-02-14 LAB — ETHANOL: Alcohol, Ethyl (B): <11 mg/dL (ref 0–11)

## 2014-02-14 LAB — TROPONIN I: Troponin I: <0.3 ng/mL (ref <0.30)

## 2014-02-14 LAB — MRSA PCR SCREENING: MRSA BY PCR: NEGATIVE

## 2014-02-14 MED ORDER — FOLIC ACID 1 MG PO TABS
1.0000 mg | ORAL_TABLET | Freq: Every day | ORAL | Status: DC
Start: 2014-02-14 — End: 2014-02-15
  Administered 2014-02-15: 1 mg via ORAL
  Filled 2014-02-14: qty 1

## 2014-02-14 MED ORDER — FOLIC ACID 5 MG/ML IJ SOLN
1.0000 mg | Freq: Every day | INTRAMUSCULAR | Status: DC
  Filled 2014-02-14: qty 0.2

## 2014-02-14 MED ORDER — ADULT MULTIVITAMIN W/MINERALS CH
1.0000 | ORAL_TABLET | Freq: Every day | ORAL | Status: DC
  Administered 2014-02-15: 1 via ORAL
  Filled 2014-02-14: qty 1

## 2014-02-14 MED ORDER — SODIUM CHLORIDE 0.9 % IV SOLN
INTRAVENOUS | Status: DC
Start: 2014-02-14 — End: 2014-02-14

## 2014-02-14 MED ORDER — SODIUM CHLORIDE 0.9 % IJ SOLN
3.0000 mL | Freq: Two times a day (BID) | INTRAMUSCULAR | Status: DC
  Administered 2014-02-14 – 2014-02-15 (×2): 3 mL via INTRAVENOUS

## 2014-02-14 MED ORDER — THIAMINE HCL 100 MG/ML IJ SOLN
100.0000 mg | Freq: Every day | INTRAMUSCULAR | Status: DC
  Administered 2014-02-15: 100 mg via INTRAVENOUS
  Filled 2014-02-14: qty 1

## 2014-02-14 MED ORDER — DILTIAZEM HCL ER COATED BEADS 360 MG PO CP24
360.0000 mg | ORAL_CAPSULE | Freq: Every day | ORAL | Status: DC
  Administered 2014-02-14 – 2014-02-15 (×2): 360 mg via ORAL
  Filled 2014-02-14 (×2): qty 1

## 2014-02-14 MED ORDER — DILTIAZEM LOAD VIA INFUSION
20.0000 mg | Freq: Once | INTRAVENOUS | Status: AC
  Administered 2014-02-14: 20 mg via INTRAVENOUS
  Filled 2014-02-14: qty 20

## 2014-02-14 MED ORDER — THIAMINE HCL 100 MG/ML IJ SOLN: Freq: Once | INTRAVENOUS | Status: DC

## 2014-02-14 MED ORDER — DILTIAZEM HCL 100 MG IV SOLR
5.0000 mg/h | INTRAVENOUS | Status: DC
  Administered 2014-02-14: 5 mg/h via INTRAVENOUS
  Filled 2014-02-14 (×2): qty 100

## 2014-02-14 MED ORDER — LORAZEPAM 2 MG/ML IJ SOLN
2.0000 mg | INTRAMUSCULAR | Status: DC | PRN
  Administered 2014-02-14: 2 mg via INTRAVENOUS
  Filled 2014-02-14 (×2): qty 1

## 2014-02-14 MED ORDER — THIAMINE HCL 100 MG/ML IJ SOLN
Freq: Once | INTRAVENOUS | Status: AC
  Administered 2014-02-14: 15:00:00 via INTRAVENOUS
  Filled 2014-02-14: qty 1000

## 2014-02-14 MED ORDER — METOPROLOL TARTRATE 25 MG PO TABS
25.0000 mg | ORAL_TABLET | Freq: Two times a day (BID) | ORAL | Status: DC
  Administered 2014-02-14 – 2014-02-15 (×2): 25 mg via ORAL
  Filled 2014-02-14 (×4): qty 1

## 2014-02-14 MED ORDER — HEPARIN SODIUM (PORCINE) 5000 UNIT/ML IJ SOLN
5000.0000 [IU] | Freq: Three times a day (TID) | INTRAMUSCULAR | Status: DC
  Administered 2014-02-14 – 2014-02-15 (×3): 5000 [IU] via SUBCUTANEOUS
  Filled 2014-02-14 (×6): qty 1

## 2014-02-14 MED ORDER — METOPROLOL TARTRATE 12.5 MG HALF TABLET
12.5000 mg | ORAL_TABLET | Freq: Two times a day (BID) | ORAL | Status: DC
  Administered 2014-02-14: 12.5 mg via ORAL
  Filled 2014-02-14 (×2): qty 1

## 2014-02-14 NOTE — ED Notes (Signed)
Pt HR 101, Dr. Betsey Holiday requesting HR under 100. Cardizem increased to 10mg /hr
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
## 2014-02-14 NOTE — ED Notes (Signed)
Per ems, pt was walking down the street and suddenly felt dizzy. ems lung sounds clear, 12 lead unremarkable. Occaisionally PVC. Pt ambulatory from stretcher to bed, steady gait. Pt AAOX4 in NAD. States a few times today his vision blurred up. Pt denies falling. States he sat down to avoid falling. Pt ETOH every day, denies drinking today. 145/107, 150, 97%. Pt denies dizziness, CP at this time, denies HA.

## 2014-02-14 NOTE — ED Notes (Signed)
HR. 110, increasing cardizem. MD notified.

## 2014-02-14 NOTE — ED Notes (Signed)
Per Dr. Betsey Holiday, [facility] to give 10mg  bolus. 10 mg bolus given.

## 2014-02-14 NOTE — ED Notes (Signed)
Admitting MD at bedside.

## 2014-02-14 NOTE — H&P (Signed)
CSN: [id number]     Arrival date & time 02/14/14  1125 History   First MD Initiated Contact with Patient 02/14/14 1131     Chief Complaint  Patient presents with  . Near Syncope     (Consider location/radiation/quality/duration/timing/severity/associated sxs/prior Treatment) HPI Comments: Patient presents to the ER for evaluation of dizziness and near syncope. Patient was reportedly walking when symptoms began. Patient states that he felt like he was going to pass out, was slowly helped to the ground. He did not fall and there was no complete loss of consciousness. Patient feels very weak and has been Deziel of breath. He did have some chest pain earlier, currently none.  Patient is a 63 y.o. male presenting with near-syncope.  Near Syncope Associated symptoms include chest pain and shortness of breath.    Past Medical History  Diagnosis Date  . CHF (congestive heart failure)   . Alcohol abuse   . Seizure   . Noncompliance with medication treatment due to intermittent use of medication 02/02/2012  . HTN (hypertension) 02/02/2012  . Alcohol withdrawal seizure 02/02/2012  . Hypertension   . Dysrhythmia     paroxismal atrial fib  . Paroxysmal Atrial Flutter 02/01/2012  . Hepatitis C 02/02/2012    Unsure if he was ever diagnosed with Hep C  . Hepatitis C    Past Surgical History  Procedure Laterality Date  . Forearm reconstruction     Family History  Problem Relation Age of Onset  . Heart disease Father   . Alcohol abuse Father   . Alcohol abuse Brother   . Diabetes type II Mother    History  Substance Use Topics  . Smoking status: Never Smoker   . Smokeless tobacco: Never Used  . Alcohol Use: Yes     Comment: statrts drinking everyday at 5am drinks about 2 fiths been drinking for about 10 years    Review of Systems  Respiratory: Positive for shortness of breath.   Cardiovascular: Positive for chest pain and near-syncope.  Neurological: Positive for dizziness and weakness.   All other systems reviewed and are negative.      Allergies  Review of patient's allergies indicates no known allergies.  Home Medications  No current outpatient prescriptions on file. BP 144/94  Pulse 102  Temp(Src) 98.7 F (37.1 C) (Oral)  Resp 17  SpO2 98% Physical Exam  Constitutional: He is oriented to person, place, and time. He appears well-developed and well-nourished. No distress.  HENT:  Head: Normocephalic and atraumatic.  Right Ear: Hearing normal.  Left Ear: Hearing normal.  Nose: Nose normal.  Mouth/Throat: Oropharynx is clear and moist and mucous membranes are normal.  Eyes: Conjunctivae and EOM are normal. Pupils are equal, round, and reactive to light.  Neck: Normal range of motion. Neck supple.  Cardiovascular: Regular rhythm, S1 normal and S2 normal.  Tachycardia present.  Exam reveals no gallop and no friction rub.   No murmur heard. Pulmonary/Chest: Effort normal and breath sounds normal. No respiratory distress. He exhibits no tenderness.  Abdominal: Soft. Normal appearance and bowel sounds are normal. There is no hepatosplenomegaly. There is no tenderness. There is no rebound, no guarding, no tenderness at McBurney's point and negative Murphy's sign. No hernia.  Musculoskeletal: Normal range of motion.  Neurological: He is alert and oriented to person, place, and time. He has normal strength. No cranial nerve deficit or sensory deficit. Coordination normal. GCS eye subscore is 4. GCS verbal subscore is 5. GCS motor subscore is  6.  Skin: Skin is warm, dry and intact. No rash noted. No cyanosis.  Psychiatric: He has a normal mood and affect. His speech is normal and behavior is normal. Thought content normal.    ED Course  Procedures (including critical care time) Labs Review Labs Reviewed  CBC WITH DIFFERENTIAL - Abnormal; Notable for the following:    RDW 17.6 (*)    All other components within normal limits  PRO B NATRIURETIC PEPTIDE - Abnormal;  Notable for the following:    Pro B Natriuretic peptide (BNP) 1322.0 (*)    All other components within normal limits  URINALYSIS, ROUTINE W REFLEX MICROSCOPIC - Abnormal; Notable for the following:    Color, Urine AMBER (*)    Bilirubin Urine SMALL (*)    Protein, ur 30 (*)    All other components within normal limits  URINE MICROSCOPIC-ADD ON - Abnormal; Notable for the following:    Squamous Epithelial / LPF FEW (*)    Bacteria, UA FEW (*)    Casts GRANULAR CAST (*)    All other components within normal limits  TROPONIN I  URINE RAPID DRUG SCREEN (HOSP PERFORMED)  BASIC METABOLIC PANEL  ETHANOL   Imaging Review Dg Chest Port 1 View  02/14/2014   CLINICAL DATA:  Shortness of breath.  Hypertension.  EXAM: PORTABLE CHEST - 1 VIEW  COMPARISON:  Single view of the chest 05/06/2013.  FINDINGS: There is cardiomegaly without edema. No pneumothorax or pleural fluid. No focal bony abnormality.  IMPRESSION: Cardiomegaly without acute disease.   Electronically Signed   By: Inge Rise M.D.   On: 02/14/2014 12:12     EKG Interpretation   Date/Time:  Wednesday February 14 2014 11:39:17 EST Ventricular Rate:  150 PR Interval:  80 QRS Duration: 138 QT Interval:  356 QTC Calculation: 562 R Axis:   -19 Text Interpretation:  Atrial flutter Consider right atrial enlargement  Nonspecific ST/T abnormalities, rate related compared to previous ECG,  Atrial Flutter now present Confirmed by POLLINA  MD, [place] [phone])  on 02/14/2014 1:21:13 PM     EKG Interpretation   Date/Time:  Wednesday February 14 2014 13:24 EST Ventricular Rate:  102 PR Interval:   QRS Duration: 142 QT Interval:  367 QTC Calculation: 479  Text Interpretation:  Atrial flutter Consider right atrial enlargement  Nonspecific ST/T abnormalities, rate related compared to previous ECG,  Atrial Flutter now present Confirmed by POLLINA  MD, [place] [phone])  on 02/14/2014  MDM   Final diagnoses:  Atrial Flutter (2:1  block) with AVR Near Syncope   Presents to the ER for evaluation of near-syncope. He is brought to the ER by EMS. He has normal neurologic evaluation upon arrival. He was found to be in atrial flutter with a ventricular rate of 150 beats per minute. Reviewing the patient's records reveals that he does have a previous history of paroxysmal atrial flutter. Patient was initiated on rate control with Cardizem bolus and Cardizem drip. Heart rate is now around 100 beats per minute. Symptoms are improved. Patient will require hospitalization for further management.  Patient will be admitted by internal medicine. I have consulted cardiology to manage his heart rate, but as the patient does have a history of chronic alcoholism, seizure disorder, he will require an internal medicine service for primary management. Cardiology will consult.    Orpah Greek, MD 02/14/14 [phone]

## 2014-02-14 NOTE — Progress Notes (Signed)
  Echocardiogram 2D Echocardiogram has been performed.  Mauricio Po 02/14/2014, 5:50 PM

## 2014-02-14 NOTE — Progress Notes (Signed)
Utilization Review Completed.Hideo Googe T3/03/2014  

## 2014-02-14 NOTE — Consult Note (Signed)
Reason for Consult: Atrial flutter  Requesting Physician: Verlon Au  Cardiologist: Aundra Dubin  HPI: This is a 64 y.o. male with a past medical history significant for atrial flutter in 2013, likely associated with alcohol abuse. The arrhythmia resolved spontaneously and anticoagulation was withheld due to noncompliance with meds and alcohol abuse. He continues to drink heavily.   He presents with recurrent presyncope and atrial flutter with rapid ventricular rate. On iv diltiazem 20 mg/h, rate is reasonably well controlled. 3:1 block while awake (VR around 100), 4:1 block when asleep (VR 75 bpm).  PMHx:  Past Medical History  Diagnosis Date  . CHF (congestive heart failure)   . Alcohol abuse   . Seizure   . Noncompliance with medication treatment due to intermittent use of medication 02/02/2012  . HTN (hypertension) 02/02/2012  . Alcohol withdrawal seizure 02/02/2012  . Hypertension   . Dysrhythmia     paroxismal atrial fib  . Paroxysmal Atrial Flutter 02/01/2012  . Hepatitis C 02/02/2012    Unsure if he was ever diagnosed with Hep C  . Hepatitis C    Past Surgical History  Procedure Laterality Date  . Forearm reconstruction      FAMHx: Family History  Problem Relation Age of Onset  . Heart disease Father   . Alcohol abuse Father   . Alcohol abuse Brother   . Diabetes type II Mother     SOCHx:  reports that he has never smoked. He has never used smokeless tobacco. He reports that he drinks alcohol. He reports that he uses illicit drugs ("Crack" cocaine).  ALLERGIES: No Known Allergies  ROS: He had dizziness/near-syncope, now resolved The patient specifically denies any chest pain at rest exertion, dyspnea at rest or with exertion, orthopnea, paroxysmal nocturnal dyspnea, palpitations, focal neurological deficits, intermittent claudication, lower extremity edema, unexplained weight gain, cough, hemoptysis or wheezing.   HOME MEDICATIONS: No prescriptions prior  to admission    HOSPITAL MEDICATIONS: I have reviewed the patient's current medications. Prior to Admission:  No prescriptions prior to admission   Scheduled: . folic acid  1 mg Oral Daily  . [START ON 07/15/9936] folic acid  1 mg Intravenous Daily  . heparin  5,000 Units Subcutaneous 3 times per day  . metoprolol tartrate  12.5 mg Oral BID  . multivitamin with minerals  1 tablet Oral Daily  . sodium chloride  3 mL Intravenous Q12H  . thiamine  100 mg Intravenous Daily   Continuous: . diltiazem (CARDIZEM) infusion 15 mg/hr (02/14/14 1500)    VITALS: Blood pressure 141/99, pulse 102, temperature 97.4 F (36.3 C), temperature source Oral, resp. rate 12, height 6\' 3"  (1.905 m), weight 87.091 kg (192 lb), SpO2 98.00%.  PHYSICAL EXAM:  General: Alert, oriented x3, no distress Head: no evidence of trauma, PERRL, EOMI, no exophtalmos or lid lag, no myxedema, no xanthelasma; normal ears, nose and oropharynx Neck: normal jugular venous pulsations and no hepatojugular reflux; brisk carotid pulses without delay and no carotid bruits Chest: clear to auscultation, no signs of consolidation by percussion or palpation, normal fremitus, symmetrical and full respiratory excursions Cardiovascular: normal position and quality of the apical impulse, regular rhythm, normal first heart sound and normal second heart sound, no rubs or gallops, no murmur Abdomen: no tenderness or distention, no masses by palpation, no abnormal pulsatility or arterial bruits, normal bowel sounds, no hepatosplenomegaly Extremities: no clubbing, cyanosis;  no edema; 2+ radial, ulnar and brachial pulses bilaterally; 2+ right femoral, posterior tibial and  dorsalis pedis pulses; 2+ left femoral, posterior tibial and dorsalis pedis pulses; no subclavian or femoral bruits Neurological: grossly nonfocal   LABS  CBC  Recent Labs  02/14/14 1145  WBC 7.2  NEUTROABS 4.6  HGB 15.7  HCT 44.9  MCV 81.2  PLT 983   Basic  Metabolic Panel  Recent Labs  02/14/14 1145  NA 142  K 5.1  CL 102  CO2 23  GLUCOSE 91  BUN 9  CREATININE 0.86  CALCIUM 8.9   Cardiac Enzymes  Recent Labs  02/14/14 1145  TROPONINI <0.30   IMAGING: Dg Chest Port 1 View  02/14/2014   CLINICAL DATA:  Shortness of breath.  Hypertension.  EXAM: PORTABLE CHEST - 1 VIEW  COMPARISON:  Single view of the chest 05/06/2013.  FINDINGS: There is cardiomegaly without edema. No pneumothorax or pleural fluid. No focal bony abnormality.  IMPRESSION: Cardiomegaly without acute disease.   Electronically Signed   By: Inge Rise M.D.   On: 02/14/2014 12:12    ECG: Atrial flutter, minor IVCD  TELEMETRY: Persistent AF with gradually increasing degree of AV block  IMPRESSION: 1. Persistent atrial flutter 2. Alcohol abuse 3. History of noncompliance with meds and follow up 4. HTN  RECOMMENDATION: 1. Switch IV to PO diltiazem, but he will need a second agent for rate control - prefer a small dose of beta blocker. In 2013, the regimen that worked was diltiazem CD 360 and metoprolol tartrate 50AM + 25PM. 2. Will follow up on echo results - may have developed interim tachycardia-related or alcoholic cardiomyopathy (but no CHF clinically) 3. ASA remains best option for stroke prevention since long term compliance with anticoagulation follow up is unlikely and risk of bleeding complications is high 4. There is a good chance the arrhythmia will spontaneously resolve the longer he abstains from alcohol  Time Spent Directly with Patient: 45 minutes  Sanda Klein, MD, [facility] [phone] office [phone] pager   02/14/2014, 3:55 PM

## 2014-02-15 DIAGNOSIS — E869 Volume depletion, unspecified: Secondary | ICD-10-CM

## 2014-02-15 DIAGNOSIS — F1020 Alcohol dependence, uncomplicated: Secondary | ICD-10-CM

## 2014-02-15 DIAGNOSIS — R42 Dizziness and giddiness: Secondary | ICD-10-CM

## 2014-02-15 DIAGNOSIS — I4892 Unspecified atrial flutter: Principal | ICD-10-CM

## 2014-02-15 LAB — COMPREHENSIVE METABOLIC PANEL
ALT: 69 U/L — ABNORMAL HIGH (ref 0–53)
AST: 88 U/L — ABNORMAL HIGH (ref 0–37)
Albumin: 3.1 g/dL — ABNORMAL LOW (ref 3.5–5.2)
Alkaline Phosphatase: 56 U/L (ref 39–117)
BILIRUBIN TOTAL: 1.5 mg/dL — AB (ref 0.3–1.2)
BUN: 7 mg/dL (ref 6–23)
CALCIUM: 8.4 mg/dL (ref 8.4–10.5)
CO2: 26 mEq/L (ref 19–32)
Chloride: 101 mEq/L (ref 96–112)
Creatinine, Ser: 0.81 mg/dL (ref 0.50–1.35)
GFR calc non Af Amer: >90 mL/min (ref >90)
Glucose, Bld: 90 mg/dL (ref 70–99)
Potassium: 3.5 mEq/L — ABNORMAL LOW (ref 3.7–5.3)
SODIUM: 140 meq/L (ref 137–147)
TOTAL PROTEIN: 6.5 g/dL (ref 6.0–8.3)

## 2014-02-15 LAB — CBC
HCT: 40 % (ref 39.0–52.0)
Hemoglobin: 13.8 g/dL (ref 13.0–17.0)
MCH: 27.7 pg (ref 26.0–34.0)
MCHC: 34.5 g/dL (ref 30.0–36.0)
MCV: 80.3 fL (ref 78.0–100.0)
PLATELETS: 134 10*3/uL — AB (ref 150–400)
RBC: 4.98 MIL/uL (ref 4.22–5.81)
RDW: 17.4 % — AB (ref 11.5–15.5)
WBC: 5.6 10*3/uL (ref 4.0–10.5)

## 2014-02-15 LAB — GLUCOSE, CAPILLARY: Glucose-Capillary: 150 mg/dL — ABNORMAL HIGH (ref 70–99)

## 2014-02-15 LAB — PROTIME-INR
INR: 1.13 (ref 0.00–1.49)
PROTHROMBIN TIME: 14.3 s (ref 11.6–15.2)

## 2014-02-15 LAB — MAGNESIUM: MAGNESIUM: 1.5 mg/dL (ref 1.5–2.5)

## 2014-02-15 LAB — PHOSPHORUS: Phosphorus: 3.2 mg/dL (ref 2.3–4.6)

## 2014-02-15 MED ORDER — POTASSIUM CHLORIDE CRYS ER 20 MEQ PO TBCR
40.0000 meq | EXTENDED_RELEASE_TABLET | Freq: Once | ORAL | Status: AC
  Administered 2014-02-15: 40 meq via ORAL
  Filled 2014-02-15: qty 2

## 2014-02-15 MED ORDER — METOPROLOL TARTRATE 25 MG PO TABS: ORAL_TABLET | ORAL | Status: DC

## 2014-02-15 MED ORDER — METOPROLOL TARTRATE 50 MG PO TABS: 50.0000 mg | ORAL_TABLET | Freq: Two times a day (BID) | ORAL | Status: DC

## 2014-02-15 MED ORDER — VERAPAMIL HCL 120 MG PO TABS: 120.0000 mg | ORAL_TABLET | Freq: Three times a day (TID) | ORAL | Status: DC

## 2014-02-15 NOTE — Progress Notes (Signed)
Pt discharged to home; discharge instructions given to patient and all questioned answered; pt's prescriptions and community resources given to patient to take home; pt refuses to be taken to discharge by NT in wheelchair; all belongings returned to pt which included his home clothing.

## 2014-02-15 NOTE — Discharge Instructions (Signed)
Alcohol and Nutrition Nutrition serves two purposes. It provides energy. It also maintains body structure and function. Food supplies energy. It also provides the building blocks needed to replace worn or damaged cells. Alcoholics often eat poorly. This limits their supply of essential nutrients. This affects energy supply and structure maintenance. Alcohol also affects the body's nutrients in:  Digestion.  Storage.  Using and getting rid of waste products. IMPAIRMENT OF NUTRIENT DIGESTION AND UTILIZATION   Once ingested, food must be broken down into small components (digested). Then it is available for energy. It helps maintain body structure and function. Digestion begins in the mouth. It continues in the stomach and intestines, with help from the pancreas. The nutrients from digested food are absorbed from the intestines into the blood. Then they are carried to the liver. The liver prepares nutrients for:  Immediate use.  Storage and future use.  Alcohol inhibits the breakdown of nutrients into usable molecules.  It decreases secretion of digestive enzymes from the pancreas.  Alcohol impairs nutrient absorption by damaging the cells lining the stomach and intestines.  It also interferes with moving some nutrients into the blood.  In addition, nutritional deficiencies themselves may lead to further absorption problems.  For example, folate deficiency changes the cells that line the small intestine. This impairs how water is absorbed. It also affects absorbed nutrients. These include glucose, sodium, and additional folate.  Even if nutrients are digested and absorbed, alcohol can prevent them from being fully used. It changes their transport, storage, and excretion. Impaired utilization of nutrients by alcoholics is indicated by:  Decreased liver stores of vitamins, such as vitamin A.  Increased excretion of nutrients such as fat. ALCOHOL AND ENERGY SUPPLY   Three basic  nutritional components found in food are:  Carbohydrates.  Proteins.  Fats.  These are used as energy. Some alcoholics take in as much as 50% of their total daily calories from alcohol. They often neglect important foods.  Even when enough food is eaten, alcohol can impair the ways the body controls blood sugar (glucose) levels. It may either increase or decrease blood sugar.  In non-diabetic alcoholics, increased blood sugar (hyperglycemia) is caused by poor insulin secretion. It is usually temporary.  Decreased blood sugar (hypoglycemia) can cause serious injury even if this condition is [place]-lived. Low blood sugar can happen when a fasting or malnourished person drinks alcohol. When there is no food to supply energy, stored sugar is used up. The products of alcohol inhibit forming glucose from other compounds such as amino acids. As a result, alcohol causes the brain and other body tissue to lack glucose. It is needed for energy and function.  Alcohol is an energy source. But how the body processes and uses the energy from alcohol is complex. Also, when alcohol is substituted for carbohydrates, subjects tend to lose weight. This indicates that they get less energy from alcohol than from food. ALCOHOL - MAINTAINING CELL STRUCTURE AND FUNCTION  Structure Cells are made mostly of protein. So an adequate protein diet is important for maintaining cell structure. This is especially true if cells are being damaged. Research indicates that alcohol affects protein nutrition by causing impaired:  Digestion of proteins to amino acids.  Processing of amino acids by the small intestine and liver.  Synthesis of proteins from amino acids.  Protein secretion by the liver. Function Nutrients are essential for the body to function well. They provide the tools that the body needs to work well:  Proteins.  Vitamins.  Minerals. Alcohol can disrupt body function. It may cause nutrient  deficiencies. And it may interfere with the way nutrients are processed. Vitamins  Vitamins are essential to maintain growth and normal metabolism. They regulate many of the body`s processes. Chronic heavy drinking causes deficiencies in many vitamins. This is caused by eating less. And, in some cases, vitamins may be poorly absorbed. For example, alcohol inhibits fat absorption. It impairs how the vitamins A, E, and D are normally absorbed along with dietary fats. Not enough vitamin A may cause night blindness. Not enough vitamin D may cause softening of the bones.  Some alcoholics lack vitamins A, C, D, E, K, and the B vitamins. These are all involved in wound healing and cell maintenance. In particular, because vitamin K is necessary for blood clotting, lacking that vitamin can cause delayed clotting. The result is excess bleeding. Lacking other vitamins involved in brain function may cause severe neurological damage. Minerals Deficiencies of minerals such as calcium, magnesium, iron, and zinc are common in alcoholics. The alcohol itself does not seem to affect how these minerals are absorbed. Rather, they seem to occur secondary to other alcohol-related problems, such as:  Less calcium absorbed.  Not enough magnesium.  More urinary excretion.  Vomiting.  Diarrhea.  Not enough iron due to gastrointestinal bleeding.  Not enough zinc or losses related to other nutrient deficiencies.  Mineral deficiencies can cause a variety of medical consequences. These range from calcium-related bone disease to zinc-related night blindness and skin lesions. ALCOHOL, MALNUTRITION, AND MEDICAL COMPLICATIONS  Liver Disease   Alcoholic liver damage is caused primarily by alcohol itself. But poor nutrition may increase the risk of alcohol-related liver damage. For example, nutrients normally found in the liver are known to be affected by drinking alcohol. These include carotenoids, which are the major  sources of vitamin A, and vitamin E compounds. Decreases in such nutrients may play some role in alcohol-related liver damage. Pancreatitis  Research suggests that malnutrition may increase the risk of developing alcoholic pancreatitis. Research suggests that a diet lacking in protein may increase alcohol's damaging effect on the pancreas. Brain  Nutritional deficiencies may have severe effects on brain function. These may be permanent. Specifically, thiamine deficiencies are often seen in alcoholics. They can cause severe neurological problems. These include:  Impaired movement.  Memory loss seen in Wernicke-Korsakoff syndrome. Pregnancy  Alcohol has toxic effects on fetal development. It causes alcohol-related birth defects. They include fetal alcohol syndrome. Alcohol itself is toxic to the fetus. Also, the nutritional deficiency can affect how the fetus develops. That may compound the risk of developmental damage.  Nutritional needs during pregnancy are 10% to 30% greater than normal. Food intake can increase by as much as 140% to cover the needs of both mother and fetus. An alcoholic mother`s nutritional problems may adversely affect the nutrition of the fetus. And alcohol itself can also restrict nutrition flow to the fetus. NUTRITIONAL STATUS OF ALCOHOLICS  Techniques for assessing nutritional status include:  Taking body measurements to estimate fat reserves. They include:  Weight.  Height.  Mass.  Skin fold thickness.  Performing blood analysis to provide measurements of circulating:  Proteins.  Vitamins.  Minerals.  These techniques tend to be imprecise. For many nutrients, there is no clear "cut-off" point that would allow an accurate definition of deficiency. So assessing the nutritional status of alcoholics is limited by these techniques. Dietary status may provide information about the risk of developing nutritional problems.  Dietary status is assessed by:  Taking  patients' dietary histories.  Evaluating the amount and types of food they are eating.  It is difficult to determine what exact amount of alcohol begins to have damaging effects on nutrition. In general, moderate drinkers have 2 drinks or less per day. They seem to be at little risk for nutritional problems. Various medical disorders begin to appear at greater levels.  Research indicates that the majority of even the heaviest drinkers have few obvious nutritional deficiencies. Many alcoholics who are hospitalized for medical complications of their disease do have severe malnutrition. Alcoholics tend to eat poorly. Often they eat less than the amounts of food necessary to provide enough:  Carbohydrates.  Protein.  Fat.  Vitamins A and C.  B vitamins.  Minerals like calcium and iron. Of major concern is alcohol's effect on digesting food and use of nutrients. It may shift a mildly malnourished person toward severe malnutrition. Document Released: 09/24/2005 Document Revised: 02/22/2012 Document Reviewed: 03/10/2006 [facility] Patient Information 2014 [place].  Alcohol Withdrawal Anytime drug use is interfering with normal living activities it has become abuse. This includes problems with family and friends. Psychological dependence has developed when your mind tells you that the drug is needed. This is usually followed by physical dependence when a continuing increase of drugs are required to get the same feeling or "high." This is known as addiction or chemical dependency. A person's risk is much higher if there is a history of chemical dependency in the family. Mild Withdrawal Following Stopping Alcohol, When Addiction or Chemical Dependency Has Developed When a person has developed tolerance to alcohol, any sudden stopping of alcohol can cause uncomfortable physical symptoms. Most of the time these are mild and consist of tremors in the hands and increases in heart rate, breathing,  and temperature. Sometimes these symptoms are associated with anxiety, panic attacks, and bad dreams. There may also be stomach upset. Normal sleep patterns are often interrupted with periods of inability to sleep (insomnia). This may last for 6 months. Because of this discomfort, many people choose to continue drinking to get rid of this discomfort and to try to feel normal. Severe Withdrawal with Decreased or No Alcohol Intake, When Addiction or Chemical Dependency Has Developed About five percent of alcoholics will develop signs of severe withdrawal when they stop using alcohol. One sign of this is development of generalized seizures (convulsions). Other signs of this are severe agitation and confusion. This may be associated with believing in things which are not real or seeing things which are not really there (delusions and hallucinations). Vitamin deficiencies are usually present if alcohol intake has been long-term. Treatment for this most often requires hospitalization and close observation. Addiction can only be helped by stopping use of all chemicals. This is hard but may save your life. With continual alcohol use, possible outcomes are usually loss of self respect and esteem, violence, and death. Addiction cannot be cured but it can be stopped. This often requires outside help and the care of professionals. Treatment centers are listed in the yellow pages under Cocaine, Narcotics, and Alcoholics Anonymous. Most hospitals and clinics can refer you to a specialized care center. It is not necessary for you to go through the uncomfortable symptoms of withdrawal. Your caregiver can provide you with medicines that will help you through this difficult period. Try to avoid situations, friends, or drugs that made it possible for you to keep using alcohol in the past. Learn how to  say no. It takes a long period of time to overcome addictions to all drugs, including alcohol. There may be many times when you  feel as though you want a drink. After getting rid of the physical addiction and withdrawal, you will have a lessening of the craving which tells you that you need alcohol to feel normal. Call your caregiver if more support is needed. Learn who to talk to in your family and among your friends so that during these periods you can receive outside help. Alcoholics Anonymous (AA) has helped many people over the years. To get further help, contact AA or call your caregiver, counselor, or clergyperson. Al-Anon and Alateen are support groups for friends and family members of an alcoholic. The people who love and care for an alcoholic often need help, too. For information about these organizations, check your phone directory or call a local alcoholism treatment center.  SEEK IMMEDIATE MEDICAL CARE IF:   You have a seizure.  You have a fever.  You experience uncontrolled vomiting or you vomit up blood. This may be bright red or look like black coffee grounds.  You have blood in the stool. This may be bright red or appear as a black, tarry, bad-smelling stool.  You become lightheaded or faint. Do not drive if you feel this way. Have someone else drive you or call 409 for help.  You become more agitated or confused.  You develop uncontrolled anxiety.  You begin to see things that are not really there (hallucinate). Your caregiver has determined that you completely understand your medical condition, and that your mental state is back to normal. You understand that you have been treated for alcohol withdrawal, have agreed not to drink any alcohol for a minimum of 1 day, will not operate a car or other machinery for 24 hours, and have had an opportunity to ask any questions about your condition. Document Released: 09/09/2005 Document Revised: 02/22/2012 Document Reviewed: 07/18/2008 [facility] Patient Information 2014 [place].  Atrial Fibrillation Atrial fibrillation is a type of irregular heart  rhythm (arrhythmia). During atrial fibrillation, the upper chambers of the heart (atria) quiver continuously in a chaotic pattern. This causes an irregular and often rapid heart rate.  Atrial fibrillation is the result of the heart becoming overloaded with disorganized signals that tell it to beat. These signals are normally released one at a time by a part of the right atrium called the sinoatrial node. They then travel from the atria to the lower chambers of the heart (ventricles), causing the atria and ventricles to contract and pump blood as they pass. In atrial fibrillation, parts of the atria outside of the sinoatrial node also release these signals. This results in two problems. First, the atria receive so many signals that they do not have time to fully contract. Second, the ventricles, which can only receive one signal at a time, beat irregularly and out of rhythm with the atria.  There are three types of atrial fibrillation:   Paroxysmal Paroxysmal atrial fibrillation starts suddenly and stops on its own within a week.   Persistent Persistent atrial fibrillation lasts for more than a week. It may stop on its own or with treatment.   Permanent Permanent atrial fibrillation does not go away. Episodes of atrial fibrillation may lead to permanent atrial fibrillation.  Atrial fibrillation can prevent your heart from pumping blood normally. It increases your risk of stroke and can lead to heart failure.  CAUSES   Heart conditions, including  a heart attack, heart failure, coronary artery disease, and heart valve conditions.   Inflammation of the sac that surrounds the heart (pericarditis).   Blockage of an artery in the lungs (pulmonary embolism).   Pneumonia or other infections.   Chronic lung disease.   Thyroid problems, especially if the thyroid is overactive (hyperthyroidism).   Caffeine, excessive alcohol use, and use of some illegal drugs.   Use of some medications,  including certain decongestants and diet pills.   Heart surgery.   Birth defects.  Sometimes, no cause can be found. When this happens, the atrial fibrillation is called lone atrial fibrillation. The risk of complications from atrial fibrillation increases if you have lone atrial fibrillation and you are age 48 years or older. RISK FACTORS  Heart failure.  Coronary artery disease  Diabetes mellitus.   High blood pressure (hypertension).   Obesity.   Other arrhythmias.   Increased age. SYMPTOMS   A feeling that your heart is beating rapidly or irregularly.   A feeling of discomfort or pain in your chest.   Shortness of breath.   Sudden lightheadedness or weakness.   Getting tired easily when exercising.   Urinating more often than normal (mainly when atrial fibrillation first begins).  In paroxysmal atrial fibrillation, symptoms may start and suddenly stop. DIAGNOSIS  Your caregiver may be able to detect atrial fibrillation when taking your pulse. Usually, testing is needed to diagnosis atrial fibrillation. Tests may include:   Electrocardiography. During this test, the electrical impulses of your heart are recorded while you are lying down.   Echocardiography. During echocardiography, sound waves are used to evaluate how blood flows through your heart.   Stress test. There is more than one type of stress test. If a stress test is needed, ask your caregiver about which type is best for you.   Chest X-ray exam.   Blood tests.   Computed tomography (CT).  TREATMENT   Treating any underlying conditions. For example, if you have an overactive thyroid, treating the condition may correct atrial fibrillation.   Medication. Medications may be given to control a rapid heart rate or to prevent blood clots, heart failure, or a stroke.   Procedure to correct the rhythm of the heart:  Electrical cardioversion. During electrical cardioversion, a  controlled, low-energy shock is delivered to the heart through your skin. If you have chest pain, very low pressure blood pressure, or sudden heart failure, this procedure may need to be done as an emergency.  Catheter ablation. During this procedure, heart tissues that send the signals that cause atrial fibrillation are destroyed.  Maze or minimaze procedure. During this surgery, thin lines of heart tissue that carry the abnormal signals are destroyed. The maze procedure is an open-heart surgery. The minimaze procedure is a minimally invasive surgery. This means that small cuts are made to access the heart instead of a large opening.  Pulmonary venous isolation. During this surgery, tissue around the veins that carry blood from the lungs (pulmonary veins) is destroyed. This tissue is thought to carry the abnormal signals. HOME CARE INSTRUCTIONS   Take medications as directed by your caregiver.  Only take medications that your caregiver approves. Some medications can make atrial fibrillation worse or recur.  If blood thinners were prescribed by your caregiver, take them exactly as directed. Too much can cause bleeding. Too little and you will not have the needed protection against stroke and other problems.  Perform blood tests at home if directed by your  caregiver.  Perform blood tests exactly as directed.   Quit smoking if you smoke.   Do not drink alcohol.   Do not drink caffeinated beverages such as coffee, soda, and some teas. You may drink decaffeinated coffee, soda, or tea.   Maintain a healthy weight. Do not use diet pills unless your caregiver approves. They may make heart problems worse.   Follow diet instructions as directed by your caregiver.   Exercise regularly as directed by your caregiver.   Keep all follow-up appointments. PREVENTION  The following substances can cause atrial fibrillation to recur:   Caffeinated beverages.   Alcohol.   Certain  medications, especially those used for breathing problems.   Certain herbs and herbal medications, such as those containing ephedra or ginseng.  Illegal drugs such as cocaine and amphetamines. Sometimes medications are given to prevent atrial fibrillation from recurring. Proper treatment of any underlying condition is also important in helping prevent recurrence.  SEEK MEDICAL CARE IF:  You notice a change in the rate, rhythm, or strength of your heartbeat.   You suddenly begin urinating more frequently.   You tire more easily when exerting yourself or exercising.  SEEK IMMEDIATE MEDICAL CARE IF:   You develop chest pain, abdominal pain, sweating, or weakness.  You feel sick to your stomach (nauseous).  You develop shortness of breath.  You suddenly develop swollen feet and ankles.  You feel dizzy.  You face or limbs feel numb or weak.  There is a change in your vision or speech. MAKE SURE YOU:   Understand these instructions.  Will watch your condition.  Will get help right away if you are not doing well or get worse. Document Released: 11/30/2005 Document Revised: 03/27/2013 Document Reviewed: 01/10/2013 [facility] Patient Information 2014 [place].  How Much is Too Much Alcohol? Drinking too much alcohol can cause injury, accidents, and health problems. These types of problems can include:   Car crashes.  Falls.  Family fighting (domestic violence).  Drowning.  Fights.  Injuries.  Burns.  Damage to certain organs.  Having a baby with birth defects. ONE DRINK CAN BE TOO MUCH WHEN YOU ARE:  Working.  Pregnant or breastfeeding.  Taking medicines. Ask your doctor.  Driving or planning to drive. WHAT IS A STANDARD DRINK?   1 regular beer (12 ounces or 360 milliliters).  1 glass of wine (5 ounces or 150 milliliters).  1 shot of liquor (1.5 ounces or 45 milliliters). BLOOD ALCOHOL LEVELS   .00 A person is sober.  Marland Kitchen03 A person has no  trouble keeping balance, talking, or seeing right, but a "buzz" may be felt.  Marland Kitchen05 A person feels "buzzed" and relaxed.  Marland Kitchen08 or .10  A person is drunk. He or she has trouble talking, seeing right, and keeping his or her balance.  .15 A person loses body control and may pass out (blackout).  .20 A person has trouble walking (staggering) and throws up (vomits).  .30 A person will pass out (unconscious).  .40+ A person will be in a coma. Death is possible. If you or someone you know has a drinking problem, get help from a doctor.  Document Released: 09/26/2009 Document Revised: 02/22/2012 Document Reviewed: 09/26/2009 [facility] Patient Information 2014 [place]. Finding Treatment for Alcohol and Drug Addiction It can be hard to find the right place to get professional treatment. Here are some important things to consider:  There are different types of treatment to choose from.  Some programs are  live-in (residential) while others are not (outpatient). Sometimes a combination is offered.  No single type of program is right for everyone.  Most treatment programs involve a combination of education, counseling, and a 12-step, spiritually-based approach.  There are non-spiritually based programs (not 12-step).  Some treatment programs are government sponsored. They are geared for patients without private insurance.  Treatment programs can vary in many respects such as:  Cost and types of insurance accepted.  Types of on-site medical services offered.  Length of stay, setting, and size.  Overall philosophy of treatment. A person may need specialized treatment or have needs not addressed by all programs. For example, adolescents need treatment appropriate for their age. Other people have secondary disorders that must be managed as well. Secondary conditions can include mental illness, such as depression or diabetes. Often, a period of detoxification from alcohol or drugs is  needed. This requires medical supervision and not all programs offer this. THINGS TO CONSIDER WHEN SELECTING A TREATMENT PROGRAM   Is the program certified by the appropriate government agency? Even private programs must be certified and employ certified professionals.  Does the program accept your insurance? If not, can a payment plan be set up?  Is the facility clean, organized, and well run? Do they allow you to speak with graduates who can share their treatment experience with you? Can you tour the facility? Can you meet with staff?  Does the program meet the full range of individual needs?  Does the treatment program address sexual orientation and physical disabilities? Do they provide age, gender, and culturally appropriate treatment services?  Is treatment available in languages other than English?  Is long-term aftercare support or guidance encouraged and provided?  Is assessment of an individual's treatment plan ongoing to ensure it meets changing needs?  Does the program use strategies to encourage reluctant patients to remain in treatment long enough to increase the likelihood of success?  Does the program offer counseling (individual or group) and other behavioral therapies?  Does the program offer medicine as part of the treatment regimen, if needed?  Is there ongoing monitoring of possible relapse? Is there a defined relapse prevention program? Are services or referrals offered to family members to ensure they understand addiction and the recovery process? This would help them support the recovering individual.  Are 12-step meetings held at the center or is transport available for patients to attend outside meetings? In countries outside of the [place] and [place], [employer] for contact information for services in your area. Document Released: 10/29/2005 Document Revised: 02/22/2012 Document Reviewed: 05/10/2008 [facility] Patient Information 2014 [place]. Alcohol Intoxication Alcohol intoxication occurs when the amount of alcohol that a person has consumed impairs his or her ability to mentally and physically function. Alcohol directly impairs the normal chemical activity of the brain. Drinking large amounts of alcohol can lead to changes in mental function and behavior, and it can cause many physical effects that can be harmful.  Alcohol intoxication can range in severity from mild to very severe. Various factors can affect the level of intoxication that occurs, such as the person's age, gender, weight, frequency of alcohol consumption, and the presence of other medical conditions (such as diabetes, seizures, or heart conditions). Dangerous levels of alcohol intoxication may occur when people drink large amounts of alcohol in a [place] period (binge drinking). Alcohol can also be especially dangerous when combined with certain prescription medicines or "recreational" drugs. SIGNS AND SYMPTOMS Some common signs and  symptoms of mild alcohol intoxication include:  Loss of coordination.  Changes in mood and behavior.  Impaired judgment.  Slurred speech. As alcohol intoxication progresses to more severe levels, other signs and symptoms will appear. These may include:  Vomiting.  Confusion and impaired memory.  Slowed breathing.  Seizures.  Loss of consciousness. DIAGNOSIS  Your health care provider will take a medical history and perform a physical exam. You will be asked about the amount and type of alcohol you have consumed. Blood tests will be done to measure the concentration of alcohol in your blood. In many places, your blood alcohol level must be lower than 80 mg/dL (0.08%) to legally drive. However, many dangerous effects of alcohol can occur at much lower levels.  TREATMENT  People with alcohol intoxication often do not require treatment. Most of the effects of alcohol intoxication are temporary, and they go away as the alcohol  naturally leaves the body. Your health care provider will monitor your condition until you are stable enough to go home. Fluids are sometimes given through an IV access tube to help prevent dehydration.  HOME CARE INSTRUCTIONS  Do not drive after drinking alcohol.  Stay hydrated. Drink enough water and fluids to keep your urine clear or pale yellow. Avoid caffeine.   Only take over-the-counter or prescription medicines as directed by your health care provider.  SEEK MEDICAL CARE IF:   You have persistent vomiting.   You do not feel better after a few days.  You have frequent alcohol intoxication. Your health care provider can help determine if you should see a substance use treatment counselor. SEEK IMMEDIATE MEDICAL CARE IF:   You become shaky or tremble when you try to stop drinking.   You shake uncontrollably (seizure).   You throw up (vomit) blood. This may be bright red or may look like black coffee grounds.   You have blood in your stool. This may be bright red or may appear as a black, tarry, bad smelling stool.   You become lightheaded or faint.  MAKE SURE YOU:   Understand these instructions.  Will watch your condition.  Will get help right away if you are not doing well or get worse. Document Released: 09/09/2005 Document Revised: 08/02/2013 Document Reviewed: 05/05/2013 [facility] Patient Information 2014 [place]. Hypertension As your heart beats, it forces blood through your arteries. This force is your blood pressure. If the pressure is too high, it is called hypertension (HTN) or high blood pressure. HTN is dangerous because you may have it and not know it. High blood pressure may mean that your heart has to work harder to pump blood. Your arteries may be narrow or stiff. The extra work puts you at risk for heart disease, stroke, and other problems.  Blood pressure consists of two numbers, a higher number over a lower, 110/72, for example. It is stated  as "110 over 72." The ideal is below 120 for the top number (systolic) and under 80 for the bottom (diastolic). Write down your blood pressure today. You should pay close attention to your blood pressure if you have certain conditions such as:  Heart failure.  Prior heart attack.  Diabetes  Chronic kidney disease.  Prior stroke.  Multiple risk factors for heart disease. To see if you have HTN, your blood pressure should be measured while you are seated with your arm held at the level of the heart. It should be measured at least twice. A one-time elevated blood pressure reading (  especially in the Emergency Department) does not mean that you need treatment. There may be conditions in which the blood pressure is different between your right and left arms. It is important to see your caregiver soon for a recheck. Most people have essential hypertension which means that there is not a specific cause. This type of high blood pressure may be lowered by changing lifestyle factors such as:  Stress.  Smoking.  Lack of exercise.  Excessive weight.  Drug/tobacco/alcohol use.  Eating less salt. Most people do not have symptoms from high blood pressure until it has caused damage to the body. Effective treatment can often prevent, delay or reduce that damage. TREATMENT  When a cause has been identified, treatment for high blood pressure is directed at the cause. There are a large number of medications to treat HTN. These fall into several categories, and your caregiver will help you select the medicines that are best for you. Medications may have side effects. You should review side effects with your caregiver. If your blood pressure stays high after you have made lifestyle changes or started on medicines,   Your medication(s) may need to be changed.  Other problems may need to be addressed.  Be certain you understand your prescriptions, and know how and when to take your medicine.  Be sure  to follow up with your caregiver within the time frame advised (usually within two weeks) to have your blood pressure rechecked and to review your medications.  If you are taking more than one medicine to lower your blood pressure, make sure you know how and at what times they should be taken. Taking two medicines at the same time can result in blood pressure that is too low. SEEK IMMEDIATE MEDICAL CARE IF:  You develop a severe headache, blurred or changing vision, or confusion.  You have unusual weakness or numbness, or a faint feeling.  You have severe chest or abdominal pain, vomiting, or breathing problems. MAKE SURE YOU:   Understand these instructions.  Will watch your condition.  Will get help right away if you are not doing well or get worse. Document Released: 11/30/2005 Document Revised: 02/22/2012 Document Reviewed: 07/20/2008 [facility] Patient Information 2014 [place].

## 2014-02-15 NOTE — Discharge Summary (Signed)
Physician Discharge Summary  Anthony Mcclure WPY:[id number] DOB: [birth year] DOA: 02/14/2014  PCP: Default, Provider, MD  Admit date: 02/14/2014 Discharge date: 02/15/2014  Time spent: 30 minutes  Recommendations for Outpatient Follow-up:  1. Follow up with the [facility] as scheduled 2. Follow up using the information given to you by the social worker regarding outpatient alcohol treatment programs  Discharge Diagnoses:  Principal Problem:   Paroxysmal Atrial Flutter- maintaining NSR Active Problems:   Alcohol abuse   Alcohol withdrawal seizure (history of)   Fluid volume depletion-resolved   HTN (hypertension)   Noncompliance with medication treatment due to intermittent use of medication   Dizziness due to dehydration and atrial flutter with RVR   Discharge Condition: stable  Diet recommendation: Heart Healthy  Filed Weights   02/14/14 1456 02/15/14 0400  Weight: 192 lb (87.091 kg) 191 lb 12.8 oz (87 kg)    History of present illness:  63 year old male patient with prior history of paroxysmal atrial fib flutter, hypertension, ongoing alcohol abuse and apparent alcohol withdrawal seizures. Presented to the emergency department after expressing multiple episodes of presyncope and blacking out. He reported to the admitting physician that he went to work as usual on 02/14/2014 and was trying to move furniture but felt like he was dizzy. He sat down and had mild chest pain. Later he walked to the bus stop and had a similar sensation and decided to present to the emergency department. According to the emergency department records the patient only had presyncope-type symptoms. He he went to the ground but did not fall and did not lose consciousness. He also endorsed shortness of breath. Upon presentation to the emergency department the patient was hypertensive with a heart rate 149 beats per minute.  The patient states to the admitting physician that he  drinks alcohol very heavily this includes large-volume beer i.e. 40 is bottles as well as sharing a fifth of vodka with friends. He drinks wine as well. States he is on disability but has not followed up with the primary care physician because  "last doctor died and I haven't to found a new one". He primarily obtains his medications from visits to emergency departments and urgent care clinics.  Cardiology was consulted and the patient was admitted to the step down unit.  Hospital Course:  Recurrent paroxysmal atrial flutter with RVR- 3:1 block -Concerns for possible tachycardia mediated cardiomyopathy but echocardiogram this admission revealed preserved LV function -Patient quickly converted to sinus rhythm after initiation of IV Cardizem infusion. Patient subsequently was transitioned to oral calcium channel blockers and beta blockers at recommendation of cardiologist -Can discharge patient will be given Verapamil 120 mg 3 times a day and metoprolol 25 mg (two tabs in am and one tab at bedtime) both of which are available for $4 at either the [facility] or [facility] pharmacy.  Hypertension/chest pain -Blood pressure well controlled after resumption of antihypertensive medications -See above regarding medications used for rate control -Echocardiogram this admission with preserved LV function with inferior basal hypokinesis without LVH. It is likely this focal hypokinesis was related to recent tachycardia. -EKG was nonischemic and cardiac enzymes were negative times one collection-has not had any recurrence of previous chest discomfort  Alcoholism -Patient requested information regarding availability of outpatient long-term treatment programs -Social worker was consulted and information given to the patient for him to pursue at time of discharge -Patient did not experience any significant alcohol withdrawal symptoms during this hospitalization and did well on  the CIWA protocol  Noncompliance with  recommended medical therapy -Case manager was consulted and has arranged followup appointment after discharge with the [facility] -Patient has prescriptions that should be affordable at [facility]  Hypokalemia -Potassium was 3.5 on date of discharge therefore was given a one-time dose of 40 mEq of potassium  Procedures: 2-D echocardiogram - Left ventricle: Inferobasal hypokinesis The cavity size was normal. Wall thickness was normal. Systolic function was normal. The estimated ejection fraction was in the range of 50% to 55%. - Left atrium: The atrium was mildly dilated. - Right atrium: The atrium was mildly dilated. - Atrial septum: No defect or patent foramen ovale was identified.  Consultations:  Cardiology  Discharge Exam: Filed Vitals:   02/15/14 0930  BP: 138/91  Pulse: 81  Temp: 98.1 F (36.7 C)  Resp: 18   General: No acute respiratory distress Lungs: Clear to auscultation bilaterally without wheezes or crackles, RA Cardiovascular: Regular rate and rhythm without murmur gallop or rub normal S1 and S2, no peripheral edema or JVD Abdomen: Nontender, nondistended, soft, bowel sounds positive, no rebound, no ascites, no appreciable mass Musculoskeletal: No significant cyanosis, clubbing of bilateral lower extremities Neurological: Alert and oriented x 3, moves all extremities x 4 without focal neurological deficits, CN 2-12 intact   Discharge Instructions      Discharge Orders   Future Appointments Provider Department Dept Phone   02/19/2014 4:30 PM [facility] Covering Provider [place]   Future Orders Complete By Expires   Call MD for:  difficulty breathing, headache or visual disturbances  As directed    Call MD for:  extreme fatigue  As directed    Call MD for:  persistant dizziness or light-headedness  As directed    Call MD for:  temperature >100.4  As directed    Diet - low sodium heart  healthy  As directed    Increase activity slowly  As directed        Medication List         metoprolol tartrate 25 MG tablet  Commonly known as:  LOPRESSOR  Take TWO 25 mg tabs at breakfast and ONE 25 mg tab at bedtime     verapamil 120 MG tablet  Commonly known as:  CALAN  Take 1 tablet (120 mg total) by mouth 3 (three) times daily.       No Known Allergies Follow-up Information   Follow up with [facility]    . (appointment Monday, March 9th, @ 4:30 p.m.)    Contact information:   [place] [id number] [phone]       The results of significant diagnostics from this hospitalization (including imaging, microbiology, ancillary and laboratory) are listed below for reference.    Significant Diagnostic Studies: Dg Chest Port 1 View  02/14/2014   CLINICAL DATA:  Shortness of breath.  Hypertension.  EXAM: PORTABLE CHEST - 1 VIEW  COMPARISON:  Single view of the chest 05/06/2013.  FINDINGS: There is cardiomegaly without edema. No pneumothorax or pleural fluid. No focal bony abnormality.  IMPRESSION: Cardiomegaly without acute disease.   Electronically Signed   By: Inge Rise M.D.   On: 02/14/2014 12:12    Microbiology: Recent Results (from the past 240 hour(s))  MRSA PCR SCREENING     Status: None   Collection Time    02/14/14  3:32 PM      Result Value Ref  Range Status   MRSA by PCR NEGATIVE  NEGATIVE Final   Comment:            The GeneXpert MRSA Assay (FDA     approved for NASAL specimens     only), is one component of a     comprehensive MRSA colonization     surveillance program. It is not     intended to diagnose MRSA     infection nor to guide or     monitor treatment for     MRSA infections.     Labs: Basic Metabolic Panel:  Recent Labs Lab 02/14/14 1145 02/14/14 1540 02/15/14 0232  NA 142 142 140  K 5.1 4.2 3.5*  CL 102 102 101  CO2 23 26 26   GLUCOSE 91 122* 90  BUN 9 8 7   CREATININE  0.86 0.81  0.79 0.81  CALCIUM 8.9 8.9 8.4  MG  --   --  1.5  PHOS  --   --  3.2   Liver Function Tests:  Recent Labs Lab 02/15/14 0232  AST 88*  ALT 69*  ALKPHOS 56  BILITOT 1.5*  PROT 6.5  ALBUMIN 3.1*   No results found for this basename: LIPASE, AMYLASE,  in the last 168 hours No results found for this basename: AMMONIA,  in the last 168 hours CBC:  Recent Labs Lab 02/14/14 1145 02/14/14 1540 02/15/14 0232  WBC 7.2 6.8 5.6  NEUTROABS 4.6  --   --   HGB 15.7 15.3 13.8  HCT 44.9 44.3 40.0  MCV 81.2 81.1 80.3  PLT 152 131* 134*   Cardiac Enzymes:  Recent Labs Lab 02/14/14 1145  TROPONINI <0.30   BNP: BNP (last 3 results)  Recent Labs  02/14/14 1145  PROBNP 1322.0*   CBG:  Recent Labs Lab 02/15/14 0933  GLUCAP 150*       Signed:  ELLIS,ALLISON L. ANP [facility] 02/15/2014, 11:37 AM   I have examined the patient, reviewed the chart and modified the above note which I agree with.   Dallana Mavity,MD Pager # [email or web address] 02/15/2014, 2:57 PM

## 2014-02-15 NOTE — Care Management Note (Signed)
    Page 1 of 1   02/15/2014     12:40:58 PM   CARE MANAGEMENT NOTE 02/15/2014  Patient:  Anthony Mcclure, Anthony Mcclure   Account Number:  [id number]  Date Initiated:  02/15/2014  Documentation initiated by:  Joseluis Alessio  Subjective/Objective Assessment:   Pt is living with a roommate, states Lacretia Nicks @ [employer] manages his finances/pays bills, etc.,  has no PCP.     DC Planning Services  CM consult  [facility]      Per UR Regulation:  Reviewed for med. necessity/level of care/duration of stay  Comments:  02/15/14 [place] RN MSN BSN CCM Has no PCP, requests appt @ [facility].  TC to clinic, requested appt and return call before d/c today as pt has no home phone. 1025 Received call with appt for Monday, 3/9 @ 4:30 p.m. Information provided to pt.

## 2014-02-15 NOTE — Progress Notes (Signed)
Clinical Social Work Department BRIEF PSYCHOSOCIAL ASSESSMENT 02/15/2014  Patient:  Anthony Mcclure, Anthony Mcclure     Account Number:  [id number]     Admit date:  02/14/2014  Clinical Social Worker:  Freeman Caldron  Date/Time:  02/15/2014 11:55 AM  Referred by:  Physician  Date Referred:  02/15/2014 Referred for  Substance Abuse   Other Referral:   Interview type:  Patient Other interview type:    PSYCHOSOCIAL DATA Living Status:  FRIEND(S) Admitted from facility:   Level of care:   Primary support name:  Barbaraann Cao [phone]) Primary support relationship to patient:  FRIEND Degree of support available:   Good--pt lives with friends and states he has one friend/roommate in particular, [employer], that provides support to him and has been a good influence in his life.    CURRENT CONCERNS Current Concerns  Substance Abuse   Other Concerns:    SOCIAL WORK ASSESSMENT / PLAN CSW and pt engaged in extended conversation about pt's substance use history. Pt states [place] and that he was exposed to alcohol at a young age. Pt states his assosciates would make their own liquor, and CSW and pt talked about the negative health effects that unregulated homemade liquor has on the body after extended use. Pt states his friend Barbaraann Cao is a support to him, and that he realizes he needs to stop drinking or his health will continue to suffer. Pt interested in longterm outpatient substance use resources, and CSW provided a list of inpatient and outpatient referrals. CSW also provided pt with schedule for AA meetings in [place]. CSW confirmed with pt that he is able to read the schedule and resource list. Pt states he had tried to go to [facility] in the past, but that his medical care needs were too extensive and they would not take him. Pt states he was active in a program that allowed him to participate in alcohol cessation programs for "4 hours a day," and CSW  believes he was describing [facility]. Pt has a thick accent and it can be difficult to understand pt's speech. 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 Pt states he actually enjoyed the programs, and he accepted literature provided by CSW and states he will get in touch with the program he was active with in the past and plans to stop drinking. CSW asked if pt has any other needs/concerns, and pt states he does not. RN expressed concern for how pt will get home, as pt informed RN that he left his bus pass in his wallet and this is at home. CSW asked pt if CSW could call the phone number listed in pt's chart for [facility], but pt refused. CSW asked if pt needs any assistance getting home, and he states he does not. Pt states he wants to walk. CSW attempted to [facility] the address we have in the chart for pt to determine the length of his walk home, but [employer] does not recognize the address. CSW asked pt his address, and he confirmed it is [place] asked how far this is the from the hospital, and pt responds "not far, it is off [address]." CSW attempted to [place] directions from the hospital to [employer] to no avail. CSW voiced concern for pt walking home again, but pt adamant that he has a  jacket and he wants to walk. Pt thanked CSW for concern. RN going over discharge instructions with pt as CSW left room.   Assessment/plan status:  No Further Intervention Required Other assessment/ plan:   Information/referral to community resources:   CSW provided inpatient and outpatient alcohol cessation resources, in addition to [employer] meeting schedule. CSW offered to assist with transportation for pt, who forgot bus card in his wallet, but pt adamant that he wants to walk.    PATIENT'S/FAMILY'S RESPONSE TO PLAN OF CARE: Good--pt friendly and actively engaged in conversation with CSW. CSW utilized  motivational interviewing techniques, active listening, and solution-focused therapeutic interventions when working with pt. Pt provided with extensive list of outpatient/inpatient programs, and AA meeting schedule for [place]. Pt confirmed he can read schedule and resources for outpatient/inpatient programs. Pt thanked CSW for expressing concern for him, and is motivated to stop drinking. Pt also appreciative of offer to help with transportation home, but pt refused stating he wants to walk. Pt is discharging today, so CSW signing off.       Ky Barban, MSW, [facility] Clinical Social Worker [phone]
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
## 2014-02-19 ENCOUNTER — Ambulatory Visit: Payer: Medicare Other | Attending: Internal Medicine | Admitting: Internal Medicine

## 2014-02-19 ENCOUNTER — Encounter: Payer: Self-pay | Admitting: Internal Medicine

## 2014-02-19 VITALS — BP 149/98 | HR 81 | Temp 98.7°F | Resp 14 | Ht 75.0 in | Wt 201.2 lb

## 2014-02-19 DIAGNOSIS — B1920 Unspecified viral hepatitis C without hepatic coma: Secondary | ICD-10-CM

## 2014-02-19 DIAGNOSIS — M79609 Pain in unspecified limb: Secondary | ICD-10-CM | POA: Insufficient documentation

## 2014-02-19 DIAGNOSIS — I4892 Unspecified atrial flutter: Secondary | ICD-10-CM | POA: Insufficient documentation

## 2014-02-19 MED ORDER — VERAPAMIL HCL 120 MG PO TABS: 120.0000 mg | ORAL_TABLET | Freq: Three times a day (TID) | ORAL | Status: DC

## 2014-02-19 MED ORDER — METOPROLOL TARTRATE 25 MG PO TABS: ORAL_TABLET | ORAL | Status: DC

## 2014-02-19 MED ORDER — MELOXICAM 15 MG PO TABS: 15.0000 mg | ORAL_TABLET | Freq: Every day | ORAL | Status: DC

## 2014-02-19 NOTE — Progress Notes (Signed)
Patient is here for a hospital follow up for atrial flutter and to establish care. Patient suffers from hypertension. BP today is 149/98, pulse of 78. No complaints of today. Patient is a drinker, but states that he has not been drinking since his hospital visit. Has a family history of hypertension and diabetes. No pain today.
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
## 2014-02-20 LAB — URIC ACID: Uric Acid, Serum: 7.4 mg/dL (ref 4.0–7.8)

## 2014-02-21 LAB — HEPATITIS C RNA QUANTITATIVE
HCV Quantitative Log: 6.35 {Log} — ABNORMAL HIGH (ref <1.18)
HCV Quantitative: 2220993 IU/mL — ABNORMAL HIGH (ref <15)

## 2014-02-22 LAB — HEPATITIS C GENOTYPE

## 2014-02-22 NOTE — Addendum Note (Signed)
Addended by: Allyson Sabal MD, Ascencion Dike on: 02/22/2014 02:38 PM   Modules accepted: Orders

## 2014-02-23 ENCOUNTER — Telehealth: Payer: Self-pay | Admitting: Emergency Medicine

## 2014-02-23 NOTE — Telephone Encounter (Signed)
Message copied by Ricci Barker on Fri Feb 23, 2014 10:40 AM ------      Message from: Allyson Sabal MD, Ascencion Dike      Created: Thu Feb 22, 2014  2:40 PM       Please notify patient that hepatitis C confirmatory test is positive. Patient is being referred to gastroenterology for treatment. Please direct patient to speak to Alinda Sierras, so that he can set this appointment. Thank you ------

## 2014-02-23 NOTE — Telephone Encounter (Signed)
Left message with contact number Joy, for pt to call clinic

## 2014-04-02 ENCOUNTER — Ambulatory Visit: Payer: Self-pay | Admitting: Internal Medicine

## 2014-04-20 ENCOUNTER — Encounter: Payer: Self-pay | Admitting: Internal Medicine

## 2014-04-20 ENCOUNTER — Ambulatory Visit: Payer: Medicare Other | Attending: Internal Medicine | Admitting: Internal Medicine

## 2014-04-20 VITALS — BP 142/97 | HR 70 | Temp 98.4°F | Resp 16 | Wt 203.8 lb

## 2014-04-20 DIAGNOSIS — B1920 Unspecified viral hepatitis C without hepatic coma: Secondary | ICD-10-CM | POA: Insufficient documentation

## 2014-04-20 DIAGNOSIS — I4892 Unspecified atrial flutter: Secondary | ICD-10-CM

## 2014-04-20 DIAGNOSIS — Z9119 Patient's noncompliance with other medical treatment and regimen: Secondary | ICD-10-CM | POA: Insufficient documentation

## 2014-04-20 DIAGNOSIS — Z91199 Patient's noncompliance with other medical treatment and regimen due to unspecified reason: Secondary | ICD-10-CM | POA: Insufficient documentation

## 2014-04-20 DIAGNOSIS — I10 Essential (primary) hypertension: Secondary | ICD-10-CM | POA: Insufficient documentation

## 2014-04-20 DIAGNOSIS — Z79899 Other long term (current) drug therapy: Secondary | ICD-10-CM | POA: Insufficient documentation

## 2014-04-20 DIAGNOSIS — I4891 Unspecified atrial fibrillation: Secondary | ICD-10-CM | POA: Insufficient documentation

## 2014-04-20 DIAGNOSIS — Z139 Encounter for screening, unspecified: Secondary | ICD-10-CM

## 2014-04-20 DIAGNOSIS — I509 Heart failure, unspecified: Secondary | ICD-10-CM | POA: Insufficient documentation

## 2014-04-20 DIAGNOSIS — F1010 Alcohol abuse, uncomplicated: Secondary | ICD-10-CM | POA: Insufficient documentation

## 2014-04-20 MED ORDER — METOPROLOL TARTRATE 25 MG PO TABS: ORAL_TABLET | ORAL | Status: DC

## 2014-04-20 MED ORDER — VERAPAMIL HCL 120 MG PO TABS: 120.0000 mg | ORAL_TABLET | Freq: Three times a day (TID) | ORAL | Status: DC

## 2014-04-20 MED ORDER — MELOXICAM 15 MG PO TABS: 15.0000 mg | ORAL_TABLET | Freq: Every day | ORAL | Status: DC

## 2014-04-20 NOTE — Progress Notes (Signed)
MRN: [id number] Name: Anthony Mcclure  Sex: male Age: 63 y.o. DOB: 27-Nov-1951  Allergies: Review of patient's allergies indicates no known allergies.  Chief Complaint  Patient presents with  . Follow-up    HPI: Patient is 63 y.o. male who was seen by Dr. Allyson Sabal, history of hepatitis C, paroxysmal A. fib used to follow with the cardiologist currently on Lopressor and metoprolol his blood pressure is controlled, patient requesting refill on his medications, patient was also referred to ID in the past for hepatitis C but has not been able to see a specialist needs another referral.   Past Medical History  Diagnosis Date  . CHF (congestive heart failure)   . Alcohol abuse   . Seizure   . Noncompliance with medication treatment due to intermittent use of medication 02/02/2012  . HTN (hypertension) 02/02/2012  . Alcohol withdrawal seizure 02/02/2012  . Hypertension   . Dysrhythmia     paroxismal atrial fib  . Paroxysmal Atrial Flutter 02/01/2012  . Hepatitis C 02/02/2012    Unsure if he was ever diagnosed with Hep C  . Hepatitis C     Past Surgical History  Procedure Laterality Date  . Forearm reconstruction        Medication List       This list is accurate as of: 04/20/14 12:30 PM.  Always use your most recent med list.               meloxicam 15 MG tablet  Commonly known as:  MOBIC  Take 1 tablet (15 mg total) by mouth daily.     metoprolol tartrate 25 MG tablet  Commonly known as:  LOPRESSOR  Take TWO 25 mg tabs at breakfast and ONE 25 mg tab at bedtime     verapamil 120 MG tablet  Commonly known as:  CALAN  Take 1 tablet (120 mg total) by mouth 3 (three) times daily.        Meds ordered this encounter  Medications  . meloxicam (MOBIC) 15 MG tablet    Sig: Take 1 tablet (15 mg total) by mouth daily.    Dispense:  30 tablet    Refill:  3  . metoprolol tartrate (LOPRESSOR) 25 MG tablet    Sig: Take TWO 25 mg tabs at breakfast and ONE 25 mg tab at bedtime   Dispense:  90 tablet    Refill:  3    Order Specific Question:  Supervising Provider    Answer:  ELLIS, ALLISON L [[id number]]  . verapamil (CALAN) 120 MG tablet    Sig: Take 1 tablet (120 mg total) by mouth 3 (three) times daily.    Dispense:  90 tablet    Refill:  3    Order Specific Question:  Supervising Provider    Answer:  Samella Parr [[id number]]    Immunization History  Administered Date(s) Administered  . Influenza Split 02/03/2012  . Pneumococcal Polysaccharide-23 02/03/2012  . Tdap 01/11/2014    Family History  Problem Relation Age of Onset  . Heart disease Father   . Alcohol abuse Father   . Alcohol abuse Brother   . Diabetes type II Mother     History  Substance Use Topics  . Smoking status: Never Smoker   . Smokeless tobacco: Never Used  . Alcohol Use: Yes     Comment: statrts drinking everyday at 5am drinks about 2 fiths been drinking for about 10 years    Review of Systems   As  noted in HPI  Filed Vitals:   04/20/14 1158  BP: 142/97  Pulse: 70  Temp: 98.4 F (36.9 C)  Resp: 16    Physical Exam  Physical Exam  Constitutional: No distress.  Eyes: EOM are normal. Pupils are equal, round, and reactive to light.  Cardiovascular: Normal rate and regular rhythm.   Pulmonary/Chest: Breath sounds normal. No respiratory distress. He has no wheezes. He has no rales.  Musculoskeletal: He exhibits no edema.    CBC    Component Value Date/Time   WBC 5.6 02/15/2014 0232   RBC 4.98 02/15/2014 0232   HGB 13.8 02/15/2014 0232   HCT 40.0 02/15/2014 0232   PLT 134* 02/15/2014 0232   MCV 80.3 02/15/2014 0232   LYMPHSABS 1.9 02/14/2014 1145   MONOABS 0.6 02/14/2014 1145   EOSABS 0.1 02/14/2014 1145   BASOSABS 0.0 02/14/2014 1145    CMP     Component Value Date/Time   NA 140 02/15/2014 0232   K 3.5* 02/15/2014 0232   CL 101 02/15/2014 0232   CO2 26 02/15/2014 0232   GLUCOSE 90 02/15/2014 0232   BUN 7 02/15/2014 0232   CREATININE 0.81 02/15/2014 0232   CALCIUM 8.4 02/15/2014 0232    PROT 6.5 02/15/2014 0232   ALBUMIN 3.1* 02/15/2014 0232   AST 88* 02/15/2014 0232   ALT 69* 02/15/2014 0232   ALKPHOS 56 02/15/2014 0232   BILITOT 1.5* 02/15/2014 0232   GFRNONAA >90 02/15/2014 0232   GFRAA >90 02/15/2014 0232    Lab Results  Component Value Date/Time   CHOL 210* 02/15/2013  5:35 PM    No components found with this basename: hga1c    Lab Results  Component Value Date/Time   AST 88* 02/15/2014  2:32 AM    Assessment and Plan  Atrial flutter/Essential hypertension, benign - Plan: Rate is controlled Continue with metoprolol tartrate (LOPRESSOR) 25 MG tablet, verapamil (CALAN) 120 MG tablet  Hepatitis C infection - Plan: Ambulatory referral to Infectious Disease   Screening - Plan: COMPLETE METABOLIC PANEL WITH GFR  Return in about 3 months (around 07/21/2014) for hypertension.  [name], MD

## 2014-04-20 NOTE — Progress Notes (Signed)
Patient here for follow up on his Afluttler/fib and HTN Needs medications refilled

## 2014-05-28 ENCOUNTER — Ambulatory Visit: Payer: Self-pay | Admitting: Internal Medicine

## 2014-06-13 ENCOUNTER — Other Ambulatory Visit: Payer: Self-pay | Admitting: Emergency Medicine

## 2014-06-13 DIAGNOSIS — I484 Atypical atrial flutter: Secondary | ICD-10-CM

## 2014-06-13 MED ORDER — VERAPAMIL HCL 120 MG PO TABS: 120.0000 mg | ORAL_TABLET | Freq: Three times a day (TID) | ORAL | Status: DC

## 2014-08-15 ENCOUNTER — Ambulatory Visit: Payer: Self-pay | Admitting: Internal Medicine

## 2014-08-16 ENCOUNTER — Emergency Department (HOSPITAL_COMMUNITY): Payer: Medicare Other

## 2014-08-16 ENCOUNTER — Inpatient Hospital Stay (HOSPITAL_COMMUNITY): Payer: Medicare Other

## 2014-08-16 ENCOUNTER — Inpatient Hospital Stay (HOSPITAL_COMMUNITY)
Admission: EM | Admit: 2014-08-16 | Discharge: 2014-08-31 | DRG: 296 | Disposition: A | Discharge status: Skilled Nursing Facility | Payer: Medicare Other | Attending: Internal Medicine | Admitting: Internal Medicine

## 2014-08-16 ENCOUNTER — Encounter (HOSPITAL_COMMUNITY): Payer: Self-pay | Admitting: Emergency Medicine

## 2014-08-16 DIAGNOSIS — G9519 Other vascular myelopathies: Secondary | ICD-10-CM | POA: Diagnosis not present

## 2014-08-16 DIAGNOSIS — E876 Hypokalemia: Secondary | ICD-10-CM | POA: Diagnosis not present

## 2014-08-16 DIAGNOSIS — E162 Hypoglycemia, unspecified: Secondary | ICD-10-CM | POA: Diagnosis not present

## 2014-08-16 DIAGNOSIS — F1010 Alcohol abuse, uncomplicated: Secondary | ICD-10-CM | POA: Diagnosis present

## 2014-08-16 DIAGNOSIS — J15212 Pneumonia due to Methicillin resistant Staphylococcus aureus: Secondary | ICD-10-CM | POA: Diagnosis not present

## 2014-08-16 DIAGNOSIS — J690 Pneumonitis due to inhalation of food and vomit: Secondary | ICD-10-CM | POA: Diagnosis not present

## 2014-08-16 DIAGNOSIS — Z9114 Patient's other noncompliance with medication regimen: Secondary | ICD-10-CM

## 2014-08-16 DIAGNOSIS — E872 Acidosis, unspecified: Secondary | ICD-10-CM | POA: Diagnosis present

## 2014-08-16 DIAGNOSIS — I5032 Chronic diastolic (congestive) heart failure: Secondary | ICD-10-CM | POA: Diagnosis present

## 2014-08-16 DIAGNOSIS — G8250 Quadriplegia, unspecified: Secondary | ICD-10-CM | POA: Diagnosis present

## 2014-08-16 DIAGNOSIS — R1310 Dysphagia, unspecified: Secondary | ICD-10-CM | POA: Diagnosis present

## 2014-08-16 DIAGNOSIS — N179 Acute kidney failure, unspecified: Secondary | ICD-10-CM | POA: Diagnosis present

## 2014-08-16 DIAGNOSIS — E870 Hyperosmolality and hypernatremia: Secondary | ICD-10-CM

## 2014-08-16 DIAGNOSIS — Z91199 Patient's noncompliance with other medical treatment and regimen due to unspecified reason: Secondary | ICD-10-CM | POA: Diagnosis not present

## 2014-08-16 DIAGNOSIS — Z79899 Other long term (current) drug therapy: Secondary | ICD-10-CM

## 2014-08-16 DIAGNOSIS — E860 Dehydration: Secondary | ICD-10-CM | POA: Diagnosis present

## 2014-08-16 DIAGNOSIS — J9600 Acute respiratory failure, unspecified whether with hypoxia or hypercapnia: Secondary | ICD-10-CM | POA: Diagnosis present

## 2014-08-16 DIAGNOSIS — G931 Anoxic brain damage, not elsewhere classified: Secondary | ICD-10-CM | POA: Diagnosis present

## 2014-08-16 DIAGNOSIS — I509 Heart failure, unspecified: Secondary | ICD-10-CM | POA: Diagnosis present

## 2014-08-16 DIAGNOSIS — I469 Cardiac arrest, cause unspecified: Principal | ICD-10-CM | POA: Diagnosis present

## 2014-08-16 DIAGNOSIS — B182 Chronic viral hepatitis C: Secondary | ICD-10-CM

## 2014-08-16 DIAGNOSIS — N170 Acute kidney failure with tubular necrosis: Secondary | ICD-10-CM

## 2014-08-16 DIAGNOSIS — G9511 Acute infarction of spinal cord (embolic) (nonembolic): Secondary | ICD-10-CM

## 2014-08-16 DIAGNOSIS — I10 Essential (primary) hypertension: Secondary | ICD-10-CM | POA: Diagnosis present

## 2014-08-16 DIAGNOSIS — G9341 Metabolic encephalopathy: Secondary | ICD-10-CM | POA: Diagnosis present

## 2014-08-16 DIAGNOSIS — B370 Candidal stomatitis: Secondary | ICD-10-CM | POA: Diagnosis not present

## 2014-08-16 DIAGNOSIS — I4891 Unspecified atrial fibrillation: Secondary | ICD-10-CM | POA: Diagnosis present

## 2014-08-16 DIAGNOSIS — R339 Retention of urine, unspecified: Secondary | ICD-10-CM | POA: Diagnosis not present

## 2014-08-16 DIAGNOSIS — E46 Unspecified protein-calorie malnutrition: Secondary | ICD-10-CM | POA: Diagnosis present

## 2014-08-16 DIAGNOSIS — Z9119 Patient's noncompliance with other medical treatment and regimen: Secondary | ICD-10-CM | POA: Diagnosis not present

## 2014-08-16 DIAGNOSIS — J9601 Acute respiratory failure with hypoxia: Secondary | ICD-10-CM | POA: Diagnosis present

## 2014-08-16 DIAGNOSIS — Z515 Encounter for palliative care: Secondary | ICD-10-CM

## 2014-08-16 DIAGNOSIS — B1920 Unspecified viral hepatitis C without hepatic coma: Secondary | ICD-10-CM | POA: Diagnosis present

## 2014-08-16 DIAGNOSIS — F1020 Alcohol dependence, uncomplicated: Secondary | ICD-10-CM | POA: Diagnosis present

## 2014-08-16 DIAGNOSIS — I4892 Unspecified atrial flutter: Secondary | ICD-10-CM | POA: Diagnosis present

## 2014-08-16 DIAGNOSIS — I426 Alcoholic cardiomyopathy: Secondary | ICD-10-CM | POA: Diagnosis present

## 2014-08-16 DIAGNOSIS — R4020 Unspecified coma: Secondary | ICD-10-CM | POA: Diagnosis present

## 2014-08-16 DIAGNOSIS — I959 Hypotension, unspecified: Secondary | ICD-10-CM | POA: Diagnosis present

## 2014-08-16 LAB — BASIC METABOLIC PANEL
ANION GAP: 22 — AB (ref 5–15)
Anion gap: 19 — ABNORMAL HIGH (ref 5–15)
BUN: 15 mg/dL (ref 6–23)
BUN: 15 mg/dL (ref 6–23)
CALCIUM: 7.6 mg/dL — AB (ref 8.4–10.5)
CO2: 17 meq/L — AB (ref 19–32)
CO2: 17 meq/L — AB (ref 19–32)
CREATININE: 0.95 mg/dL (ref 0.50–1.35)
Calcium: 7.9 mg/dL — ABNORMAL LOW (ref 8.4–10.5)
Chloride: 101 mEq/L (ref 96–112)
Chloride: 105 mEq/L (ref 96–112)
Creatinine, Ser: 1.12 mg/dL (ref 0.50–1.35)
GFR calc Af Amer: 79 mL/min — ABNORMAL LOW (ref >90)
GFR calc Af Amer: >90 mL/min (ref >90)
GFR calc non Af Amer: 87 mL/min — ABNORMAL LOW (ref >90)
GFR, EST NON AFRICAN AMERICAN: 68 mL/min — AB (ref >90)
GLUCOSE: 134 mg/dL — AB (ref 70–99)
Glucose, Bld: 210 mg/dL — ABNORMAL HIGH (ref 70–99)
Potassium: 4 mEq/L (ref 3.7–5.3)
Potassium: 4.1 mEq/L (ref 3.7–5.3)
SODIUM: 140 meq/L (ref 137–147)
Sodium: 141 mEq/L (ref 137–147)

## 2014-08-16 LAB — CBC
HCT: 41.2 % (ref 39.0–52.0)
HCT: 39.4 % (ref 39.0–52.0)
Hemoglobin: 14.1 g/dL (ref 13.0–17.0)
Hemoglobin: 13.4 g/dL (ref 13.0–17.0)
MCH: 28.1 pg (ref 26.0–34.0)
MCH: 27.7 pg (ref 26.0–34.0)
MCHC: 34.2 g/dL (ref 30.0–36.0)
MCHC: 34 g/dL (ref 30.0–36.0)
MCV: 82.1 fL (ref 78.0–100.0)
MCV: 81.4 fL (ref 78.0–100.0)
PLATELETS: 200 10*3/uL (ref 150–400)
Platelets: 182 10*3/uL (ref 150–400)
RBC: 5.02 MIL/uL (ref 4.22–5.81)
RBC: 4.84 MIL/uL (ref 4.22–5.81)
RDW: 17.6 % — ABNORMAL HIGH (ref 11.5–15.5)
RDW: 17.4 % — ABNORMAL HIGH (ref 11.5–15.5)
WBC: 10.9 10*3/uL — ABNORMAL HIGH (ref 4.0–10.5)
WBC: 10.9 10*3/uL — ABNORMAL HIGH (ref 4.0–10.5)

## 2014-08-16 LAB — I-STAT CHEM 8, ED
BUN: 16 mg/dL (ref 6–23)
CALCIUM ION: 1.03 mmol/L — AB (ref 1.13–1.30)
CREATININE: 1.6 mg/dL — AB (ref 0.50–1.35)
Chloride: 108 mEq/L (ref 96–112)
GLUCOSE: 216 mg/dL — AB (ref 70–99)
HCT: 48 % (ref 39.0–52.0)
HEMOGLOBIN: 16.3 g/dL (ref 13.0–17.0)
Potassium: 3.8 mEq/L (ref 3.7–5.3)
Sodium: 139 mEq/L (ref 137–147)
TCO2: 19 mmol/L (ref 0–100)

## 2014-08-16 LAB — URINALYSIS, ROUTINE W REFLEX MICROSCOPIC
Bilirubin Urine: NEGATIVE
Glucose, UA: NEGATIVE mg/dL
KETONES UR: NEGATIVE mg/dL
Leukocytes, UA: NEGATIVE
NITRITE: NEGATIVE
PH: 5 (ref 5.0–8.0)
Protein, ur: 100 mg/dL — AB
Specific Gravity, Urine: 1.019 (ref 1.005–1.030)
UROBILINOGEN UA: 1 mg/dL (ref 0.0–1.0)

## 2014-08-16 LAB — PHOSPHORUS: PHOSPHORUS: 3.3 mg/dL (ref 2.3–4.6)

## 2014-08-16 LAB — HEPATIC FUNCTION PANEL
ALBUMIN: 3.2 g/dL — AB (ref 3.5–5.2)
ALT: 151 U/L — AB (ref 0–53)
AST: 291 U/L — ABNORMAL HIGH (ref 0–37)
Alkaline Phosphatase: 66 U/L (ref 39–117)
Bilirubin, Direct: 0.3 mg/dL (ref 0.0–0.3)
Indirect Bilirubin: 0.3 mg/dL (ref 0.3–0.9)
TOTAL PROTEIN: 6.8 g/dL (ref 6.0–8.3)
Total Bilirubin: 0.6 mg/dL (ref 0.3–1.2)

## 2014-08-16 LAB — I-STAT ARTERIAL BLOOD GAS, ED
ACID-BASE DEFICIT: 11 mmol/L — AB (ref 0.0–2.0)
Acid-base deficit: 11 mmol/L — ABNORMAL HIGH (ref 0.0–2.0)
BICARBONATE: 16.2 meq/L — AB (ref 20.0–24.0)
Bicarbonate: 17.9 mEq/L — ABNORMAL LOW (ref 20.0–24.0)
O2 SAT: 100 %
O2 Saturation: 97 %
TCO2: 19 mmol/L (ref 0–100)
TCO2: 17 mmol/L (ref 0–100)
pCO2 arterial: 49.6 mmHg — ABNORMAL HIGH (ref 35.0–45.0)
pCO2 arterial: 35 mmHg (ref 35.0–45.0)
pH, Arterial: 7.164 — CL (ref 7.350–7.450)
pH, Arterial: 7.257 — ABNORMAL LOW (ref 7.350–7.450)
pO2, Arterial: 307 mmHg — ABNORMAL HIGH (ref 80.0–100.0)
pO2, Arterial: 98 mmHg (ref 80.0–100.0)

## 2014-08-16 LAB — URINE MICROSCOPIC-ADD ON

## 2014-08-16 LAB — RAPID URINE DRUG SCREEN, HOSP PERFORMED
AMPHETAMINES: NOT DETECTED
Barbiturates: NOT DETECTED
Benzodiazepines: NOT DETECTED
Cocaine: NOT DETECTED
OPIATES: NOT DETECTED
TETRAHYDROCANNABINOL: NOT DETECTED

## 2014-08-16 LAB — I-STAT CG4 LACTIC ACID, ED: LACTIC ACID, VENOUS: 6.22 mmol/L — AB (ref 0.5–2.2)

## 2014-08-16 LAB — ETHANOL: Alcohol, Ethyl (B): 284 mg/dL — ABNORMAL HIGH (ref 0–11)

## 2014-08-16 LAB — TROPONIN I: Troponin I: <0.3 ng/mL (ref <0.30)

## 2014-08-16 LAB — I-STAT TROPONIN, ED: Troponin i, poc: 0.02 ng/mL (ref 0.00–0.08)

## 2014-08-16 LAB — PROTIME-INR
INR: 1.24 (ref 0.00–1.49)
Prothrombin Time: 15.6 seconds — ABNORMAL HIGH (ref 11.6–15.2)

## 2014-08-16 LAB — CBG MONITORING, ED: Glucose-Capillary: 163 mg/dL — ABNORMAL HIGH (ref 70–99)

## 2014-08-16 LAB — MAGNESIUM: MAGNESIUM: 1.5 mg/dL (ref 1.5–2.5)

## 2014-08-16 LAB — LACTIC ACID, PLASMA: Lactic Acid, Venous: 4.3 mmol/L — ABNORMAL HIGH (ref 0.5–2.2)

## 2014-08-16 LAB — APTT: aPTT: 32 seconds (ref 24–37)

## 2014-08-16 MED ORDER — FENTANYL CITRATE 0.05 MG/ML IJ SOLN: 100.0000 ug | Freq: Once | INTRAMUSCULAR | Status: AC | PRN

## 2014-08-16 MED ORDER — HEPARIN SODIUM (PORCINE) 5000 UNIT/ML IJ SOLN
5000.0000 [IU] | Freq: Three times a day (TID) | INTRAMUSCULAR | Status: DC
  Administered 2014-08-16 – 2014-08-27 (×33): 5000 [IU] via SUBCUTANEOUS
  Filled 2014-08-16 (×37): qty 1

## 2014-08-16 MED ORDER — ASPIRIN 300 MG RE SUPP: 300.0000 mg | RECTAL | Status: DC

## 2014-08-16 MED ORDER — SODIUM CHLORIDE 0.9 % IV SOLN
25.0000 ug/h | INTRAVENOUS | Status: DC
  Administered 2014-08-17: 250 ug/h via INTRAVENOUS
  Administered 2014-08-17 – 2014-08-18 (×3): 300 ug/h via INTRAVENOUS
  Filled 2014-08-16 (×5): qty 50

## 2014-08-16 MED ORDER — SODIUM CHLORIDE 0.9 % IV SOLN
25.0000 ug/h | INTRAVENOUS | Status: DC
  Administered 2014-08-16: 100 ug/h via INTRAVENOUS
  Filled 2014-08-16: qty 50

## 2014-08-16 MED ORDER — CETYLPYRIDINIUM CHLORIDE 0.05 % MT LIQD
7.0000 mL | Freq: Four times a day (QID) | OROMUCOSAL | Status: DC
  Administered 2014-08-16 – 2014-08-20 (×15): 7 mL via OROMUCOSAL

## 2014-08-16 MED ORDER — SODIUM CHLORIDE 0.9 % IV SOLN
INTRAVENOUS | Status: AC | PRN
  Administered 2014-08-16: 1000 mL via INTRAVENOUS

## 2014-08-16 MED ORDER — ROCURONIUM BROMIDE 50 MG/5ML IV SOLN
INTRAVENOUS | Status: DC | PRN
  Administered 2014-08-16: 100 mg via INTRAVENOUS

## 2014-08-16 MED ORDER — SODIUM CHLORIDE 0.9 % IV SOLN
1.0000 mg/h | INTRAVENOUS | Status: DC
  Administered 2014-08-16: 2 mg/h via INTRAVENOUS
  Filled 2014-08-16: qty 10

## 2014-08-16 MED ORDER — MIDAZOLAM HCL 5 MG/ML IJ SOLN: 2.0000 mg | Freq: Once | INTRAMUSCULAR | Status: AC

## 2014-08-16 MED ORDER — CISATRACURIUM BOLUS VIA INFUSION
0.0500 mg/kg | INTRAVENOUS | Status: AC | PRN
  Administered 2014-08-18: 4.6 mg via INTRAVENOUS
  Filled 2014-08-16: qty 5

## 2014-08-16 MED ORDER — MIDAZOLAM BOLUS VIA INFUSION
2.0000 mg | INTRAVENOUS | Status: DC | PRN
  Filled 2014-08-16: qty 2

## 2014-08-16 MED ORDER — SODIUM CHLORIDE 0.9 % IV SOLN
1.0000 mg/h | INTRAVENOUS | Status: DC
  Administered 2014-08-17 (×2): 6 mg/h via INTRAVENOUS
  Administered 2014-08-17 – 2014-08-18 (×3): 10 mg/h via INTRAVENOUS
  Filled 2014-08-16 (×7): qty 10

## 2014-08-16 MED ORDER — PANTOPRAZOLE SODIUM 40 MG IV SOLR
40.0000 mg | Freq: Every day | INTRAVENOUS | Status: DC
  Administered 2014-08-16 – 2014-08-24 (×9): 40 mg via INTRAVENOUS
  Filled 2014-08-16 (×10): qty 40

## 2014-08-16 MED ORDER — FENTANYL CITRATE 0.05 MG/ML IJ SOLN
INTRAMUSCULAR | Status: AC
  Filled 2014-08-16: qty 2

## 2014-08-16 MED ORDER — FOLIC ACID 5 MG/ML IJ SOLN
1.0000 mg | Freq: Every day | INTRAMUSCULAR | Status: DC
  Administered 2014-08-16 – 2014-08-24 (×9): 1 mg via INTRAVENOUS
  Filled 2014-08-16 (×10): qty 0.2

## 2014-08-16 MED ORDER — THIAMINE HCL 100 MG/ML IJ SOLN
100.0000 mg | Freq: Every day | INTRAMUSCULAR | Status: DC
  Administered 2014-08-16 – 2014-08-24 (×9): 100 mg via INTRAVENOUS
  Filled 2014-08-16 (×11): qty 1

## 2014-08-16 MED ORDER — ETOMIDATE 2 MG/ML IV SOLN
INTRAVENOUS | Status: DC | PRN
  Administered 2014-08-16: 10 mg via INTRAVENOUS

## 2014-08-16 MED ORDER — ASPIRIN 300 MG RE SUPP
300.0000 mg | RECTAL | Status: AC
  Administered 2014-08-16: 300 mg via RECTAL
  Filled 2014-08-16: qty 1

## 2014-08-16 MED ORDER — CHLORHEXIDINE GLUCONATE 0.12 % MT SOLN: 15.0000 mL | Freq: Two times a day (BID) | OROMUCOSAL | Status: DC

## 2014-08-16 MED ORDER — FENTANYL CITRATE 0.05 MG/ML IJ SOLN: 100.0000 ug | Freq: Once | INTRAMUSCULAR | Status: AC

## 2014-08-16 MED ORDER — CETYLPYRIDINIUM CHLORIDE 0.05 % MT LIQD: 7.0000 mL | Freq: Four times a day (QID) | OROMUCOSAL | Status: DC

## 2014-08-16 MED ORDER — FENTANYL BOLUS VIA INFUSION
50.0000 ug | INTRAVENOUS | Status: DC | PRN
  Filled 2014-08-16 (×2): qty 50

## 2014-08-16 MED ORDER — CHLORHEXIDINE GLUCONATE 0.12 % MT SOLN
15.0000 mL | Freq: Two times a day (BID) | OROMUCOSAL | Status: DC
  Administered 2014-08-16 – 2014-08-20 (×8): 15 mL via OROMUCOSAL
  Filled 2014-08-16 (×7): qty 15

## 2014-08-16 MED ORDER — NOREPINEPHRINE BITARTRATE 1 MG/ML IV SOLN
0.5000 ug/min | INTRAVENOUS | Status: DC
  Administered 2014-08-16: 10 ug/min via INTRAVENOUS
  Filled 2014-08-16 (×2): qty 4

## 2014-08-16 MED ORDER — CISATRACURIUM BOLUS VIA INFUSION
0.1000 mg/kg | Freq: Once | INTRAVENOUS | Status: AC
  Filled 2014-08-16: qty 10

## 2014-08-16 MED ORDER — SODIUM CHLORIDE 0.9 % IV SOLN
1.0000 ug/kg/min | INTRAVENOUS | Status: DC
  Administered 2014-08-16: 1 ug/kg/min via INTRAVENOUS
  Filled 2014-08-16: qty 20

## 2014-08-16 MED ORDER — SODIUM CHLORIDE 0.9 % IV BOLUS (SEPSIS)
1000.0000 mL | Freq: Once | INTRAVENOUS | Status: DC
Start: 2014-08-16 — End: 2014-08-16

## 2014-08-16 MED ORDER — ARTIFICIAL TEARS OP OINT
1.0000 "application " | TOPICAL_OINTMENT | Freq: Three times a day (TID) | OPHTHALMIC | Status: DC
  Administered 2014-08-16 – 2014-08-18 (×6): 1 via OPHTHALMIC
  Filled 2014-08-16: qty 3.5

## 2014-08-16 MED ORDER — NOREPINEPHRINE BITARTRATE 1 MG/ML IV SOLN
0.5000 ug/min | INTRAVENOUS | Status: DC
  Administered 2014-08-16: 10 ug/min via INTRAVENOUS
  Filled 2014-08-16: qty 4

## 2014-08-16 MED ORDER — SODIUM CHLORIDE 0.9 % IV SOLN
1.0000 ug/kg/min | INTRAVENOUS | Status: DC
  Administered 2014-08-18: 1.5 ug/kg/min via INTRAVENOUS
  Filled 2014-08-16 (×2): qty 20

## 2014-08-16 MED ORDER — MIDAZOLAM HCL 5 MG/ML IJ SOLN: 2.0000 mg | Freq: Once | INTRAMUSCULAR | Status: AC | PRN

## 2014-08-16 MED ORDER — SODIUM CHLORIDE 0.9 % IV SOLN
2000.0000 mL | Freq: Once | INTRAVENOUS | Status: AC
  Administered 2014-08-16: 2000 mL via INTRAVENOUS

## 2014-08-16 NOTE — Progress Notes (Signed)
08/16/14 2000  Clinical Encounter Type  Visited With Health care provider;Other (Comment) (EMS, RN)  Visit Type ED  Referral From Nurse  Stress Factors  Patient Stress Factors Health changes    Chaplain responded to CPR in transit. Consulted with EMS upon patient's arrival: learned that friends/family were aware of patient's location, but not present. No phone number for emergency contact in patient's chart. Consulted with patient's RN, who confirmed that family was aware. Chaplain to be paged if needed.   Ethelene Browns, Chaplain General/On-call: [phone]

## 2014-08-16 NOTE — ED Notes (Addendum)
63 yo male post-cpr with ROSC, was down 10-15 minutes before CPR started (down initially at [place]), CPR was done for 15 minutes, 2 epi's given and 1 amp of d50, patient was PEA before ROSC, when pulses returned patient brady'd down and another 2 minutes CPR, rate 200 afib, patient now is in afib with rate of 120, I/O in right leg, king airway secured.

## 2014-08-16 NOTE — Procedures (Signed)
Central Venous Catheter Insertion Procedure Note Anthony Mcclure [id number] [birth year]  Procedure: Insertion of Central Venous Catheter Indications: Assessment of intravascular volume, Drug and/or fluid administration and Frequent blood sampling  Procedure Details Consent: Risks of procedure as well as the alternatives and risks of each were explained to the (patient/caregiver).  Consent for procedure obtained. and Unable to obtain consent because of altered level of consciousness. Time Out: Verified patient identification, verified procedure, site/side was marked, verified correct patient position, special equipment/implants available, medications/allergies/relevent history reviewed, required imaging and test results available.  Performed  Maximum sterile technique was used including antiseptics, cap, gloves, gown, hand hygiene, mask and sheet. Skin prep: Chlorhexidine; local anesthetic administered A antimicrobial bonded/coated triple lumen catheter was placed in the right internal jugular vein using the Seldinger technique.  Evaluation Blood flow good Complications: No apparent complications Patient did tolerate procedure well. Chest X-ray ordered to verify placement.  CXR: pending.  Procedure performed under direct ultrasound guidance for real time vessel cannulation.      Montey Hora, [facility] Pgr: [phone]  or [phone]   Chesley Mires, MD [place] 08/16/2014, 10:10 PM Pager:  [phone] After 3pm call: [phone]

## 2014-08-16 NOTE — ED Notes (Signed)
Pt to scanner 3
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
## 2014-08-16 NOTE — ED Notes (Signed)
Critical care at bedside inserting central line.

## 2014-08-16 NOTE — ED Notes (Signed)
Resident visualizing vocal cord, Dr. Steffanie Dunn at bedside

## 2014-08-16 NOTE — ED Notes (Addendum)
Patient currently being intubated.  23 at the lip, ETT 7.5. Positive color change, bilateral breath sounds equal.  Portable chest called

## 2014-08-16 NOTE — ED Notes (Signed)
Radiology called to confirm placement; unavailable at this time; advised to take to ICU and they will come there.
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
## 2014-08-16 NOTE — Procedures (Signed)
Arterial Catheter Insertion Procedure Note Gloyd Happ [id number] August 12, 1951  Procedure: Insertion of Arterial Catheter  Indications: Frequent blood sampling  Procedure Details Consent: Unable to obtain consent because of emergent medical necessity. Time Out: Verified patient identification, verified procedure, site/side was marked, verified correct patient position, special equipment/implants available, medications/allergies/relevent history reviewed, required imaging and test results available.  Performed  Maximum sterile technique was used including antiseptics, cap, gloves, gown, hand hygiene, mask and sheet. Skin prep: Chlorhexidine; local anesthetic administered 20 gauge catheter was inserted into right radial artery using the Seldinger technique.  Evaluation Blood flow good; BP tracing good. Complications: No apparent complications.  Good waveform noted. No complications noted.   [place] 08/16/2014

## 2014-08-16 NOTE — ED Notes (Signed)
Patient's belongs include: several paper towels, last will and testament of Avie Arenas Dimon, $2.69, a belt, pair of shoes, and a empty black trash bag.  Patients pants were soiled and were cut during initial assessment and stabilization, patient arrived with no shirt.

## 2014-08-17 ENCOUNTER — Other Ambulatory Visit (HOSPITAL_COMMUNITY): Payer: Self-pay

## 2014-08-17 ENCOUNTER — Inpatient Hospital Stay (HOSPITAL_COMMUNITY): Payer: Medicare Other

## 2014-08-17 DIAGNOSIS — I369 Nonrheumatic tricuspid valve disorder, unspecified: Secondary | ICD-10-CM

## 2014-08-17 DIAGNOSIS — N170 Acute kidney failure with tubular necrosis: Secondary | ICD-10-CM

## 2014-08-17 LAB — BLOOD GAS, ARTERIAL
Acid-base deficit: 10.7 mmol/L — ABNORMAL HIGH (ref 0.0–2.0)
BICARBONATE: 13.6 meq/L — AB (ref 20.0–24.0)
Drawn by: 369891
FIO2: 0.5 %
MECHVT: 650 mL
PEEP: 5 cmH2O
PH ART: 7.369 (ref 7.350–7.450)
Patient temperature: 91.4
RATE: 20 resp/min
TCO2: 14.4 mmol/L (ref 0–100)
pCO2 arterial: 24.2 mmHg — ABNORMAL LOW (ref 35.0–45.0)
pO2, Arterial: 156 mmHg — ABNORMAL HIGH (ref 80.0–100.0)

## 2014-08-17 LAB — BASIC METABOLIC PANEL
ANION GAP: 22 — AB (ref 5–15)
ANION GAP: 20 — AB (ref 5–15)
Anion gap: 23 — ABNORMAL HIGH (ref 5–15)
Anion gap: 17 — ABNORMAL HIGH (ref 5–15)
Anion gap: 15 (ref 5–15)
BUN: 14 mg/dL (ref 6–23)
BUN: 13 mg/dL (ref 6–23)
BUN: 11 mg/dL (ref 6–23)
BUN: 10 mg/dL (ref 6–23)
BUN: 10 mg/dL (ref 6–23)
CALCIUM: 7.5 mg/dL — AB (ref 8.4–10.5)
CALCIUM: 7.6 mg/dL — AB (ref 8.4–10.5)
CALCIUM: 7.5 mg/dL — AB (ref 8.4–10.5)
CALCIUM: 7.5 mg/dL — AB (ref 8.4–10.5)
CO2: 12 meq/L — AB (ref 19–32)
CO2: 12 meq/L — AB (ref 19–32)
CO2: 13 meq/L — AB (ref 19–32)
CO2: 14 meq/L — AB (ref 19–32)
CO2: 16 meq/L — AB (ref 19–32)
CREATININE: 0.72 mg/dL (ref 0.50–1.35)
CREATININE: 0.71 mg/dL (ref 0.50–1.35)
CREATININE: 0.65 mg/dL (ref 0.50–1.35)
CREATININE: 0.59 mg/dL (ref 0.50–1.35)
CREATININE: 0.62 mg/dL (ref 0.50–1.35)
Calcium: 7.5 mg/dL — ABNORMAL LOW (ref 8.4–10.5)
Chloride: 109 mEq/L (ref 96–112)
Chloride: 108 mEq/L (ref 96–112)
Chloride: 110 mEq/L (ref 96–112)
Chloride: 108 mEq/L (ref 96–112)
Chloride: 111 mEq/L (ref 96–112)
GFR calc Af Amer: >90 mL/min (ref >90)
GFR calc Af Amer: >90 mL/min (ref >90)
GFR calc Af Amer: >90 mL/min (ref >90)
GFR calc Af Amer: >90 mL/min (ref >90)
GFR calc non Af Amer: >90 mL/min (ref >90)
GFR calc non Af Amer: >90 mL/min (ref >90)
GFR calc non Af Amer: >90 mL/min (ref >90)
GFR calc non Af Amer: >90 mL/min (ref >90)
GFR calc non Af Amer: >90 mL/min (ref >90)
GLUCOSE: 91 mg/dL (ref 70–99)
GLUCOSE: 97 mg/dL (ref 70–99)
Glucose, Bld: 135 mg/dL — ABNORMAL HIGH (ref 70–99)
Glucose, Bld: 87 mg/dL (ref 70–99)
Glucose, Bld: 89 mg/dL (ref 70–99)
Potassium: 3.5 mEq/L — ABNORMAL LOW (ref 3.7–5.3)
Potassium: 3.6 mEq/L — ABNORMAL LOW (ref 3.7–5.3)
Potassium: 3.6 mEq/L — ABNORMAL LOW (ref 3.7–5.3)
Potassium: 4.1 mEq/L (ref 3.7–5.3)
Potassium: 3.4 mEq/L — ABNORMAL LOW (ref 3.7–5.3)
Sodium: 144 mEq/L (ref 137–147)
Sodium: 142 mEq/L (ref 137–147)
Sodium: 143 mEq/L (ref 137–147)
Sodium: 139 mEq/L (ref 137–147)
Sodium: 142 mEq/L (ref 137–147)

## 2014-08-17 LAB — POCT I-STAT, CHEM 8
BUN: 13 mg/dL (ref 6–23)
BUN: 13 mg/dL (ref 6–23)
BUN: 16 mg/dL (ref 6–23)
CHLORIDE: 110 meq/L (ref 96–112)
CHLORIDE: 110 meq/L (ref 96–112)
Calcium, Ion: 1.08 mmol/L — ABNORMAL LOW (ref 1.13–1.30)
Calcium, Ion: 1.09 mmol/L — ABNORMAL LOW (ref 1.13–1.30)
Calcium, Ion: 1.1 mmol/L — ABNORMAL LOW (ref 1.13–1.30)
Chloride: 111 mEq/L (ref 96–112)
Creatinine, Ser: 0.9 mg/dL (ref 0.50–1.35)
Creatinine, Ser: 0.9 mg/dL (ref 0.50–1.35)
Creatinine, Ser: 1 mg/dL (ref 0.50–1.35)
GLUCOSE: 122 mg/dL — AB (ref 70–99)
Glucose, Bld: 99 mg/dL (ref 70–99)
Glucose, Bld: 90 mg/dL (ref 70–99)
HCT: 46 % (ref 39.0–52.0)
HEMATOCRIT: 45 % (ref 39.0–52.0)
HEMATOCRIT: 45 % (ref 39.0–52.0)
HEMOGLOBIN: 15.3 g/dL (ref 13.0–17.0)
Hemoglobin: 15.3 g/dL (ref 13.0–17.0)
Hemoglobin: 15.6 g/dL (ref 13.0–17.0)
POTASSIUM: 3 meq/L — AB (ref 3.7–5.3)
POTASSIUM: 3.2 meq/L — AB (ref 3.7–5.3)
Potassium: 4.2 mEq/L (ref 3.7–5.3)
SODIUM: 141 meq/L (ref 137–147)
Sodium: 142 mEq/L (ref 137–147)
Sodium: 143 mEq/L (ref 137–147)
TCO2: 13 mmol/L (ref 0–100)
TCO2: 14 mmol/L (ref 0–100)
TCO2: 15 mmol/L (ref 0–100)

## 2014-08-17 LAB — PROTIME-INR
INR: 1.31 (ref 0.00–1.49)
Prothrombin Time: 16.3 seconds — ABNORMAL HIGH (ref 11.6–15.2)

## 2014-08-17 LAB — GLUCOSE, CAPILLARY
GLUCOSE-CAPILLARY: 111 mg/dL — AB (ref 70–99)
GLUCOSE-CAPILLARY: 68 mg/dL — AB (ref 70–99)
GLUCOSE-CAPILLARY: 81 mg/dL (ref 70–99)
GLUCOSE-CAPILLARY: 86 mg/dL (ref 70–99)
GLUCOSE-CAPILLARY: 92 mg/dL (ref 70–99)
GLUCOSE-CAPILLARY: 98 mg/dL (ref 70–99)
Glucose-Capillary: 119 mg/dL — ABNORMAL HIGH (ref 70–99)
Glucose-Capillary: 121 mg/dL — ABNORMAL HIGH (ref 70–99)
Glucose-Capillary: 133 mg/dL — ABNORMAL HIGH (ref 70–99)
Glucose-Capillary: 80 mg/dL (ref 70–99)
Glucose-Capillary: 84 mg/dL (ref 70–99)
Glucose-Capillary: 81 mg/dL (ref 70–99)
Glucose-Capillary: 72 mg/dL (ref 70–99)
Glucose-Capillary: 83 mg/dL (ref 70–99)
Glucose-Capillary: 87 mg/dL (ref 70–99)
Glucose-Capillary: 76 mg/dL (ref 70–99)
Glucose-Capillary: 86 mg/dL (ref 70–99)

## 2014-08-17 LAB — MRSA PCR SCREENING: MRSA by PCR: NEGATIVE

## 2014-08-17 LAB — LACTIC ACID, PLASMA
Lactic Acid, Venous: 2.1 mmol/L (ref 0.5–2.2)
Lactic Acid, Venous: 1.7 mmol/L (ref 0.5–2.2)

## 2014-08-17 LAB — APTT: APTT: 37 s (ref 24–37)

## 2014-08-17 LAB — TROPONIN I
Troponin I: <0.3 ng/mL (ref <0.30)
Troponin I: <0.3 ng/mL (ref <0.30)

## 2014-08-17 LAB — TSH: TSH: 0.765 u[IU]/mL (ref 0.350–4.500)

## 2014-08-17 MED ORDER — HYDRALAZINE HCL 20 MG/ML IJ SOLN
10.0000 mg | INTRAMUSCULAR | Status: DC | PRN
  Administered 2014-08-17 – 2014-08-21 (×6): 20 mg via INTRAVENOUS
  Administered 2014-08-21 (×2): 10 mg via INTRAVENOUS
  Administered 2014-08-21: 40 mg via INTRAVENOUS
  Filled 2014-08-17 (×8): qty 1
  Filled 2014-08-17: qty 2

## 2014-08-17 MED ORDER — POTASSIUM CHLORIDE 10 MEQ/50ML IV SOLN
10.0000 meq | INTRAVENOUS | Status: AC
  Administered 2014-08-17 (×6): 10 meq via INTRAVENOUS
  Filled 2014-08-17 (×6): qty 50

## 2014-08-17 MED ORDER — SODIUM CHLORIDE 0.9 % IV SOLN
INTRAVENOUS | Status: DC
  Administered 2014-08-17 – 2014-08-19 (×2): via INTRAVENOUS

## 2014-08-17 MED ORDER — DEXTROSE 50 % IV SOLN
INTRAVENOUS | Status: AC
  Administered 2014-08-17: 25 mL
  Filled 2014-08-17: qty 50

## 2014-08-17 MED ORDER — DEXTROSE-NACL 5-0.9 % IV SOLN
INTRAVENOUS | Status: DC
  Administered 2014-08-17: 50 mL/h via INTRAVENOUS

## 2014-08-17 MED ORDER — POTASSIUM CHLORIDE 20 MEQ/15ML (10%) PO LIQD
20.0000 meq | Freq: Once | ORAL | Status: AC
  Administered 2014-08-17: 20 meq
  Filled 2014-08-17: qty 15

## 2014-08-17 MED ORDER — MAGNESIUM SULFATE 40 MG/ML IJ SOLN
2.0000 g | Freq: Once | INTRAMUSCULAR | Status: AC
  Administered 2014-08-17: 2 g via INTRAVENOUS
  Filled 2014-08-17: qty 50

## 2014-08-17 NOTE — Progress Notes (Signed)
Hypoglycemic Event  CBG: 68  Treatment: 1/2 amp D50  Symptoms: None  Follow-up CBG: 0845    Time:CBG Result:81  Possible Reasons for Event: NPO  Comments/MD notified: During rounds    Maryjean Morn  Remember to initiate Hypoglycemia Order Set & complete

## 2014-08-17 NOTE — Progress Notes (Signed)
  Echocardiogram 2D Echocardiogram has been performed.  Khamauri Bauernfeind 08/17/2014, 1:05 PM

## 2014-08-17 NOTE — H&P (Signed)
PULMONARY / CRITICAL CARE MEDICINE   Name: Anthony Mcclure MRN: [id number] DOB: May 13, 1951    ADMISSION DATE:  08/16/2014 CONSULTATION DATE:  08/17/2014  REFERRING MD :  EDP  CHIEF COMPLAINT:  PEA Arrest  INITIAL PRESENTATION: [name] brought to ED on 9/3 after he suffered an out of hospital cardiac arrest.  Arrest was witnessed; however, no CPR was performed until 10-15 minutes later when EMS arrived.  Upon their arrival, rhythm was noted to be PEA.  ACLS was performed for 15 minutes before ROSC.  During resuscitation, pt received 2 of epi and 1amp D50.  In ED, pt was comatose and was intubated for respiratory failure.  PCCM was consulted for admission and initiation of hypothermia protocol.  STUDIES:  9/3 CT Head / C-Spine >>> no acute process.  SIGNIFICANT EVENTS: 9/3 - presented to [facility] ED.  Hypothermia protocol started. 9/4 - at goal temp  SUBJECTIVE: no pressors  VITAL SIGNS: Temp:  [91 F (32.8 C)-98.7 F (37.1 C)] 91 F (32.8 C) (09/04 1100) Pulse Rate:  [57-111] 57 (09/04 1100) Resp:  [15-32] 18 (09/04 1100) BP: (80-150)/(54-96) 145/96 mmHg (09/04 1100) SpO2:  [92 %-100 %] 100 % (09/04 1100) Arterial Line BP: (117-153)/(72-94) 145/94 mmHg (09/04 1100) FiO2 (%):  [50 %-100 %] 50 % (09/04 0800) Weight:  [91.31 kg (201 lb 4.8 oz)-92.4 kg (203 lb 11.3 oz)] 91.31 kg (201 lb 4.8 oz) (09/03 2215) HEMODYNAMICS: CVP:  [2 mmHg-17 mmHg] 2 mmHg VENTILATOR SETTINGS: Vent Mode:  [-] PRVC FiO2 (%):  [50 %-100 %] 50 % Set Rate:  [15 bmp-20 bmp] 18 bmp Vt Set:  [620 mL-650 mL] 650 mL PEEP:  [5 cmH20] 5 cmH20 Plateau Pressure:  [19 cmH20-20 cmH20] 19 cmH20 INTAKE / OUTPUT: Intake/Output     09/03 0701 - 09/04 0700 09/04 0701 - 09/05 0700   I.V. (mL/kg) 3551.2 (38.9) 146 (1.6)   IV Piggyback 350    Total Intake(mL/kg) 3901.2 (42.7) 146 (1.6)   Urine (mL/kg/hr) 1465 225 (0.6)   Other 350    Total Output 1815 225   Net +2086.2 -79        Stool Occurrence  2 x    PHYSICAL  EXAMINATION: General: paralyzed Neuro: paralyzed and sedated HEENT: Advance/AT. Pupils nonreactive 2 mm Cardiovascular: RRR, no M/R/G.  Lungs:CTA Abdomen: BS hypoactive, soft, NT/ND.  Musculoskeletal: No gross deformities, no edema Skin: Intact, cool, no rashes.  LABS:  CBC  Recent Labs Lab 08/16/14 1900  08/16/14 2250 08/17/14 0056 08/17/14 0241 08/17/14 0435  WBC 10.9*  --  10.9*  --   --   --   HGB 14.1  < > 13.4 15.3 15.3 15.6  HCT 41.2  < > 39.4 45.0 45.0 46.0  PLT 200  --  182  --   --   --   < > = values in this interval not displayed. Coag's  Recent Labs Lab 08/16/14 2250 08/17/14 0435  APTT 32 37  INR 1.24 1.31    BMET  Recent Labs Lab 08/16/14 1900  08/16/14 2250 08/17/14 0056 08/17/14 0241 08/17/14 0435  NA 140  < > 141 142 143 144  141  K 4.0  < > 4.1 3.0* 3.2* 3.5*  4.2  CL 101  < > 105 111 110 109  110  CO2 17*  --  17*  --   --  12*  BUN 15  < > 15 13 13 14  16   CREATININE 1.12  < > 0.95  1.00 0.90 0.72  0.90  GLUCOSE 210*  < > 134* 122* 99 91  90  < > = values in this interval not displayed. Electrolytes  Recent Labs Lab 08/16/14 1900 08/16/14 2250 08/17/14 0435  CALCIUM 7.9* 7.6* 7.5*  MG  --  1.5  --   PHOS  --  3.3  --    Sepsis Markers  Recent Labs Lab 08/16/14 2250 08/17/14 0335 08/17/14 1000  LATICACIDVEN 4.3* 2.1 1.7   ABG  Recent Labs Lab 08/16/14 1954 08/16/14 2048 08/17/14 0059  PHART 7.164* 7.257* 7.369  PCO2ART 49.6* 35.0 24.2*  PO2ART 307.0* 98.0 156.0*   Liver Enzymes  Recent Labs Lab 08/16/14 2250  AST 291*  ALT 151*  ALKPHOS 66  BILITOT 0.6  ALBUMIN 3.2*    Cardiac Enzymes  Recent Labs Lab 08/16/14 2250 08/17/14 0330 08/17/14 0915  TROPONINI <0.30 <0.30 <0.30    Glucose  Recent Labs Lab 08/17/14 0330 08/17/14 0429 08/17/14 0526 08/17/14 0738 08/17/14 0845 08/17/14 1003  GLUCAP 86 84 80 68* 98 81    Imaging Ct Head Wo Contrast  08/16/2014   CLINICAL DATA:  CPR  EXAM:  CT HEAD WITHOUT CONTRAST  CT MAXILLOFACIAL WITHOUT CONTRAST  TECHNIQUE: Multidetector CT imaging of the head and cervical spine structures were performed using the standard protocol without intravenous contrast. Multiplanar CT image reconstructions of the maxillofacial structures were also generated.  COMPARISON:  10/24/2010 and 06/14/2009  FINDINGS: CT HEAD FINDINGS  Stable left frontal soft tissue lipoma over the periosteum.  Global atrophy and chronic ischemic changes are stable. No mass effect, midline shift, or acute intracranial hemorrhage. Mastoid air cells are clear. Minimal mucosal thickening in the ethmoid air cells. Old fracture of the medial wall of the left orbit.  CT CERVICAL SPINE FINDINGS  No acute fracture. No dislocation. 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 There is multilevel spinal stenosis. No obvious spinal hematoma. No obvious soft tissue injury. Endotracheal tube is in place.  IMPRESSION: No acute intracranial pathology.  No evidence of acute cervical spine injury. Advanced degenerative changes are noted.   Electronically Signed   By: Maryclare Bean M.D.   On: 08/16/2014 19:59   Ct Cervical Spine Wo Contrast  08/16/2014   CLINICAL DATA:  CPR  EXAM: CT HEAD WITHOUT CONTRAST  CT MAXILLOFACIAL WITHOUT CONTRAST  TECHNIQUE: Multidetector CT imaging of the head and cervical spine structures were performed using the standard protocol without intravenous contrast. Multiplanar CT image reconstructions of the maxillofacial structures were also generated.  COMPARISON:  10/24/2010 and 06/14/2009  FINDINGS: CT HEAD FINDINGS  Stable left frontal soft tissue lipoma over the periosteum.  Global atrophy and chronic ischemic changes are stable. No mass effect, midline shift, or acute intracranial hemorrhage. Mastoid air cells are clear. Minimal mucosal thickening in the ethmoid air cells. Old fracture of the medial wall of the left orbit.  CT CERVICAL SPINE FINDINGS  No  acute fracture. No dislocation. 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 There is multilevel spinal stenosis. No obvious spinal hematoma. No obvious soft tissue injury. Endotracheal tube is in place.  IMPRESSION: No acute intracranial pathology.  No evidence of acute cervical spine injury. Advanced degenerative changes are noted.   Electronically Signed   By: Maryclare Bean M.D.   On: 08/16/2014 19:59   Portable Chest Xray In Am  08/17/2014   CLINICAL DATA:  Reassess support tube placement.  EXAM: PORTABLE CHEST - 1  VIEW  COMPARISON:  Portable chest x-ray of August 16, 2014  FINDINGS: The lungs are reasonably well inflated and clear. The cardiac silhouette is mildly enlarged though stable. A external pacing -defibrillator pads are present. The pulmonary vascularity is not engorged. The endotracheal tube tip lies 3.9 cm above the crotch of the carina. The esophagogastric tube tip lies in the gastric cardia. The proximal port likely lies at or above the GE junction. The right internal jugular venous catheter tip lies in the midportion of the SVC.  IMPRESSION: 1. There is no evidence of pneumonia nor pulmonary edema. 2. The endotracheal tube and internal jugular catheter are in appropriate position. Advancement of the nasogastric tube by 5-10 cm is recommended to assure that the proximal port is below the GE junction.   Electronically Signed   By: David  Martinique   On: 08/17/2014 07:25   Dg Chest Port 1 View  08/16/2014   CLINICAL DATA:  line placement  EXAM: PORTABLE CHEST - 1 VIEW  COMPARISON:  08/16/2014  FINDINGS: Right IJ catheter tip projects over the mid SVC. Endotracheal tube tip projects 3.7 cm proximal to the carina. NG tube descends below the level of the image. Other wires and tubes are presumably external. Left chest external support devices obscure detailed evaluation. Heart size enlarged. Mediastinal contours otherwise within normal range. No definite consolidation,  pleural effusion, or pneumothorax. Bilateral acromioclavicular DJD. No interval osseous change.  IMPRESSION: Support devices are appropriately positioned as above. No pneumothorax.  Mild cardiac enlargement.  No focal consolidation   Electronically Signed   By: Carlos Levering M.D.   On: 08/16/2014 23:03   Dg Chest Portable 1 View  08/16/2014   CLINICAL DATA:  Acute respiratory failure.  Atrial fibrillation.  EXAM: PORTABLE CHEST - 1 VIEW  COMPARISON:  02/14/2014  FINDINGS: Endotracheal tube is seen with tip approximately 6 cm above the carina.  Both lungs are clear. No evidence of pleural effusion. Mild cardiomegaly is stable.  IMPRESSION: Endotracheal tube in satisfactory position. No acute lung disease. Stable mild cardiomegaly.   Electronically Signed   By: Earle Gell M.D.   On: 08/16/2014 19:36    ASSESSMENT / PLAN:  PULMONARY OETT 9/3 >>> A: Acute respiratory failure in the setting of PEA cardiac arrest. P:   Avoid acidosis Keep same MV, after rewarm likely will have to increase rate pcxr in am   CARDIOVASCULAR CVL R IJ 9/3 >>> A:  S/p PEA arrest - downtime 25-30 minutes before ROSC Hx CHF w preserved EF - echo 02/14/14 >>> EF 50-55%, inferobasal hypokinesis Hx HTN Hx PAF - currently in NSR P:  Hypothermia protocol. Goal MAP > 85 during cooling. CVP 2 noted but no role gross overload if MAP wnl, with rewarm may need bolus TTE, r/o per effusion, water bottle on pcxr? Hold outpatient lopressor, verapamil.  RENAL A:   AKI - s/p PEA arrest High risk cold diuresis P:   NS kvo Avoid free water Check Mag, Phos. BMP q2hrs while on cooling  GASTROINTESTINAL A:   Nutrition Hx HCV P:   NPO. Nutrition consult for TF's in am  SUP:  Pantoprazole.  HEMATOLOGIC A:   VTE Prophylaxis Risk coagulapthy from cooling P:  SCD's / Heparin. Coags q8hrs x 3 CBC in AM.  INFECTIOUS A:   No evidence of infection immunosuppressant  From cooling P:   UCx 9/3 >>> Low threshold  ABX  ENDOCRINE A:   hypoglycemia P:   Dc any insulin Continued to  check cbg while cooling Add d5 to fluids Assess tsh  NEUROLOGIC A:   Acute metabolic encephalopathy s/p PEA arrest Hx Seizures - not on outpatient medications Hx Alcohol Abuse, intoxication R/o severe anoxia given rhythm and down time Undergoing hypothermia P:   RASS goal: - 4 to -5. Sedation:  Versed / Fentanyl. Hold WUA during hypothermia protocol. Check alcohol level noted, add fluids Thiamine / Folate. eeg   TODAY'S SUMMARY:rewarm late tonight, tf in am, concern poor outcome, eeg   Ccm time 30 min   Lavon Paganini. Titus Mould, MD, [place] Pgr: [facility]

## 2014-08-17 NOTE — Progress Notes (Signed)
eLink Physician-Brief Progress Note Patient Name: Anthony Mcclure DOB: Jun 05, 1951 MRN: [id number]   Date of Service  08/17/2014  HPI/Events of Note  Arctic sun protocol patient Marked hypertension (MAP > 130 consistently) HR OK  eICU Interventions  Prn hydralazine for systolic > 706, reminded RN to maintain MAP > 85     Intervention Category Intermediate Interventions: Hypertension - evaluation and management  Kym Fenter 08/17/2014, 3:16 PM

## 2014-08-17 NOTE — Progress Notes (Signed)
ABG    Component Value Date/Time   PHART 7.369 08/17/2014 0059   PCO2ART 24.2* 08/17/2014 0059   PO2ART 156.0* 08/17/2014 0059   HCO3 13.6* 08/17/2014 0059   TCO2 14.4 08/17/2014 0059   ACIDBASEDEF 10.7* 08/17/2014 0059   O2SAT 97.0 08/16/2014 2048

## 2014-08-17 NOTE — Progress Notes (Signed)
eLink Physician-Brief Progress Note Patient Name: Anthony Mcclure DOB: May 26, 1951 MRN: [id number]   Date of Service  08/17/2014  HPI/Events of Note  Hypokalemia and low normal Mag earlier.  eICU Interventions  Plan: Potassium replaced Mag replaced     Intervention Category Intermediate Interventions: Electrolyte abnormality - evaluation and management  DETERDING,ELIZABETH 08/17/2014, 1:06 AM

## 2014-08-17 NOTE — Progress Notes (Signed)
Pt is cooling, advised nurse we will do EEG tomorrow after pt is re-warmed.

## 2014-08-17 NOTE — Progress Notes (Signed)
Utilization Review Completed.Donne Anon T9/03/2014

## 2014-08-17 NOTE — Progress Notes (Signed)
INITIAL NUTRITION ASSESSMENT  DOCUMENTATION CODES Per approved criteria  -Not Applicable   INTERVENTION:  TF recommendations: Initiate Vital 1.5 @ 25 ml/hr via OGT and increase by 10 ml every 4 hours to goal rate of 45 ml/hr.    60 ml Prostat BID.     Tube feeding regimen provides 2020 kcal (99.5% of needs), 133 grams of protein, and 825 ml of H2O.   Will continue to monitor  NUTRITION DIAGNOSIS: Inadequate oral intake related to inability to eat as evidenced by NPO status.  Goal: Pt to meet >/= 90% of their estimated nutrition needs   Monitor:  Hypothermia protocol, TF initiation & tolerance, respiratory status, weight, labs, I/O's  Reason for Assessment: Consult received to initiate and manage enteral nutrition support.  Admitting Dx: cardiac arrest  ASSESSMENT: [name] brought to ED on 9/3 after he suffered an out of hospital cardiac arrest. In ED, pt was comatose and was intubated for respiratory failure. PCCM was consulted for admission and initiation of hypothermia protocol.  Pt currently on hypothermia protocol. Per Nurse tech, pt is planned to be rewarmed later tonight. Will provide TF recommendations.  Patient is currently intubated on ventilator support d/t cardiac arrest and respiratory failure MV: 12.0 L/min Temp (24hrs), Avg:92.8 F (33.8 C), Min:91 F (32.8 C), Max:98.7 F (37.1 C)  Nutrition focused physical exam shows no sign of depletion of muscle mass or body fat.  Labs reviewed : Low K  Height: Ht Readings from Last 1 Encounters:  08/16/14 6" (0.152 m)    Weight: Wt Readings from Last 1 Encounters:  08/16/14 201 lb 4.8 oz (91.31 kg)    Ideal Body Weight: 178 lb  % Ideal Body Weight: 113%  Wt Readings from Last 10 Encounters:  08/16/14 201 lb 4.8 oz (91.31 kg)  04/20/14 203 lb 12.8 oz (92.443 kg)  02/19/14 201 lb 3.2 oz (91.264 kg)  02/15/14 191 lb 12.8 oz (87 kg)  08/03/13 197 lb (89.359 kg)  07/20/13 190 lb (86.183 kg)   02/16/13 201 lb 6.4 oz (91.354 kg)  11/16/12 230 lb (104.327 kg)  02/23/12 195 lb (88.451 kg)  02/02/12 196 lb 6.9 oz (89.1 kg)    Usual Body Weight: variable  % Usual Body Weight: NA  BMI:  27.3  Estimated Nutritional Needs: Kcal: 2030 Protein: 135-145g Fluid: 2 L/day  Skin: intact  Diet Order: NPO  EDUCATION NEEDS: -No education needs identified at this time   Intake/Output Summary (Last 24 hours) at 08/17/14 0906 Last data filed at 08/17/14 0800  Gross per 24 hour  Intake 3931.69 ml  Output   2040 ml  Net 1891.69 ml    Last BM: 9/4   Labs:   Recent Labs Lab 08/16/14 1900  08/16/14 2250 08/17/14 0056 08/17/14 0241 08/17/14 0435  NA 140  < > 141 142 143 144  141  K 4.0  < > 4.1 3.0* 3.2* 3.5*  4.2  CL 101  < > 105 111 110 109  110  CO2 17*  --  17*  --   --  12*  BUN 15  < > 15 13 13 14  16   CREATININE 1.12  < > 0.95 1.00 0.90 0.72  0.90  CALCIUM 7.9*  --  7.6*  --   --  7.5*  MG  --   --  1.5  --   --   --   PHOS  --   --  3.3  --   --   --  GLUCOSE 210*  < > 134* 122* 99 91  90  < > = values in this interval not displayed.  CBG (last 3)   Recent Labs  08/17/14 0526 08/17/14 0738 08/17/14 0845  GLUCAP 80 68* 98    Scheduled Meds: . antiseptic oral rinse  7 mL Mouth Rinse QID  . artificial tears  1 application Both Eyes 3 times per day  . chlorhexidine  15 mL Mouth Rinse BID  . folic acid  1 mg Intravenous Daily  . heparin  5,000 Units Subcutaneous 3 times per day  . pantoprazole (PROTONIX) IV  40 mg Intravenous Daily  . thiamine  100 mg Intravenous Daily    Continuous Infusions: . sodium chloride 10 mL/hr at 08/17/14 0221  . cisatracurium (NIMBEX) infusion 1 mcg/kg/min (08/17/14 0800)  . fentaNYL infusion INTRAVENOUS 250 mcg/hr (08/17/14 0849)  . midazolam (VERSED) infusion 6 mg/hr (08/17/14 0800)  . norepinephrine (LEVOPHED) Adult infusion Stopped (08/17/14 0000)    Past Medical History  Diagnosis Date  . CHF  (congestive heart failure)   . Alcohol abuse   . Seizure   . Noncompliance with medication treatment due to intermittent use of medication 02/02/2012  . HTN (hypertension) 02/02/2012  . Alcohol withdrawal seizure 02/02/2012  . Hypertension   . Dysrhythmia     paroxismal atrial fib  . Paroxysmal Atrial Flutter 02/01/2012  . Hepatitis C 02/02/2012    Unsure if he was ever diagnosed with Hep C  . Hepatitis C     Past Surgical History  Procedure Laterality Date  . Forearm reconstruction      Clayton Bibles, MS, [place] Licensed Dietitian Nutritionist Pager: [phone]

## 2014-08-17 NOTE — Progress Notes (Signed)
Note/chart reviewed. Agree with note.   Blessed Cotham RD, LDN, CNSC [phone] Pager [phone] After Hours Pager   

## 2014-08-17 NOTE — Progress Notes (Signed)
eLink Physician-Brief Progress Note Patient Name: Clare Casto DOB: 03-31-1951 MRN: [id number]   Date of Service  08/17/2014  HPI/Events of Note  Low k will rewarm in 2 hr  eICU Interventions  supp k , haldf normal dose with rewarm coming     Intervention Category Intermediate Interventions: Electrolyte abnormality - evaluation and management  Hailyn Zarr J. 08/17/2014, 10:07 PM

## 2014-08-18 ENCOUNTER — Inpatient Hospital Stay (HOSPITAL_COMMUNITY): Payer: Medicare Other

## 2014-08-18 LAB — BASIC METABOLIC PANEL
Anion gap: 13 (ref 5–15)
Anion gap: 12 (ref 5–15)
Anion gap: 12 (ref 5–15)
BUN: 10 mg/dL (ref 6–23)
BUN: 10 mg/dL (ref 6–23)
BUN: 11 mg/dL (ref 6–23)
CALCIUM: 7.4 mg/dL — AB (ref 8.4–10.5)
CALCIUM: 7.6 mg/dL — AB (ref 8.4–10.5)
CHLORIDE: 111 meq/L (ref 96–112)
CHLORIDE: 109 meq/L (ref 96–112)
CO2: 18 meq/L — AB (ref 19–32)
CO2: 17 meq/L — AB (ref 19–32)
CO2: 20 meq/L (ref 19–32)
Calcium: 7.6 mg/dL — ABNORMAL LOW (ref 8.4–10.5)
Chloride: 107 mEq/L (ref 96–112)
Creatinine, Ser: 0.62 mg/dL (ref 0.50–1.35)
Creatinine, Ser: 0.71 mg/dL (ref 0.50–1.35)
Creatinine, Ser: 0.85 mg/dL (ref 0.50–1.35)
GFR calc Af Amer: >90 mL/min (ref >90)
GFR calc Af Amer: >90 mL/min (ref >90)
GFR calc Af Amer: >90 mL/min (ref >90)
GFR calc non Af Amer: >90 mL/min (ref >90)
GFR calc non Af Amer: >90 mL/min (ref >90)
GLUCOSE: 111 mg/dL — AB (ref 70–99)
GLUCOSE: 105 mg/dL — AB (ref 70–99)
GLUCOSE: 139 mg/dL — AB (ref 70–99)
POTASSIUM: 3.7 meq/L (ref 3.7–5.3)
POTASSIUM: 5.6 meq/L — AB (ref 3.7–5.3)
Potassium: 4.3 mEq/L (ref 3.7–5.3)
Sodium: 142 mEq/L (ref 137–147)
Sodium: 138 mEq/L (ref 137–147)
Sodium: 139 mEq/L (ref 137–147)

## 2014-08-18 LAB — CBC WITH DIFFERENTIAL/PLATELET
Basophils Absolute: 0 10*3/uL (ref 0.0–0.1)
Basophils Relative: 0 % (ref 0–1)
EOS PCT: 1 % (ref 0–5)
Eosinophils Absolute: 0.1 10*3/uL (ref 0.0–0.7)
HEMATOCRIT: 40.9 % (ref 39.0–52.0)
HEMOGLOBIN: 14.3 g/dL (ref 13.0–17.0)
LYMPHS ABS: 0.7 10*3/uL (ref 0.7–4.0)
Lymphocytes Relative: 9 % — ABNORMAL LOW (ref 12–46)
MCH: 27.8 pg (ref 26.0–34.0)
MCHC: 35 g/dL (ref 30.0–36.0)
MCV: 79.6 fL (ref 78.0–100.0)
MONOS PCT: 4 % (ref 3–12)
Monocytes Absolute: 0.3 10*3/uL (ref 0.1–1.0)
NEUTROS ABS: 6.3 10*3/uL (ref 1.7–7.7)
Neutrophils Relative %: 86 % — ABNORMAL HIGH (ref 43–77)
Platelets: 144 10*3/uL — ABNORMAL LOW (ref 150–400)
RBC: 5.14 MIL/uL (ref 4.22–5.81)
RDW: 17.6 % — AB (ref 11.5–15.5)
WBC: 7.4 10*3/uL (ref 4.0–10.5)

## 2014-08-18 LAB — GLUCOSE, CAPILLARY
GLUCOSE-CAPILLARY: 96 mg/dL (ref 70–99)
GLUCOSE-CAPILLARY: 138 mg/dL — AB (ref 70–99)
Glucose-Capillary: 102 mg/dL — ABNORMAL HIGH (ref 70–99)
Glucose-Capillary: 86 mg/dL (ref 70–99)
Glucose-Capillary: 95 mg/dL (ref 70–99)

## 2014-08-18 LAB — BLOOD GAS, ARTERIAL
ACID-BASE DEFICIT: 4.5 mmol/L — AB (ref 0.0–2.0)
BICARBONATE: 18.7 meq/L — AB (ref 20.0–24.0)
Drawn by: 369891
FIO2: 0.5 %
O2 Saturation: 99.7 %
PCO2 ART: 26.8 mmHg — AB (ref 35.0–45.0)
PEEP: 5 cmH2O
Patient temperature: 98.6
RATE: 18 resp/min
TCO2: 19.5 mmol/L (ref 0–100)
VT: 650 mL
pH, Arterial: 7.457 — ABNORMAL HIGH (ref 7.350–7.450)
pO2, Arterial: 106 mmHg — ABNORMAL HIGH (ref 80.0–100.0)

## 2014-08-18 LAB — URINE CULTURE
COLONY COUNT: NO GROWTH
Culture: NO GROWTH
Special Requests: NORMAL

## 2014-08-18 LAB — COMPREHENSIVE METABOLIC PANEL
ALBUMIN: 2.7 g/dL — AB (ref 3.5–5.2)
ALT: 135 U/L — ABNORMAL HIGH (ref 0–53)
AST: 187 U/L — ABNORMAL HIGH (ref 0–37)
Alkaline Phosphatase: 56 U/L (ref 39–117)
Anion gap: 12 (ref 5–15)
BUN: 10 mg/dL (ref 6–23)
CO2: 19 mEq/L (ref 19–32)
CREATININE: 0.67 mg/dL (ref 0.50–1.35)
Calcium: 7.7 mg/dL — ABNORMAL LOW (ref 8.4–10.5)
Chloride: 111 mEq/L (ref 96–112)
GFR calc non Af Amer: >90 mL/min (ref >90)
GLUCOSE: 103 mg/dL — AB (ref 70–99)
Potassium: 3.7 mEq/L (ref 3.7–5.3)
Sodium: 142 mEq/L (ref 137–147)
TOTAL PROTEIN: 6 g/dL (ref 6.0–8.3)
Total Bilirubin: 1.7 mg/dL — ABNORMAL HIGH (ref 0.3–1.2)

## 2014-08-18 LAB — PROTIME-INR
INR: 1.17 (ref 0.00–1.49)
PROTHROMBIN TIME: 14.9 s (ref 11.6–15.2)

## 2014-08-18 MED ORDER — PNEUMOCOCCAL VAC POLYVALENT 25 MCG/0.5ML IJ INJ
0.5000 mL | INJECTION | INTRAMUSCULAR | Status: AC | PRN
  Administered 2014-08-31: 0.5 mL via INTRAMUSCULAR
  Filled 2014-08-18: qty 0.5

## 2014-08-18 MED ORDER — FREE WATER
100.0000 mL | Freq: Three times a day (TID) | Status: DC
  Administered 2014-08-18 – 2014-08-20 (×5): 100 mL

## 2014-08-18 MED ORDER — VITAL AF 1.2 CAL PO LIQD
1000.0000 mL | ORAL | Status: DC
  Administered 2014-08-18 – 2014-08-19 (×2): 1000 mL
  Filled 2014-08-18 (×5): qty 1000

## 2014-08-18 MED ORDER — OXEPA PO LIQD
1000.0000 mL | ORAL | Status: DC
  Filled 2014-08-18 (×2): qty 1000

## 2014-08-18 MED ORDER — PRO-STAT SUGAR FREE PO LIQD
30.0000 mL | Freq: Three times a day (TID) | ORAL | Status: DC
  Administered 2014-08-18: 16:00:00
  Administered 2014-08-18 – 2014-08-19 (×4): 30 mL
  Filled 2014-08-18 (×8): qty 30

## 2014-08-18 MED ORDER — DEXTROSE-NACL 5-0.45 % IV SOLN: INTRAVENOUS | Status: DC

## 2014-08-18 NOTE — Progress Notes (Signed)
PULMONARY / CRITICAL CARE MEDICINE  Name: Anthony Mcclure MRN: [id number] DOB: March 04, 1951    ADMISSION DATE:  08/16/2014 CONSULTATION DATE:  08/18/2014  REFERRING MD :  EDP  CHIEF COMPLAINT:  PEA Arrest  INITIAL PRESENTATION: 63 yo admitted 9/3 after cardiac arrest with total down time about 15 minutes before ROSC.  STUDIES / EVENTS:  9/3  Admitted with cardiac arrest, hypothermia started  9/3 CT Head / C-Spine >>> nad 9/4 TTE >>> EF 55% 9/5  Rewarming finished  INTERVAL HISTORY: Rewarming completed.  VITAL SIGNS: Temp:  [90.9 F (32.7 C)-95.9 F (35.5 C)] 95.9 F (35.5 C) (09/05 0800) Pulse Rate:  [57-86] 79 (09/05 0800) Resp:  [14-18] 18 (09/05 0800) BP: (98-197)/(71-117) 105/79 mmHg (09/05 0800) SpO2:  [93 %-100 %] 100 % (09/05 0800) Arterial Line BP: (98-178)/(66-103) 107/70 mmHg (09/05 0800) FiO2 (%):  [40 %-50 %] 40 % (09/05 0725)  HEMODYNAMICS: CVP:  [2 mmHg-6 mmHg] 6 mmHg  VENTILATOR SETTINGS: Vent Mode:  [-] PRVC FiO2 (%):  [40 %-50 %] 40 % Set Rate:  [18 bmp] 18 bmp Vt Set:  [650 mL] 650 mL PEEP:  [5 cmH20] 5 cmH20 Plateau Pressure:  [18 cmH20-19 cmH20] 19 cmH20  INTAKE / OUTPUT: Intake/Output     09/04 0701 - 09/05 0700 09/05 0701 - 09/06 0700   I.V. (mL/kg) 1980.1 (21.7)    NG/GT 100    IV Piggyback     Total Intake(mL/kg) 2080.1 (22.8)    Urine (mL/kg/hr) 1675 (0.8)    Emesis/NG output 200 (0.1)    Other     Total Output 1875     Net +205.1          Stool Occurrence 2 x     PHYSICAL EXAMINATION: General: paralyzed Neuro: paralyzed and sedated HEENT: [place]/AT. Pupils nonreactive 2 mm Cardiovascular: RRR, no M/R/G.  Lungs:CTA Abdomen: BS hypoactive, soft, NT/ND.  Musculoskeletal: No gross deformities, no edema Skin: Intact, cool, no rashes.  LABS:  CBC  Recent Labs Lab 08/16/14 1900  08/16/14 2250  08/17/14 0241 08/17/14 0435 08/18/14 0419  WBC 10.9*  --  10.9*  --   --   --  7.4  HGB 14.1  < > 13.4  < > 15.3 15.6 14.3  HCT 41.2  < >  39.4  < > 45.0 46.0 40.9  PLT 200  --  182  --   --   --  144*  < > = values in this interval not displayed.  Coag's  Recent Labs Lab 08/16/14 2250 08/17/14 0435  APTT 32 37  INR 1.24 1.31   BMET  Recent Labs Lab 08/17/14 2116 08/18/14 0131 08/18/14 0419  NA 142 142 142  K 3.4* 3.7 3.7  CL 111 111 111  CO2 16* 18* 19  BUN 10 10 10   CREATININE 0.62 0.62 0.67  GLUCOSE 97 111* 103*   Electrolytes  Recent Labs Lab 08/16/14 2250  08/17/14 2116 08/18/14 0131 08/18/14 0419  CALCIUM 7.6*  < > 7.5* 7.6* 7.7*  MG 1.5  --   --   --   --   PHOS 3.3  --   --   --   --   < > = values in this interval not displayed. Sepsis Markers  Recent Labs Lab 08/16/14 2250 08/17/14 0335 08/17/14 1000  LATICACIDVEN 4.3* 2.1 1.7   ABG  Recent Labs Lab 08/16/14 2048 08/17/14 0059 08/18/14 0452  PHART 7.257* 7.369 7.457*  PCO2ART 35.0 24.2* 26.8*  PO2ART 98.0  156.0* 106.0*   Liver Enzymes  Recent Labs Lab 08/16/14 2250 08/18/14 0419  AST 291* 187*  ALT 151* 135*  ALKPHOS 66 56  BILITOT 0.6 1.7*  ALBUMIN 3.2* 2.7*   Cardiac Enzymes  Recent Labs Lab 08/16/14 2250 08/17/14 0330 08/17/14 0915  TROPONINI <0.30 <0.30 <0.30   Glucose  Recent Labs Lab 08/17/14 1359 08/17/14 1619 08/17/14 1754 08/17/14 1926 08/17/14 2330 08/18/14 0413  GLUCAP 83 76 87 86 81 96   IMAGING:  Ct Head Wo Contrast  08/16/2014   CLINICAL DATA:  CPR  EXAM: CT HEAD WITHOUT CONTRAST  CT MAXILLOFACIAL WITHOUT CONTRAST  TECHNIQUE: Multidetector CT imaging of the head and cervical spine structures were performed using the standard protocol without intravenous contrast. Multiplanar CT image reconstructions of the maxillofacial structures were also generated.  COMPARISON:  10/24/2010 and 06/14/2009  FINDINGS: CT HEAD FINDINGS  Stable left frontal soft tissue lipoma over the periosteum.  Global atrophy and chronic ischemic changes are stable. No mass effect, midline shift, or acute intracranial  hemorrhage. Mastoid air cells are clear. Minimal mucosal thickening in the ethmoid air cells. Old fracture of the medial wall of the left orbit.  CT CERVICAL SPINE FINDINGS  No acute fracture. No dislocation. 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 There is multilevel spinal stenosis. No obvious spinal hematoma. No obvious soft tissue injury. Endotracheal tube is in place.  IMPRESSION: No acute intracranial pathology.  No evidence of acute cervical spine injury. Advanced degenerative changes are noted.   Electronically Signed   By: Maryclare Bean M.D.   On: 08/16/2014 19:59   Ct Cervical Spine Wo Contrast  08/16/2014   CLINICAL DATA:  CPR  EXAM: CT HEAD WITHOUT CONTRAST  CT MAXILLOFACIAL WITHOUT CONTRAST  TECHNIQUE: Multidetector CT imaging of the head and cervical spine structures were performed using the standard protocol without intravenous contrast. Multiplanar CT image reconstructions of the maxillofacial structures were also generated.  COMPARISON:  10/24/2010 and 06/14/2009  FINDINGS: CT HEAD FINDINGS  Stable left frontal soft tissue lipoma over the periosteum.  Global atrophy and chronic ischemic changes are stable. No mass effect, midline shift, or acute intracranial hemorrhage. Mastoid air cells are clear. Minimal mucosal thickening in the ethmoid air cells. Old fracture of the medial wall of the left orbit.  CT CERVICAL SPINE FINDINGS  No acute fracture. No dislocation. 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 There is multilevel spinal stenosis. No obvious spinal hematoma. No obvious soft tissue injury. Endotracheal tube is in place.  IMPRESSION: No acute intracranial pathology.  No evidence of acute cervical spine injury. Advanced degenerative changes are noted.   Electronically Signed   By: Maryclare Bean M.D.   On: 08/16/2014 19:59   Dg Chest Port 1 View  08/18/2014   CLINICAL DATA:  Endotracheal to  EXAM: PORTABLE CHEST - 1 VIEW   COMPARISON:  Yesterday  FINDINGS: Tubular device is stable. No pneumothorax. Normal heart size. Increasing left basilar airspace disease.  IMPRESSION: Increasing left basilar airspace disease.   Electronically Signed   By: Maryclare Bean M.D.   On: 08/18/2014 07:34   Portable Chest Xray In Am  08/17/2014   CLINICAL DATA:  Reassess support tube placement.  EXAM: PORTABLE CHEST - 1 VIEW  COMPARISON:  Portable chest x-ray of August 16, 2014  FINDINGS: The lungs are reasonably well inflated and clear. The cardiac silhouette is mildly enlarged though stable. A external pacing -defibrillator pads are  present. The pulmonary vascularity is not engorged. The endotracheal tube tip lies 3.9 cm above the crotch of the carina. The esophagogastric tube tip lies in the gastric cardia. The proximal port likely lies at or above the GE junction. The right internal jugular venous catheter tip lies in the midportion of the SVC.  IMPRESSION: 1. There is no evidence of pneumonia nor pulmonary edema. 2. The endotracheal tube and internal jugular catheter are in appropriate position. Advancement of the nasogastric tube by 5-10 cm is recommended to assure that the proximal port is below the GE junction.   Electronically Signed   By: David  Martinique   On: 08/17/2014 07:25   Dg Chest Port 1 View  08/16/2014   CLINICAL DATA:  line placement  EXAM: PORTABLE CHEST - 1 VIEW  COMPARISON:  08/16/2014  FINDINGS: Right IJ catheter tip projects over the mid SVC. Endotracheal tube tip projects 3.7 cm proximal to the carina. NG tube descends below the level of the image. Other wires and tubes are presumably external. Left chest external support devices obscure detailed evaluation. Heart size enlarged. Mediastinal contours otherwise within normal range. No definite consolidation, pleural effusion, or pneumothorax. Bilateral acromioclavicular DJD. No interval osseous change.  IMPRESSION: Support devices are appropriately positioned as above. No  pneumothorax.  Mild cardiac enlargement.  No focal consolidation   Electronically Signed   By: Carlos Levering M.D.   On: 08/16/2014 23:03   Dg Chest Portable 1 View  08/16/2014   CLINICAL DATA:  Acute respiratory failure.  Atrial fibrillation.  EXAM: PORTABLE CHEST - 1 VIEW  COMPARISON:  02/14/2014  FINDINGS: Endotracheal tube is seen with tip approximately 6 cm above the carina.  Both lungs are clear. No evidence of pleural effusion. Mild cardiomegaly is stable.  IMPRESSION: Endotracheal tube in satisfactory position. No acute lung disease. Stable mild cardiomegaly.   Electronically Signed   By: Earle Gell M.D.   On: 08/16/2014 19:36   ASSESSMENT / PLAN:  PULMONARY A: Acute respiratory failure in the setting of PEA cardiac arrest, intubated 9/3 P:   Goal pH>7.30, SpO2>92 Continuous mechanical support VAP bundle Daily SBT  CARDIOVASCULAR A:  PEA arrest, downtime 30 minutes before ROSC Hx PAF, HTN, chronic diastolic heart failure  P:  Goal MAP > 65 Hold outpatient Lopressor, Verapamil D/c Levophed Hydralazine PRN  RENAL A:   AKI P:   Trend BMP D5 1/2NS@50   GASTROINTESTINAL A:   Nutrition GI Px P:   NPO Start TF per Nutritionist Protonix  HEMATOLOGIC A:   VTE Prophylaxis P:  Trend CBC Heparin  INFECTIOUS A:   No active issues P:   No intervention required   ENDOCRINE A:   Hypoglycemia-improving  P:   CBG  NEUROLOGIC A:   Acute metabolic encephalopathy Possible anoxia Hx seizures, not on outpatient medications Hx alcohol abuse, intoxication P:   RASS goal 0 to -1 D/c Nimbex Versed / Fentanyl gtt Daily WUA Thiamine / Folate EEG  Tammy Parrett NP-C  [facility]  [phone]   I have personally obtained history, examined patient, evaluated and interpreted laboratory and imaging results, reviewed medical records, formulated assessment / plan and placed orders.  CRITICAL CARE:  The patient is critically ill with multiple  organ systems failure and requires high complexity decision making for assessment and support, frequent evaluation and titration of therapies, application of advanced monitoring technologies and extensive interpretation of multiple databases. Critical Care Time devoted to patient care services described in this note  is 35 minutes.   Doree Fudge, MD [facility] [place] Pager: [phone]  08/18/2014, 11:45 AM

## 2014-08-18 NOTE — Progress Notes (Signed)
NUTRITION FOLLOW UP  Intervention:    Initiate TF via OGT with Vital AF 1.2 at 25 ml/h, increase by 10 ml every 4 hours to goal rate of 55 ml/h with Prostat 30 ml TID to provide 1884 kcals, 144 gm protein, 1071 ml free water daily.  Nutrition Dx:   Inadequate oral intake related to inability to eat as evidenced by NPO status, ongoing.  Goal:   Intake to meet >90% of estimated nutrition needs. Unmet.  Monitor:   TF tolerance/adequacy, weight trend, labs, vent status.  Assessment:   [name] brought to ED on 9/3 after he suffered an out of hospital cardiac arrest. In ED, pt was comatose and was intubated for respiratory failure. PCCM was consulted for admission and initiation of hypothermia protocol.  Received MD Consult for TF initiation and management. S/P [place] hypothermia protocol. Rewarming has been completed.  Patient remains intubated on ventilator support MV: 11.5 L/min Temp (24hrs), Avg:93 F (33.9 C), Min:91.2 F (32.9 C), Max:97.5 F (36.4 C)  Propofol: none   Height: Ht Readings from Last 1 Encounters:  08/18/14 6' (1.829 m)    Weight Status:   Wt Readings from Last 1 Encounters:  08/16/14 201 lb 4.8 oz (91.31 kg)    Body mass index is 27.3 kg/(m^2).   Re-estimated needs:  Kcal: 1904 Protein: 135-145 gm Fluid: 2 L  Skin: intact  Diet Order: NPO   Intake/Output Summary (Last 24 hours) at 08/18/14 1300 Last data filed at 08/18/14 1100  Gross per 24 hour  Intake 2132.87 ml  Output   1495 ml  Net 637.87 ml    Last BM: 9/4   Labs:   Recent Labs Lab 08/16/14 2250  08/18/14 0131 08/18/14 0419 08/18/14 1000  NA 141  < > 142 142 138  K 4.1  < > 3.7 3.7 5.6*  CL 105  < > 111 111 109  CO2 17*  < > 18* 19 17*  BUN 15  < > 10 10 10   CREATININE 0.95  < > 0.62 0.67 0.71  CALCIUM 7.6*  < > 7.6* 7.7* 7.4*  MG 1.5  --   --   --   --   PHOS 3.3  --   --   --   --   GLUCOSE 134*  < > 111* 103* 105*  < > = values in this interval not  displayed.  CBG (last 3)   Recent Labs  08/18/14 0413 08/18/14 0822 08/18/14 1159  GLUCAP 96 102* 86    Scheduled Meds: . antiseptic oral rinse  7 mL Mouth Rinse QID  . artificial tears  1 application Both Eyes 3 times per day  . chlorhexidine  15 mL Mouth Rinse BID  . folic acid  1 mg Intravenous Daily  . heparin  5,000 Units Subcutaneous 3 times per day  . pantoprazole (PROTONIX) IV  40 mg Intravenous Daily  . thiamine  100 mg Intravenous Daily    Continuous Infusions: . sodium chloride 10 mL/hr at 08/17/14 0221  . dextrose 5 % and 0.45% NaCl 50 mL/hr (08/18/14 1215)  . fentaNYL infusion INTRAVENOUS 100 mcg/hr (08/18/14 1000)  . midazolam (VERSED) infusion 8 mg/hr (08/18/14 0900)     Molli Barrows, RD, LDN, [place] Pager [phone] After Hours Pager [phone]
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
## 2014-08-18 NOTE — Progress Notes (Signed)
Bedside adult EEG completed, results pending.

## 2014-08-18 NOTE — Progress Notes (Signed)
ABG    Component Value Date/Time   PHART 7.457* 08/18/2014 0452   PCO2ART 26.8* 08/18/2014 0452   PO2ART 106.0* 08/18/2014 0452   HCO3 18.7* 08/18/2014 0452   TCO2 19.5 08/18/2014 0452   ACIDBASEDEF 4.5* 08/18/2014 0452   O2SAT 99.7 08/18/2014 0452
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
## 2014-08-19 ENCOUNTER — Inpatient Hospital Stay (HOSPITAL_COMMUNITY): Payer: Medicare Other

## 2014-08-19 DIAGNOSIS — G8250 Quadriplegia, unspecified: Secondary | ICD-10-CM | POA: Diagnosis present

## 2014-08-19 DIAGNOSIS — I10 Essential (primary) hypertension: Secondary | ICD-10-CM

## 2014-08-19 LAB — PHOSPHORUS: Phosphorus: 3.1 mg/dL (ref 2.3–4.6)

## 2014-08-19 LAB — COMPREHENSIVE METABOLIC PANEL
ALT: 110 U/L — ABNORMAL HIGH (ref 0–53)
ANION GAP: 11 (ref 5–15)
AST: 120 U/L — ABNORMAL HIGH (ref 0–37)
Albumin: 2.7 g/dL — ABNORMAL LOW (ref 3.5–5.2)
Alkaline Phosphatase: 64 U/L (ref 39–117)
BUN: 16 mg/dL (ref 6–23)
CO2: 22 mEq/L (ref 19–32)
Calcium: 7.5 mg/dL — ABNORMAL LOW (ref 8.4–10.5)
Chloride: 108 mEq/L (ref 96–112)
Creatinine, Ser: 0.92 mg/dL (ref 0.50–1.35)
GFR calc Af Amer: >90 mL/min (ref >90)
GFR calc non Af Amer: 88 mL/min — ABNORMAL LOW (ref >90)
Glucose, Bld: 153 mg/dL — ABNORMAL HIGH (ref 70–99)
Potassium: 4.2 mEq/L (ref 3.7–5.3)
Sodium: 141 mEq/L (ref 137–147)
TOTAL PROTEIN: 6.4 g/dL (ref 6.0–8.3)
Total Bilirubin: 1.1 mg/dL (ref 0.3–1.2)

## 2014-08-19 LAB — GLUCOSE, CAPILLARY
GLUCOSE-CAPILLARY: 131 mg/dL — AB (ref 70–99)
Glucose-Capillary: 137 mg/dL — ABNORMAL HIGH (ref 70–99)
Glucose-Capillary: 152 mg/dL — ABNORMAL HIGH (ref 70–99)
Glucose-Capillary: 137 mg/dL — ABNORMAL HIGH (ref 70–99)
Glucose-Capillary: 125 mg/dL — ABNORMAL HIGH (ref 70–99)

## 2014-08-19 LAB — CBC
HCT: 40.8 % (ref 39.0–52.0)
HEMOGLOBIN: 13.8 g/dL (ref 13.0–17.0)
MCH: 27.6 pg (ref 26.0–34.0)
MCHC: 33.8 g/dL (ref 30.0–36.0)
MCV: 81.6 fL (ref 78.0–100.0)
Platelets: 134 10*3/uL — ABNORMAL LOW (ref 150–400)
RBC: 5 MIL/uL (ref 4.22–5.81)
RDW: 18.2 % — ABNORMAL HIGH (ref 11.5–15.5)
WBC: 12.6 10*3/uL — ABNORMAL HIGH (ref 4.0–10.5)

## 2014-08-19 LAB — MAGNESIUM: Magnesium: 1.5 mg/dL (ref 1.5–2.5)

## 2014-08-19 MED ORDER — ACETAMINOPHEN 325 MG PO TABS
650.0000 mg | ORAL_TABLET | Freq: Four times a day (QID) | ORAL | Status: DC | PRN
  Administered 2014-08-19 – 2014-08-24 (×4): 650 mg via ORAL
  Filled 2014-08-19 (×4): qty 2

## 2014-08-19 MED ORDER — FENTANYL CITRATE 0.05 MG/ML IJ SOLN
25.0000 ug | INTRAMUSCULAR | Status: DC | PRN
  Administered 2014-08-19: 100 ug via INTRAVENOUS
  Administered 2014-08-19: 50 ug via INTRAVENOUS
  Administered 2014-08-19 – 2014-08-20 (×5): 100 ug via INTRAVENOUS
  Filled 2014-08-19 (×7): qty 2

## 2014-08-19 MED ORDER — MAGNESIUM SULFATE 40 MG/ML IJ SOLN
2.0000 g | Freq: Once | INTRAMUSCULAR | Status: AC
  Administered 2014-08-19: 2 g via INTRAVENOUS
  Filled 2014-08-19: qty 50

## 2014-08-19 MED ORDER — MIDAZOLAM HCL 2 MG/2ML IJ SOLN
4.0000 mg | Freq: Once | INTRAMUSCULAR | Status: AC
  Administered 2014-08-19: 4 mg via INTRAVENOUS
  Filled 2014-08-19: qty 4

## 2014-08-19 MED ORDER — GADOBENATE DIMEGLUMINE 529 MG/ML IV SOLN
20.0000 mL | Freq: Once | INTRAVENOUS | Status: AC | PRN
  Administered 2014-08-19: 20 mL via INTRAVENOUS

## 2014-08-19 MED ORDER — SODIUM CHLORIDE 0.9 % IV SOLN
3.0000 g | Freq: Four times a day (QID) | INTRAVENOUS | Status: DC
  Administered 2014-08-19 – 2014-08-22 (×12): 3 g via INTRAVENOUS
  Filled 2014-08-19 (×16): qty 3

## 2014-08-19 NOTE — Progress Notes (Signed)
Sputum sample obtained (moderate amount of thin, yellow, green) and tubed to lab by RT.

## 2014-08-19 NOTE — Progress Notes (Signed)
eLink Physician-Brief Progress Note Patient Name: Anthony Mcclure DOB: 28-Mar-1951 MRN: [id number]   Date of Service  08/19/2014  HPI/Events of Note  Pt in MRI.  Agitated.  Already given fentanyl.  eICU Interventions  Will give 4 mg versed IV x one for procedure.     Intervention Category Major Interventions: Other:  Annise Boran 08/19/2014, 3:40 PM

## 2014-08-19 NOTE — Progress Notes (Signed)
45ml of Versed and 160ml of Fentanyl wasted in sink with [place].
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
## 2014-08-19 NOTE — Progress Notes (Signed)
PULMONARY / CRITICAL CARE MEDICINE  Name: Anthony Mcclure MRN: [id number] DOB: 1951-12-11    ADMISSION DATE:  08/16/2014 CONSULTATION DATE:  08/19/2014  REFERRING MD :  EDP  CHIEF COMPLAINT:  PEA Arrest  INITIAL PRESENTATION: 63 yo admitted 9/3 after cardiac arrest with total down time about 15 minutes before ROSC.  STUDIES / EVENTS:  9/3  Admitted with cardiac arrest, hypothermia started  9/3  CT Head / C-Spine >>> nad 9/4  TTE >>> EF 55% 9/5  Rewarming finished 9/5  EEG >>> nonspecific cerebral dsyfxn, no seizures  9/6  Unable to move upper / lower ( excep feet ) extremities, MRI brain / s-spine ordered  INTERVAL HISTORY: Fevers overnight, increasing leukocytosis. Responding appropriately to commands but appears to be unable to move upper / lower extremities.  Tolerating SBT well.  VITAL SIGNS: Temp:  [97.5 F (36.4 C)-101.6 F (38.7 C)] 100.9 F (38.3 C) (09/06 0806) Pulse Rate:  [88-114] 110 (09/06 0800) Resp:  [0-23] 15 (09/06 0800) BP: (96-170)/(70-113) 164/100 mmHg (09/06 0800) SpO2:  [98 %-100 %] 100 % (09/06 0800) Arterial Line BP: (99-170)/(63-94) 118/72 mmHg (09/06 0900) FiO2 (%):  [40 %] 40 % (09/06 0733) Weight:  [92.4 kg (203 lb 11.3 oz)] 92.4 kg (203 lb 11.3 oz) (09/06 0233)  HEMODYNAMICS: CVP:  [2 mmHg-9 mmHg] 4 mmHg  VENTILATOR SETTINGS: Vent Mode:  [-] PSV;CPAP FiO2 (%):  [40 %] 40 % Set Rate:  [18 bmp] 18 bmp Vt Set:  [550 mL-650 mL] 550 mL PEEP:  [5 cmH20] 5 cmH20 Pressure Support:  [5 cmH20] 5 cmH20 Plateau Pressure:  [10 cmH20-25 cmH20] 19 cmH20  INTAKE / OUTPUT: Intake/Output     09/05 0701 - 09/06 0700 09/06 0701 - 09/07 0700   I.V. (mL/kg) 1059.7 (11.5) 20 (0.2)   NG/GT 1153.8 100   Total Intake(mL/kg) 2213.5 (24) 120 (1.3)   Urine (mL/kg/hr) 1075 (0.5)    Emesis/NG output     Total Output 1075     Net +1138.5 +120         PHYSICAL EXAMINATION: General: No distress Neuro: Alert, following simple commands.  HEENT: OETT Cardiovascular:  RRR, no murmurs Lungs: Bilateral air entry, few rhonchi Abdomen: Soft abdomen, hypoactive sounds Musculoskeletal: No gross deformities, no edema, moving feet, otherwise unable to move UE / LE Skin: Intact, cool, no rashes.  LABS:  CBC  Recent Labs Lab 08/16/14 2250  08/17/14 0435 08/18/14 0419 08/19/14 0441  WBC 10.9*  --   --  7.4 12.6*  HGB 13.4  < > 15.6 14.3 13.8  HCT 39.4  < > 46.0 40.9 40.8  PLT 182  --   --  144* 134*  < > = values in this interval not displayed.  Coag's  Recent Labs Lab 08/16/14 2250 08/17/14 0435 08/18/14 0930  APTT 32 37  --   INR 1.24 1.31 1.17   BMET  Recent Labs Lab 08/18/14 1000 08/18/14 1707 08/19/14 0441  NA 138 139 141  K 5.6* 4.3 4.2  CL 109 107 108  CO2 17* 20 22  BUN 10 11 16   CREATININE 0.71 0.85 0.92  GLUCOSE 105* 139* 153*   Electrolytes  Recent Labs Lab 08/16/14 2250  08/18/14 1000 08/18/14 1707 08/19/14 0441  CALCIUM 7.6*  < > 7.4* 7.6* 7.5*  MG 1.5  --   --   --  1.5  PHOS 3.3  --   --   --  3.1  < > = values in this interval  not displayed. Sepsis Markers  Recent Labs Lab 08/16/14 2250 08/17/14 0335 08/17/14 1000  LATICACIDVEN 4.3* 2.1 1.7   ABG  Recent Labs Lab 08/16/14 2048 08/17/14 0059 08/18/14 0452  PHART 7.257* 7.369 7.457*  PCO2ART 35.0 24.2* 26.8*  PO2ART 98.0 156.0* 106.0*   Liver Enzymes  Recent Labs Lab 08/16/14 2250 08/18/14 0419 08/19/14 0441  AST 291* 187* 120*  ALT 151* 135* 110*  ALKPHOS 66 56 64  BILITOT 0.6 1.7* 1.1  ALBUMIN 3.2* 2.7* 2.7*   Cardiac Enzymes  Recent Labs Lab 08/16/14 2250 08/17/14 0330 08/17/14 0915  TROPONINI <0.30 <0.30 <0.30   Glucose  Recent Labs Lab 08/18/14 0822 08/18/14 1159 08/18/14 1549 08/18/14 2022 08/18/14 2348 08/19/14 0523  GLUCAP 102* 86 95 138* 152* 137*   IMAGING:  Dg Chest Port 1 View  08/19/2014   CLINICAL DATA:  Intubated. Congestive heart failure. Alcohol abuse.  EXAM: PORTABLE CHEST - 1 VIEW  COMPARISON:   One day prior  FINDINGS: Endotracheal tube unchanged with tip 3.5 cm above carina. Nasogastric tube extends beyond the inferior aspect of the film. Right internal jugular line tip mid SVC.  Mild cardiomegaly. No pleural effusion or pneumothorax. Improved to resolved left base airspace disease. Developing right base opacity.  IMPRESSION: Decreased to resolved left base airspace disease. New developing right base airspace disease. Favor shifting atelectasis. Early right-sided infection cannot be excluded.  Cardiomegaly without congestive failure.   Electronically Signed   By: Abigail Miyamoto M.D.   On: 08/19/2014 07:38   Dg Chest Port 1 View  08/18/2014   CLINICAL DATA:  Endotracheal to  EXAM: PORTABLE CHEST - 1 VIEW  COMPARISON:  Yesterday  FINDINGS: Tubular device is stable. No pneumothorax. Normal heart size. Increasing left basilar airspace disease.  IMPRESSION: Increasing left basilar airspace disease.   Electronically Signed   By: Maryclare Bean M.D.   On: 08/18/2014 07:34   ASSESSMENT / PLAN:  PULMONARY A: Acute respiratory failure in the setting of PEA cardiac arrest, intubated 9/3 P:   Goal pH>7.30, SpO2>92 Continuous mechanical support VAP bundle Daily SBT, consider for extubation after MRI is done Trend CXR / ABG  CARDIOVASCULAR A:  PEA arrest, downtime 30 minutes before ROSC Hx PAF, HTN, chronic diastolic heart failure  P:  Goal MAP > 65 Hold outpatient Lopressor, Verapamil Hydralazine PRN  RENAL A:   AKI Hypomagnesemia  P:   Trend BMP Mg 2 Free water 100 q8h  GASTROINTESTINAL A:   Nutrition GI Px P:   NPO TF  Protonix  HEMATOLOGIC A:   VTE Prophylaxis P:  Trend CBC Heparin  INFECTIOUS A:   Possible aspiration pneumonia P:   Check blood / respiratory cx Start Unasyn 9/6 >>  ENDOCRINE A:   Hypoglycemia-improving  P:   CBG  NEUROLOGIC A:   Acute metabolic encephalopathy Possible anoxia Unable to move extremities, CVA? Spinal cord infarct? Trauma?  Residual paralytic effect? Hx seizures, not on outpatient medications Hx alcohol abuse, intoxication P:   RASS goal 0 to -1 Fentanyl PRN Thiamine / Folate Place c-spine collar Check MRI brain / c spine   PARRETT,TAMMY,NP  Pulmonary and [place] Pager: [phone]  08/19/2014, 10:01 AM  I have personally obtained history, examined patient, evaluated and interpreted laboratory and imaging results, reviewed medical records, formulated assessment / plan and placed orders.  CRITICAL CARE:  The patient is critically ill with multiple organ systems failure and requires high complexity decision making for assessment and support,  frequent evaluation and titration of therapies, application of advanced monitoring technologies and extensive interpretation of multiple databases. Critical Care Time devoted to patient care services described in this note is 35 minutes.   Doree Fudge, MD Pulmonary and [place] Pager: [phone]  08/19/2014, 11:27 AM

## 2014-08-19 NOTE — Progress Notes (Signed)
Orthopedic Tech Progress Note Patient Details:  Anthony Mcclure 10/25/1951 [id number] Fit and applied Aspen cervical collar to pt. Ortho Devices Type of Ortho Device: Rigid collar Ortho Device/Splint Interventions: Application   Darrol Poke 08/19/2014, 12:35 PM

## 2014-08-19 NOTE — Progress Notes (Signed)
eLink Physician-Brief Progress Note Patient Name: Anthony Mcclure DOB: 1951-03-24 MRN: [id number]   Date of Service  08/19/2014  HPI/Events of Note  Pt has been rewarmed. Appears to be fully awake. Remains intubated  eICU Interventions  PRN fentanyl ordered     Intervention Category Intermediate Interventions: Other:  Merton Border 08/19/2014, 12:32 AM

## 2014-08-19 NOTE — Consult Note (Signed)
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  HPI:                                                                                                                                          Anthony Mcclure is an 63 y.o. male with a history of congestive heart failure, alcohol abuse, alcohol withdrawal seizures, hypertension and paroxysmal atrial flutter not on anticoagulation, who suffered a cardiac arrest on 08/16/2014. Patient underwent thermal cooling protocol following admission. On rewarming and regaining consciousness it was noted that patient was unable to move extremities. MRI of his brain was unremarkable with no acute abnormality. MRI of cervical spine however showed abnormal increased T2 signal in the central eschar there is a cord extending from the skull base through the C3-4 level, most severely at C1. There was no enhancement of cord lesion with contrast. Degenerative changes with pannus posterior to the odontoid with likely chronic erosion tablet was noted. Low grade enhancement surrounding pannus also noted. Patient had an EEG on 08/18/2014 generalized slowing consistent with encephalopathic state, but was otherwise unremarkable.  Past Medical History  Diagnosis Date  . CHF (congestive heart failure)   . Alcohol abuse   . Seizure   . Noncompliance with medication treatment due to intermittent use of medication 02/02/2012  . HTN (hypertension) 02/02/2012  . Alcohol withdrawal seizure 02/02/2012  . Hypertension   . Dysrhythmia     paroxismal atrial fib  . Paroxysmal Atrial Flutter 02/01/2012  . Hepatitis C 02/02/2012    Unsure if he was ever diagnosed with Hep C  . Hepatitis C     Past Surgical History  Procedure Laterality Date  . Forearm reconstruction      Family History  Problem Relation Age of Onset  . Heart disease Father   . Alcohol abuse Father   . Alcohol abuse Brother   . Diabetes type II Mother     Social  History:  reports that he has never smoked. He has never used smokeless tobacco. He reports that he drinks alcohol. He reports that he uses illicit drugs ("Crack" cocaine).  No Known Allergies  MEDICATIONS:                                                                                                                     I have reviewed the patient's current medications.  ROS:                                                                                                                                       History obtained from chart review  General ROS: negative for - chills, fatigue, fever, night sweats, weight gain or weight loss Psychological ROS: negative for - behavioral disorder, hallucinations, memory difficulties, mood swings or suicidal ideation Ophthalmic ROS: negative for - blurry vision, double vision, eye pain or loss of vision ENT ROS: negative for - epistaxis, nasal discharge, oral lesions, sore throat, tinnitus or vertigo Allergy and Immunology ROS: negative for - hives or itchy/watery eyes Hematological and Lymphatic ROS: negative for - bleeding problems, bruising or swollen lymph nodes Endocrine ROS: negative for - galactorrhea, hair pattern changes, polydipsia/polyuria or temperature intolerance Respiratory ROS: As noted in history of present illness Cardiovascular ROS: As noted in history of present illness Gastrointestinal ROS: negative for - abdominal pain, diarrhea, hematemesis, nausea/vomiting or stool incontinence Genito-Urinary ROS: negative for - dysuria, hematuria, incontinence or urinary frequency/urgency Musculoskeletal ROS: negative for - joint swelling or muscular weakness Neurological ROS: as noted in HPI Dermatological ROS: negative for rash and skin lesion changes   Blood pressure 175/73, pulse 103, temperature 100.6 F (38.1 C), temperature source Core (Comment), resp. rate 25, height 6' (1.829 m), weight 92.4 kg (203 lb 11.3 oz), SpO2  92.00%.   Neurologic Examination:                                                                                                      Mental Status: Patient was intubated and on mechanical ventilator. Alert, and in no acute distress. Able to follow commands with eye movements and facial movements. Cranial Nerves: II-Visual fields were normal. III/IV/VI-Pupils were equal and reacted. Extraocular movements were full and conjugate.    V/VII-no facial numbness and no facial weakness. VIII-normal. Patient had flaccid quadriplegia with occasional spasm lower extremities, right greater than left. Deep tendon reflexes were 1+ in upper extremities and 2+ at the knees. Plantar response on the right was extensor on the left mute.  Lab Results  Component Value Date/Time   CHOL 210* 02/15/2013  5:35 PM    Results for orders placed during the hospital encounter of 08/16/14 (from the past 48 hour(s))  BASIC METABOLIC PANEL     Status: Abnormal   Collection Time    08/17/14  9:16 PM      Result Value Ref Range   Sodium  142  137 - 147 mEq/L   Potassium 3.4 (*) 3.7 - 5.3 mEq/L   Comment: DELTA CHECK NOTED   Chloride 111  96 - 112 mEq/L   CO2 16 (*) 19 - 32 mEq/L   Glucose, Bld 97  70 - 99 mg/dL   BUN 10  6 - 23 mg/dL   Creatinine, Ser 0.62  0.50 - 1.35 mg/dL   Calcium 7.5 (*) 8.4 - 10.5 mg/dL   GFR calc non Af Amer >90  >90 mL/min   GFR calc Af Amer >90  >90 mL/min   Comment: (NOTE)     The eGFR has been calculated using the CKD EPI equation.     This calculation has not been validated in all clinical situations.     eGFR's persistently <90 mL/min signify possible Chronic Kidney     Disease.   Anion gap 15  5 - 15  GLUCOSE, CAPILLARY     Status: None   Collection Time    08/17/14 11:30 PM      Result Value Ref Range   Glucose-Capillary 81  70 - 99 mg/dL  BASIC METABOLIC PANEL     Status: Abnormal   Collection Time    08/18/14  1:31 AM      Result Value Ref Range   Sodium 142  137 -  147 mEq/L   Potassium 3.7  3.7 - 5.3 mEq/L   Chloride 111  96 - 112 mEq/L   CO2 18 (*) 19 - 32 mEq/L   Glucose, Bld 111 (*) 70 - 99 mg/dL   BUN 10  6 - 23 mg/dL   Creatinine, Ser 0.62  0.50 - 1.35 mg/dL   Calcium 7.6 (*) 8.4 - 10.5 mg/dL   GFR calc non Af Amer >90  >90 mL/min   GFR calc Af Amer >90  >90 mL/min   Comment: (NOTE)     The eGFR has been calculated using the CKD EPI equation.     This calculation has not been validated in all clinical situations.     eGFR's persistently <90 mL/min signify possible Chronic Kidney     Disease.   Anion gap 13  5 - 15  GLUCOSE, CAPILLARY     Status: None   Collection Time    08/18/14  4:13 AM      Result Value Ref Range   Glucose-Capillary 96  70 - 99 mg/dL  COMPREHENSIVE METABOLIC PANEL     Status: Abnormal   Collection Time    08/18/14  4:19 AM      Result Value Ref Range   Sodium 142  137 - 147 mEq/L   Potassium 3.7  3.7 - 5.3 mEq/L   Chloride 111  96 - 112 mEq/L   CO2 19  19 - 32 mEq/L   Glucose, Bld 103 (*) 70 - 99 mg/dL   BUN 10  6 - 23 mg/dL   Creatinine, Ser 0.67  0.50 - 1.35 mg/dL   Calcium 7.7 (*) 8.4 - 10.5 mg/dL   Total Protein 6.0  6.0 - 8.3 g/dL   Albumin 2.7 (*) 3.5 - 5.2 g/dL   AST 187 (*) 0 - 37 U/L   ALT 135 (*) 0 - 53 U/L   Alkaline Phosphatase 56  39 - 117 U/L   Total Bilirubin 1.7 (*) 0.3 - 1.2 mg/dL   GFR calc non Af Amer >90  >90 mL/min   GFR calc Af Amer >90  >90 mL/min   Comment: (  NOTE)     The eGFR has been calculated using the CKD EPI equation.     This calculation has not been validated in all clinical situations.     eGFR's persistently <90 mL/min signify possible Chronic Kidney     Disease.   Anion gap 12  5 - 15  CBC WITH DIFFERENTIAL     Status: Abnormal   Collection Time    08/18/14  4:19 AM      Result Value Ref Range   WBC 7.4  4.0 - 10.5 K/uL   RBC 5.14  4.22 - 5.81 MIL/uL   Hemoglobin 14.3  13.0 - 17.0 g/dL   HCT 40.9  39.0 - 52.0 %   MCV 79.6  78.0 - 100.0 fL   MCH 27.8  26.0 - 34.0  pg   MCHC 35.0  30.0 - 36.0 g/dL   RDW 17.6 (*) 11.5 - 15.5 %   Platelets 144 (*) 150 - 400 K/uL   Neutrophils Relative % 86 (*) 43 - 77 %   Neutro Abs 6.3  1.7 - 7.7 K/uL   Lymphocytes Relative 9 (*) 12 - 46 %   Lymphs Abs 0.7  0.7 - 4.0 K/uL   Monocytes Relative 4  3 - 12 %   Monocytes Absolute 0.3  0.1 - 1.0 K/uL   Eosinophils Relative 1  0 - 5 %   Eosinophils Absolute 0.1  0.0 - 0.7 K/uL   Basophils Relative 0  0 - 1 %   Basophils Absolute 0.0  0.0 - 0.1 K/uL  BLOOD GAS, ARTERIAL     Status: Abnormal   Collection Time    08/18/14  4:52 AM      Result Value Ref Range   FIO2 0.50     Delivery systems VENTILATOR     Mode PRESSURE REGULATED VOLUME CONTROL     VT 650     Rate 18     Peep/cpap 5.0     pH, Arterial 7.457 (*) 7.350 - 7.450   pCO2 arterial 26.8 (*) 35.0 - 45.0 mmHg   pO2, Arterial 106.0 (*) 80.0 - 100.0 mmHg   Bicarbonate 18.7 (*) 20.0 - 24.0 mEq/L   TCO2 19.5  0 - 100 mmol/L   Acid-base deficit 4.5 (*) 0.0 - 2.0 mmol/L   O2 Saturation 99.7     Patient temperature 98.6     Collection site ARTERIAL LINE     Drawn by [id number]     Sample type ARTERIAL DRAW     Allens test (pass/fail) PASS  PASS  GLUCOSE, CAPILLARY     Status: Abnormal   Collection Time    08/18/14  8:22 AM      Result Value Ref Range   Glucose-Capillary 102 (*) 70 - 99 mg/dL  PROTIME-INR     Status: None   Collection Time    08/18/14  9:30 AM      Result Value Ref Range   Prothrombin Time 14.9  11.6 - 15.2 seconds   INR 1.17  0.00 - 9.02  BASIC METABOLIC PANEL     Status: Abnormal   Collection Time    08/18/14 10:00 AM      Result Value Ref Range   Sodium 138  137 - 147 mEq/L   Potassium 5.6 (*) 3.7 - 5.3 mEq/L   Comment: SLIGHT HEMOLYSIS   Chloride 109  96 - 112 mEq/L   CO2 17 (*) 19 - 32 mEq/L   Glucose, Bld 105 (*)  70 - 99 mg/dL   BUN 10  6 - 23 mg/dL   Creatinine, Ser 0.71  0.50 - 1.35 mg/dL   Calcium 7.4 (*) 8.4 - 10.5 mg/dL   GFR calc non Af Amer >90  >90 mL/min   GFR calc Af  Amer >90  >90 mL/min   Comment: (NOTE)     The eGFR has been calculated using the CKD EPI equation.     This calculation has not been validated in all clinical situations.     eGFR's persistently <90 mL/min signify possible Chronic Kidney     Disease.   Anion gap 12  5 - 15  GLUCOSE, CAPILLARY     Status: None   Collection Time    08/18/14 11:59 AM      Result Value Ref Range   Glucose-Capillary 86  70 - 99 mg/dL  GLUCOSE, CAPILLARY     Status: None   Collection Time    08/18/14  3:49 PM      Result Value Ref Range   Glucose-Capillary 95  70 - 99 mg/dL  BASIC METABOLIC PANEL     Status: Abnormal   Collection Time    08/18/14  5:07 PM      Result Value Ref Range   Sodium 139  137 - 147 mEq/L   Potassium 4.3  3.7 - 5.3 mEq/L   Comment: DELTA CHECK NOTED   Chloride 107  96 - 112 mEq/L   CO2 20  19 - 32 mEq/L   Glucose, Bld 139 (*) 70 - 99 mg/dL   BUN 11  6 - 23 mg/dL   Creatinine, Ser 0.85  0.50 - 1.35 mg/dL   Calcium 7.6 (*) 8.4 - 10.5 mg/dL   GFR calc non Af Amer >90  >90 mL/min   GFR calc Af Amer >90  >90 mL/min   Comment: (NOTE)     The eGFR has been calculated using the CKD EPI equation.     This calculation has not been validated in all clinical situations.     eGFR's persistently <90 mL/min signify possible Chronic Kidney     Disease.   Anion gap 12  5 - 15  GLUCOSE, CAPILLARY     Status: Abnormal   Collection Time    08/18/14  8:22 PM      Result Value Ref Range   Glucose-Capillary 138 (*) 70 - 99 mg/dL  GLUCOSE, CAPILLARY     Status: Abnormal   Collection Time    08/18/14 11:48 PM      Result Value Ref Range   Glucose-Capillary 152 (*) 70 - 99 mg/dL  CBC     Status: Abnormal   Collection Time    08/19/14  4:41 AM      Result Value Ref Range   WBC 12.6 (*) 4.0 - 10.5 K/uL   RBC 5.00  4.22 - 5.81 MIL/uL   Hemoglobin 13.8  13.0 - 17.0 g/dL   HCT 40.8  39.0 - 52.0 %   MCV 81.6  78.0 - 100.0 fL   MCH 27.6  26.0 - 34.0 pg   MCHC 33.8  30.0 - 36.0 g/dL   RDW  18.2 (*) 11.5 - 15.5 %   Platelets 134 (*) 150 - 400 K/uL  COMPREHENSIVE METABOLIC PANEL     Status: Abnormal   Collection Time    08/19/14  4:41 AM      Result Value Ref Range   Sodium 141  137 - 147 mEq/L  Potassium 4.2  3.7 - 5.3 mEq/L   Chloride 108  96 - 112 mEq/L   CO2 22  19 - 32 mEq/L   Glucose, Bld 153 (*) 70 - 99 mg/dL   BUN 16  6 - 23 mg/dL   Creatinine, Ser 0.92  0.50 - 1.35 mg/dL   Calcium 7.5 (*) 8.4 - 10.5 mg/dL   Total Protein 6.4  6.0 - 8.3 g/dL   Albumin 2.7 (*) 3.5 - 5.2 g/dL   AST 120 (*) 0 - 37 U/L   ALT 110 (*) 0 - 53 U/L   Alkaline Phosphatase 64  39 - 117 U/L   Total Bilirubin 1.1  0.3 - 1.2 mg/dL   GFR calc non Af Amer 88 (*) >90 mL/min   GFR calc Af Amer >90  >90 mL/min   Comment: (NOTE)     The eGFR has been calculated using the CKD EPI equation.     This calculation has not been validated in all clinical situations.     eGFR's persistently <90 mL/min signify possible Chronic Kidney     Disease.   Anion gap 11  5 - 15  MAGNESIUM     Status: None   Collection Time    08/19/14  4:41 AM      Result Value Ref Range   Magnesium 1.5  1.5 - 2.5 mg/dL  PHOSPHORUS     Status: None   Collection Time    08/19/14  4:41 AM      Result Value Ref Range   Phosphorus 3.1  2.3 - 4.6 mg/dL  GLUCOSE, CAPILLARY     Status: Abnormal   Collection Time    08/19/14  5:23 AM      Result Value Ref Range   Glucose-Capillary 137 (*) 70 - 99 mg/dL  GLUCOSE, CAPILLARY     Status: Abnormal   Collection Time    08/19/14 11:17 AM      Result Value Ref Range   Glucose-Capillary 137 (*) 70 - 99 mg/dL  GLUCOSE, CAPILLARY     Status: Abnormal   Collection Time    08/19/14  5:53 PM      Result Value Ref Range   Glucose-Capillary 125 (*) 70 - 99 mg/dL    Mr Jeri Cos Wo Contrast  08/19/2014   CLINICAL DATA:  63 year old hypertensive male with history of alcohol abuse, alcohol withdrawal seizures, hepatitis and atrial flutter. Found down and received CPR. Unable to move upper  extremities post CPR. Old head injury.  EXAM: MRI HEAD WITHOUT AND WITH CONTRAST  TECHNIQUE: Multiplanar, multiecho pulse sequences of the brain and surrounding structures were obtained without and with intravenous contrast.  CONTRAST:  20 cc MultiHance.  COMPARISON:  MR cervical spine performed the same date and dictated separately.  Head CT 08/16/2014.  No comparison brain MR.  FINDINGS: Some the sequences are motion degraded.  No acute infarct  No intracranial hemorrhage.  Global atrophy. Ventricular size may be related to atrophy rather than hydrocephalus.  No intracranial mass or abnormal enhancement.  No MR evidence of Wernicke's encephalopathy.  Major intracranial vascular structures are patent.  Abnormal appearance of the upper cervical spine better delineated on recent cervical spine MR. Please see report from such. An additional consideration is that the patient had a traumatic event (such as a fall) which caused cord contusion contributing to patient's inability to move extremities. Subacute combined degeneration/ vitamin D deficiency contributing to the cervical cord changes although possible given the patient's  history of alcohol use is felt unlikely as cause of the current findings.  Prominent pannus.  This contributes to baseline spinal stenosis.  Left frontal scalp lipoma.  IMPRESSION: Some the sequences are motion degraded.  No acute infarct  No intracranial hemorrhage.  Global atrophy. Ventricular size may be related to atrophy rather than hydrocephalus.  No intracranial mass or abnormal enhancement.  No MR evidence of Wernicke's encephalopathy.  Abnormal appearance of the upper cervical spine better delineated on recent cervical spine MR. Please see report from such. An additional consideration is that the patient had a traumatic event (such as a fall) which caused cord contusion contributing to patient's inability to move extremities.   Electronically Signed   By: Chauncey Cruel M.D.   On:  08/19/2014 17:56   Mr Cervical Spine W Wo Contrast  08/19/2014   CLINICAL DATA:  Found down hypothermic, received CPR. Pneumonia. Not moving upper extremities. Ventilator. Sedated.  EXAM: MRI CERVICAL SPINE WITHOUT AND WITH CONTRAST  TECHNIQUE: Multiplanar and multiecho pulse sequences of the cervical spine, to include the craniocervical junction and cervicothoracic junction, were obtained according to standard protocol without and with intravenous contrast.  CONTRAST:  62m MULTIHANCE GADOBENATE DIMEGLUMINE 529 MG/ML IV SOLN  COMPARISON:  08/16/2014  FINDINGS: Despite efforts by the technologist and patient, motion artifact is present on today's exam and could not be eliminated. This reduces exam sensitivity and specificity.  Chronic degenerative findings and pannus posterior to the odontoid with likely chronic erosion of the odontoid, causing exaggerated curvature of the thecal sac at the craniocervical junction. Low-grade enhancement surrounds this pannus. Abnormal but low grade osseous edema noted anteriorly in C1 and C2.  The patient is orally intubated.  Abnormal increased T2 signal in the cord extends from the skull base through the C3-4 level, especially centrally, and most severely at the C1 vertebral level. No associated enhancement in the cord to suggest that this is due to high-grade tumor. Gradient images are severely limited by motion artifact.  There is reversal of the normal cervical lordosis with loss of intervertebral disc height at C6-7 and C7-T 1. Multilevel degenerative endplate findings especially at C7-T1. Diffuse low-grade third spacing of fluid in the neck.  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  Additional findings at individual levels are as follows:  C1-2: Pannus and exaggerated curvature at the cervicothoracic junction cause moderate central narrowing of the thecal sac. Spurring from C1 causes at least mild bilateral foraminal stenosis, and there is a posterior  fusion of the left C1-2 articulation.  C2-3: Prominent left and moderate right foraminal stenosis with moderate central narrowing of the thecal sac due to posterior osseous ridging, disc bulge, and facet arthropathy.  C3-4: Prominent left and moderate right foraminal stenosis with mild central narrowing of the thecal sac due to uncinate spurring facet arthropathy, and disc bulge.  C4-5: Moderate right and mild left foraminal stenosis due to uncinate and facet spurring.  C5-6: Moderate left foraminal stenosis due to facet arthropathy and uncinate spurring.  C6-7: Moderate bilateral foraminal stenosis due to uncinate and facet spurring along with mild disc bulge.  C7-T1: Prominent bilateral foraminal stenosis due to posterior osseous ridging, uncinate spurring, and facet arthropathy.  IMPRESSION: 1. Abnormal signal in the cervical cord extending from the craniocervical junction through the C3-4 level, possibly from cord infarct, cord edema, or chronic myelomalacia. No associated cord enhancement to suggest high-grade tumor. 2. Chronic degenerative findings at C1-2 and C2-3 with considerable pannus posterior to  the odontoid, causing exaggerated curvature of the cord and central narrowing of the thecal sac. 3. Cervical spondylosis and degenerative disc disease is also present. Overall associated impingement is graded as prominent at C2-3, C3-4, and C7-T1; and moderate at C1-2, C4-5, C5-6, and C6-7, as detailed above.   Electronically Signed   By: Sherryl Barters M.D.   On: 08/19/2014 16:55   Dg Chest Port 1 View  08/19/2014   CLINICAL DATA:  Intubated. Congestive heart failure. Alcohol abuse.  EXAM: PORTABLE CHEST - 1 VIEW  COMPARISON:  One day prior  FINDINGS: Endotracheal tube unchanged with tip 3.5 cm above carina. Nasogastric tube extends beyond the inferior aspect of the film. Right internal jugular line tip mid SVC.  Mild cardiomegaly. No pleural effusion or pneumothorax. Improved to resolved left base airspace  disease. Developing right base opacity.  IMPRESSION: Decreased to resolved left base airspace disease. New developing right base airspace disease. Favor shifting atelectasis. Early right-sided infection cannot be excluded.  Cardiomegaly without congestive failure.   Electronically Signed   By: Abigail Miyamoto M.D.   On: 08/19/2014 07:38   Dg Chest Port 1 View  08/18/2014   CLINICAL DATA:  Endotracheal to  EXAM: PORTABLE CHEST - 1 VIEW  COMPARISON:  Yesterday  FINDINGS: Tubular device is stable. No pneumothorax. Normal heart size. Increasing left basilar airspace disease.  IMPRESSION: Increasing left basilar airspace disease.   Electronically Signed   By: Maryclare Bean M.D.   On: 08/18/2014 07:34     Assessment/Plan: 63 year old man status post cardiac arrest on 08/16/2014 with acute quadriparesis associated central upper cervical cord acute lesion, likely ischemic in etiology, given the acuteness of quadriplegia and lack of enhancement in the cervical region that would indicate possible neoplasm. Patient is alert at this point. Any residual encephalopathy is likely mild at most.  Recommendations: 1. Physical, occupational and speech therapy consults when feasible. 2. Stroke risk assessment, including Ha1c, fasting lipid panel, carotid doppler, MRA of brain, and 2-echocardiogram. 3. ASA 300 mg supp daily 4. Consider inpatient rehabilitation consultation was extubated.  We will continue to follow this patient with you.  C.R. Nicole Kindred, MD [facility] [phone]  08/19/2014, 8:25 PM

## 2014-08-20 ENCOUNTER — Inpatient Hospital Stay (HOSPITAL_COMMUNITY): Payer: Medicare Other

## 2014-08-20 DIAGNOSIS — G8250 Quadriplegia, unspecified: Secondary | ICD-10-CM

## 2014-08-20 LAB — COMPREHENSIVE METABOLIC PANEL
ALT: 84 U/L — ABNORMAL HIGH (ref 0–53)
AST: 85 U/L — ABNORMAL HIGH (ref 0–37)
Albumin: 2.7 g/dL — ABNORMAL LOW (ref 3.5–5.2)
Alkaline Phosphatase: 62 U/L (ref 39–117)
Anion gap: 11 (ref 5–15)
BUN: 14 mg/dL (ref 6–23)
CALCIUM: 7.9 mg/dL — AB (ref 8.4–10.5)
CO2: 25 meq/L (ref 19–32)
CREATININE: 0.84 mg/dL (ref 0.50–1.35)
Chloride: 109 mEq/L (ref 96–112)
GLUCOSE: 120 mg/dL — AB (ref 70–99)
Potassium: 3.7 mEq/L (ref 3.7–5.3)
Sodium: 145 mEq/L (ref 137–147)
TOTAL PROTEIN: 6.4 g/dL (ref 6.0–8.3)
Total Bilirubin: 1.2 mg/dL (ref 0.3–1.2)

## 2014-08-20 LAB — GLUCOSE, CAPILLARY
GLUCOSE-CAPILLARY: 116 mg/dL — AB (ref 70–99)
GLUCOSE-CAPILLARY: 104 mg/dL — AB (ref 70–99)
GLUCOSE-CAPILLARY: 101 mg/dL — AB (ref 70–99)
Glucose-Capillary: 117 mg/dL — ABNORMAL HIGH (ref 70–99)

## 2014-08-20 LAB — CBC
HCT: 39.3 % (ref 39.0–52.0)
Hemoglobin: 12.8 g/dL — ABNORMAL LOW (ref 13.0–17.0)
MCH: 27.3 pg (ref 26.0–34.0)
MCHC: 32.6 g/dL (ref 30.0–36.0)
MCV: 83.8 fL (ref 78.0–100.0)
PLATELETS: 121 10*3/uL — AB (ref 150–400)
RBC: 4.69 MIL/uL (ref 4.22–5.81)
RDW: 18.3 % — ABNORMAL HIGH (ref 11.5–15.5)
WBC: 12.9 10*3/uL — AB (ref 4.0–10.5)

## 2014-08-20 LAB — MAGNESIUM: MAGNESIUM: 1.9 mg/dL (ref 1.5–2.5)

## 2014-08-20 LAB — VITAMIN B12: Vitamin B-12: 833 pg/mL (ref 211–911)

## 2014-08-20 MED ORDER — METOPROLOL TARTRATE 1 MG/ML IV SOLN
5.0000 mg | Freq: Four times a day (QID) | INTRAVENOUS | Status: DC
  Administered 2014-08-20 – 2014-08-23 (×12): 5 mg via INTRAVENOUS
  Filled 2014-08-20 (×16): qty 5

## 2014-08-20 MED ORDER — CHLORHEXIDINE GLUCONATE 0.12 % MT SOLN
15.0000 mL | Freq: Two times a day (BID) | OROMUCOSAL | Status: DC
  Administered 2014-08-20 – 2014-08-31 (×21): 15 mL via OROMUCOSAL
  Filled 2014-08-20 (×23): qty 15

## 2014-08-20 MED ORDER — CETYLPYRIDINIUM CHLORIDE 0.05 % MT LIQD
7.0000 mL | Freq: Two times a day (BID) | OROMUCOSAL | Status: DC
  Administered 2014-08-20 – 2014-08-31 (×22): 7 mL via OROMUCOSAL

## 2014-08-20 MED ORDER — METOPROLOL TARTRATE 1 MG/ML IV SOLN
2.5000 mg | INTRAVENOUS | Status: DC | PRN
  Filled 2014-08-20: qty 5

## 2014-08-20 MED ORDER — POTASSIUM CHLORIDE 10 MEQ/50ML IV SOLN
10.0000 meq | INTRAVENOUS | Status: AC
  Administered 2014-08-20 (×2): 10 meq via INTRAVENOUS
  Filled 2014-08-20 (×2): qty 50

## 2014-08-20 MED ORDER — ASPIRIN 300 MG RE SUPP
300.0000 mg | Freq: Every day | RECTAL | Status: DC
  Administered 2014-08-20 – 2014-08-22 (×3): 300 mg via RECTAL
  Filled 2014-08-20 (×4): qty 1

## 2014-08-20 MED ORDER — MAGNESIUM SULFATE 40 MG/ML IJ SOLN
2.0000 g | Freq: Once | INTRAMUSCULAR | Status: AC
  Administered 2014-08-20: 2 g via INTRAVENOUS
  Filled 2014-08-20: qty 50

## 2014-08-20 MED ORDER — DEXAMETHASONE SODIUM PHOSPHATE 4 MG/ML IJ SOLN
4.0000 mg | Freq: Four times a day (QID) | INTRAMUSCULAR | Status: DC
  Filled 2014-08-20 (×5): qty 1

## 2014-08-20 NOTE — Progress Notes (Signed)
SLP Cancellation Note  Patient Details Name: Anthony Mcclure MRN: [id number] DOB: Jan 21, 1951   Cancelled evaluation:    Order received for clinical swallow eval; pt extubated at 57 today; no vocalizations nor cough s/p extubation.  Will defer swallow eval until next date.       Juan Quam Laurice 08/20/2014, 2:12 PM

## 2014-08-20 NOTE — Progress Notes (Signed)
OG tube verified with KUB. Pt continues to spit up tube feedings with zero gastric residuals. Abdomen distended and soft. Dr. Jimmy Footman ordered to hold tube feeds.

## 2014-08-20 NOTE — Progress Notes (Signed)
PULMONARY / CRITICAL CARE MEDICINE  Name: Anthony Mcclure MRN: [id number] DOB: 11/15/1951    ADMISSION DATE:  08/16/2014 CONSULTATION DATE:  08/20/2014  REFERRING MD :  EDP  CHIEF COMPLAINT:  PEA Arrest  INITIAL PRESENTATION: 63 yo admitted 9/3 after cardiac arrest with total down time about 15 minutes before ROSC.  STUDIES / EVENTS:  9/3  Admitted with cardiac arrest, hypothermia started  9/3  CT Head / C-Spine >>> nad 9/4  TTE >>> EF 55% 9/5  Rewarming finished 9/5  EEG >>> nonspecific cerebral dsyfxn, no seizures  9/6  New finding of quadriparesis, MRI brain / C-spine ordered 9/7 MRI brain/C Spine: abn signal to cervical cord C3-4 ? Possible cord infarct or edema . Brain neg for acute process.  9/7 Passed SBT. Strong cough when suctioned. Extubated.   INTERVAL HISTORY:  Improving mentation, post rewarming w/ notable quadriparesis  MRI showed prob cord infarct [facility]  Appreciate Neurology consult -concern for cord injury w/ infarct    Per neuro note, NS did not feel acute surgery or steroids were indicated.  Pt is weaning this am, strong cough , good vol w/ PS 5/5 .    VITAL SIGNS: Temp:  [98 F (36.7 C)-102.2 F (39 C)] 100.9 F (38.3 C) (09/07 1200) Pulse Rate:  [85-113] 108 (09/07 1154) Resp:  [12-25] 17 (09/07 1200) BP: (129-176)/(56-111) 153/101 mmHg (09/07 1200) SpO2:  [92 %-100 %] 100 % (09/07 1154) Arterial Line BP: (115-182)/(59-99) 134/67 mmHg (09/07 1200) FiO2 (%):  [40 %] 40 % (09/07 1154) Weight:  [96.6 kg (212 lb 15.4 oz)] 96.6 kg (212 lb 15.4 oz) (09/07 0500)  HEMODYNAMICS:    VENTILATOR SETTINGS: Vent Mode:  [-] PSV;CPAP FiO2 (%):  [40 %] 40 % Set Rate:  [18 bmp] 18 bmp Vt Set:  [550 mL] 550 mL PEEP:  [5 cmH20] 5 cmH20 Pressure Support:  [5 cmH20] 5 cmH20 Plateau Pressure:  [16 cmH20-17 cmH20] 16 cmH20  INTAKE / OUTPUT: Intake/Output     09/06 0701 - 09/07 0700 09/07 0701 - 09/08 0700   I.V. (mL/kg) 198 (2) 47.7 (0.5)   NG/GT 1369.8 0   IV  Piggyback 450 100   Total Intake(mL/kg) 2017.8 (20.9) 147.7 (1.5)   Urine (mL/kg/hr) 1525 (0.7) 295 (0.5)   Total Output 1525 295   Net +492.8 -147.3         PHYSICAL EXAMINATION: General: No distress Neuro: Alert, quadriparetic - moves BLEs weakly, minimal intermittent W/D of RUE.  HEENT: Poor oral hygiene Cardiovascular: RRR, no murmurs Lungs: Bilateral air entry, few rhonchi Abdomen: Soft abdomen, hypoactive sounds Musculoskeletal: No gross deformities, no edema, unable to move UE , w/d reflex of right hand. Slight movement of LE ?reflexive movement  Skin: Intact, cool, no rashes.  LABS:  CBC  Recent Labs Lab 08/18/14 0419 08/19/14 0441 08/20/14 0541  WBC 7.4 12.6* 12.9*  HGB 14.3 13.8 12.8*  HCT 40.9 40.8 39.3  PLT 144* 134* 121*    Coag's  Recent Labs Lab 08/16/14 2250 08/17/14 0435 08/18/14 0930  APTT 32 37  --   INR 1.24 1.31 1.17   BMET  Recent Labs Lab 08/18/14 1707 08/19/14 0441 08/20/14 0541  NA 139 141 145  K 4.3 4.2 3.7  CL 107 108 109  CO2 20 22 25   BUN 11 16 14   CREATININE 0.85 0.92 0.84  GLUCOSE 139* 153* 120*   Electrolytes  Recent Labs Lab 08/16/14 2250  08/18/14 1707 08/19/14 0441 08/20/14 0541  CALCIUM 7.6*  < >  7.6* 7.5* 7.9*  MG 1.5  --   --  1.5 1.9  PHOS 3.3  --   --  3.1  --   < > = values in this interval not displayed. Sepsis Markers  Recent Labs Lab 08/16/14 2250 08/17/14 0335 08/17/14 1000  LATICACIDVEN 4.3* 2.1 1.7   ABG  Recent Labs Lab 08/16/14 2048 08/17/14 0059 08/18/14 0452  PHART 7.257* 7.369 7.457*  PCO2ART 35.0 24.2* 26.8*  PO2ART 98.0 156.0* 106.0*   Liver Enzymes  Recent Labs Lab 08/18/14 0419 08/19/14 0441 08/20/14 0541  AST 187* 120* 85*  ALT 135* 110* 84*  ALKPHOS 56 64 62  BILITOT 1.7* 1.1 1.2  ALBUMIN 2.7* 2.7* 2.7*   Cardiac Enzymes  Recent Labs Lab 08/16/14 2250 08/17/14 0330 08/17/14 0915  TROPONINI <0.30 <0.30 <0.30   Glucose  Recent Labs Lab  08/19/14 0523 08/19/14 1117 08/19/14 1753 08/19/14 2119 08/20/14 0529 08/20/14 1141  GLUCAP 137* 137* 125* 131* 116* 117*   IMAGING:  Mr Kizzie Fantasia Contrast  08/19/2014   CLINICAL DATA:  63 year old hypertensive male with history of alcohol abuse, alcohol withdrawal seizures, hepatitis and atrial flutter. Found down and received CPR. Unable to move upper extremities post CPR. Old head injury.  EXAM: MRI HEAD WITHOUT AND WITH CONTRAST  TECHNIQUE: Multiplanar, multiecho pulse sequences of the brain and surrounding structures were obtained without and with intravenous contrast.  CONTRAST:  20 cc MultiHance.  COMPARISON:  MR cervical spine performed the same date and dictated separately.  Head CT 08/16/2014.  No comparison brain MR.  FINDINGS: Some the sequences are motion degraded.  No acute infarct  No intracranial hemorrhage.  Global atrophy. Ventricular size may be related to atrophy rather than hydrocephalus.  No intracranial mass or abnormal enhancement.  No MR evidence of Wernicke's encephalopathy.  Major intracranial vascular structures are patent.  Abnormal appearance of the upper cervical spine better delineated on recent cervical spine MR. Please see report from such. An additional consideration is that the patient had a traumatic event (such as a fall) which caused cord contusion contributing to patient's inability to move extremities. Subacute combined degeneration/ vitamin D deficiency contributing to the cervical cord changes although possible given the patient's history of alcohol use is felt unlikely as cause of the current findings.  Prominent pannus.  This contributes to baseline spinal stenosis.  Left frontal scalp lipoma.  IMPRESSION: Some the sequences are motion degraded.  No acute infarct  No intracranial hemorrhage.  Global atrophy. Ventricular size may be related to atrophy rather than hydrocephalus.  No intracranial mass or abnormal enhancement.  No MR evidence of Wernicke's  encephalopathy.  Abnormal appearance of the upper cervical spine better delineated on recent cervical spine MR. Please see report from such. An additional consideration is that the patient had a traumatic event (such as a fall) which caused cord contusion contributing to patient's inability to move extremities.   Electronically Signed   By: Chauncey Cruel M.D.   On: 08/19/2014 17:56   Mr Cervical Spine W Wo Contrast  08/19/2014   CLINICAL DATA:  Found down hypothermic, received CPR. Pneumonia. Not moving upper extremities. Ventilator. Sedated.  EXAM: MRI CERVICAL SPINE WITHOUT AND WITH CONTRAST  TECHNIQUE: Multiplanar and multiecho pulse sequences of the cervical spine, to include the craniocervical junction and cervicothoracic junction, were obtained according to standard protocol without and with intravenous contrast.  CONTRAST:  29mL MULTIHANCE GADOBENATE DIMEGLUMINE 529 MG/ML IV SOLN  COMPARISON:  08/16/2014  FINDINGS: Despite  efforts by the technologist and patient, motion artifact is present on today's exam and could not be eliminated. This reduces exam sensitivity and specificity.  Chronic degenerative findings and pannus posterior to the odontoid with likely chronic erosion of the odontoid, causing exaggerated curvature of the thecal sac at the craniocervical junction. Low-grade enhancement surrounds this pannus. Abnormal but low grade osseous edema noted anteriorly in C1 and C2.  The patient is orally intubated.  Abnormal increased T2 signal in the cord extends from the skull base through the C3-4 level, especially centrally, and most severely at the C1 vertebral level. No associated enhancement in the cord to suggest that this is due to high-grade tumor. Gradient images are severely limited by motion artifact.  There is reversal of the normal cervical lordosis with loss of intervertebral disc height at C6-7 and C7-T 1. Multilevel degenerative endplate findings especially at C7-T1. Diffuse low-grade third  spacing of fluid in the neck.  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  Additional findings at individual levels are as follows:  C1-2: Pannus and exaggerated curvature at the cervicothoracic junction cause moderate central narrowing of the thecal sac. Spurring from C1 causes at least mild bilateral foraminal stenosis, and there is a posterior fusion of the left C1-2 articulation.  C2-3: Prominent left and moderate right foraminal stenosis with moderate central narrowing of the thecal sac due to posterior osseous ridging, disc bulge, and facet arthropathy.  C3-4: Prominent left and moderate right foraminal stenosis with mild central narrowing of the thecal sac due to uncinate spurring facet arthropathy, and disc bulge.  C4-5: Moderate right and mild left foraminal stenosis due to uncinate and facet spurring.  C5-6: Moderate left foraminal stenosis due to facet arthropathy and uncinate spurring.  C6-7: Moderate bilateral foraminal stenosis due to uncinate and facet spurring along with mild disc bulge.  C7-T1: Prominent bilateral foraminal stenosis due to posterior osseous ridging, uncinate spurring, and facet arthropathy.  IMPRESSION: 1. Abnormal signal in the cervical cord extending from the craniocervical junction through the C3-4 level, possibly from cord infarct, cord edema, or chronic myelomalacia. No associated cord enhancement to suggest high-grade tumor. 2. Chronic degenerative findings at C1-2 and C2-3 with considerable pannus posterior to the odontoid, causing exaggerated curvature of the cord and central narrowing of the thecal sac. 3. Cervical spondylosis and degenerative disc disease is also present. Overall associated impingement is graded as prominent at C2-3, C3-4, and C7-T1; and moderate at C1-2, C4-5, C5-6, and C6-7, as detailed above.   Electronically Signed   By: Sherryl Barters M.D.   On: 08/19/2014 16:55   Dg Chest Port 1 View  08/20/2014   CLINICAL DATA:  Acute  respiratory failure. On ventilator. Status post cardiac arrest.  EXAM: PORTABLE CHEST - 1 VIEW  COMPARISON:  08/19/2014  FINDINGS: Mild cardiomegaly is stable. Increased opacity is seen in the right lower lobe silhouetting the right hemidiaphragm. No evidence of pleural effusion or pneumothorax. Support lines and tubes in appropriate position.  IMPRESSION: Increased right lower lobe infiltrate.  Stable mild cardiomegaly.   Electronically Signed   By: Earle Gell M.D.   On: 08/20/2014 09:15   Dg Chest Port 1 View  08/19/2014   CLINICAL DATA:  Intubated. Congestive heart failure. Alcohol abuse.  EXAM: PORTABLE CHEST - 1 VIEW  COMPARISON:  One day prior  FINDINGS: Endotracheal tube unchanged with tip 3.5 cm above carina. Nasogastric tube extends beyond the inferior aspect of the film. Right internal jugular line tip  mid SVC.  Mild cardiomegaly. No pleural effusion or pneumothorax. Improved to resolved left base airspace disease. Developing right base opacity.  IMPRESSION: Decreased to resolved left base airspace disease. New developing right base airspace disease. Favor shifting atelectasis. Early right-sided infection cannot be excluded.  Cardiomegaly without congestive failure.   Electronically Signed   By: Abigail Miyamoto M.D.   On: 08/19/2014 07:38   Dg Abd Portable 1v  08/20/2014   CLINICAL DATA:  Feeding tube placement  EXAM: PORTABLE ABDOMEN - 1 VIEW  COMPARISON:  CT abdomen pelvis dated 06/14/2009  FINDINGS: Enteric tube terminates in the distal gastric antrum.  Nonspecific bowel gas pattern, but without disproportionate small bowel dilatation to suggest small bowel obstruction.  Degenerative changes of the lumbar spine.  IMPRESSION: Enteric tube terminates in the distal gastric antrum.   Electronically Signed   By: Julian Hy M.D.   On: 08/20/2014 00:36   ASSESSMENT / PLAN:  PULMONARY A: Acute respiratory failure in the setting of PEA cardiac arrest, intubated 9/3 >> 9/07 P:   Extubate pt .   NTS if needed.  Titrate O2 for sat >90%  High risk of re-intubation due to poor cough reflex Anticipate difficult airway due to C collar If re-intubated, would proceed with trach  CARDIOVASCULAR A:  PEA arrest, downtime 30 minutes before ROSC Hx PAF, HTN, chronic diastolic heart failure  Tachycardia  P:  Goal MAP > 65 Restart Lopressor  Hydralazine PRN  RENAL A:   AKI Hypomagnesemia  Hypokalemia  P:   Trend BMP Mg 2 Free water 100 q8h  GASTROINTESTINAL A:   Nutrition GI Px P:   NPO TF  Protonix Will need to assess swallow ability post extubation , decide on need for NG/PEG if unable to swallow   HEMATOLOGIC A:   VTE Prophylaxis P:  Trend CBC Heparin  INFECTIOUS A:   Possible aspiration pneumonia 9/6 BC x 2 >> 9/6 Sputum >> P:   Unasyn 9/6 >>  ENDOCRINE A:   Hypoglycemia-improving  P:   CBG  NEUROLOGIC A:   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 I have formulated the assessment and plan as reflected in the note above with amendments made by me. 40 mins of direct critical care time provided. I have looked at him again post extubation. WOB not excessive. Not F/C. Will not cough on command. NTS PRN  Merton Border, MD;  PCCM service; Mobile [phone]  08/20/2014, 12:59 PM

## 2014-08-20 NOTE — Care Management Note (Addendum)
    Page 1 of 1   08/24/2014     4:19:38 PM CARE MANAGEMENT NOTE 08/24/2014  Patient:  Anthony Mcclure, Anthony Mcclure   Account Number:  [id number]  Date Initiated:  08/20/2014  Documentation initiated by:  Elissa Hefty  Subjective/Objective Assessment:   adm w cardiac arrest-vent     Action/Plan:   Anticipated DC Date:     Anticipated DC Plan:           Choice offered to / List presented to:             Status of service:   Medicare Important Message given?  YES (If response is "NO", the following Medicare IM given date fields will be blank) Date Medicare IM given:  08/20/2014 Medicare IM given by:  Elissa Hefty Date Additional Medicare IM given:  08/23/2014 Additional Medicare IM given by:  Elissa Hefty  Discharge Disposition:    Per UR Regulation:    If discussed at Long Length of Stay Meetings, dates discussed:   08/21/2014  08/23/2014  08/23/2014    Comments:  08-24-14 Potter, RN,BSN [phone] Medicare IM given to pt.

## 2014-08-20 NOTE — Progress Notes (Signed)
?   If pt spitting up tube feeds, orally suctioned thin tan secretions in and around mouth. Pt had zero residuals and OG placement verified with ausculation, tube feeding held. Dr. Halford Chessman called and KUB ordered.

## 2014-08-20 NOTE — Progress Notes (Addendum)
Subjective: No acute events  Exam: Filed Vitals:   08/20/14 0750  BP: 149/69  Pulse: 101  Temp:   Resp: 14   Gen: In bed, NAD MS: Able to shake head and nod, though responses slightly unreliable.  Motor: has intermittent sponaneous triple flexion of the bilateral legs.  Sensory: does not endorse sensation to touch or vibration in his legs.  BJY:NWGNF, triple flexion to babinski bilaterally  Impression: 63 yo M with cord injury and edema. Cord infarct is certainly possible if the stenosis caused compromise of a vessel. Abnormal wall movement during arrest could also lead to thrombus formation with embolic event, though I feel that this is less likely.   I discussed his case with Dr. Vertell Limber of neurosurgery who reviewed the images and stated that he did not see any active compression and therefore did not feel that there was a role for acute surgery or steroids.   Recommendations: 1) ASA suppository 2) echo showed no clear thrombus.  3) lipid panel.  4) continue supportive care, he is breathing independantly and may be extubatable. Defer to CCM on this standpoint.    Roland Rack, MD [facility] [phone]  If 7pm- 7am, please page neurology on call as listed in [place].

## 2014-08-20 NOTE — Procedures (Signed)
Extubation Procedure Note  Patient Details:   Name: Anthony Mcclure DOB: [birth year] MRN: [id number]   Airway Documentation:     Evaluation  O2 sats: stable throughout Complications: No apparent complications Patient did tolerate procedure well. Bilateral Breath Sounds: Clear;Diminished Suctioning: Airway No Patient tolerated wean well. MD ordered to extubate. Positive for cuff leak. Patient extubated to 3 Lpm nasal cannula. Patient not able to cough or vocalize post extubation. RN and MD aware. No signs of dyspnea or stridor noted. Patient resting comfortably. Will continue to monitor.  Myrtie Neither 08/20/2014, 1:49 PM

## 2014-08-21 ENCOUNTER — Inpatient Hospital Stay (HOSPITAL_COMMUNITY): Payer: Medicare Other

## 2014-08-21 LAB — CBC
HCT: 37.3 % — ABNORMAL LOW (ref 39.0–52.0)
Hemoglobin: 12.4 g/dL — ABNORMAL LOW (ref 13.0–17.0)
MCH: 27.6 pg (ref 26.0–34.0)
MCHC: 33.2 g/dL (ref 30.0–36.0)
MCV: 82.9 fL (ref 78.0–100.0)
Platelets: 128 10*3/uL — ABNORMAL LOW (ref 150–400)
RBC: 4.5 MIL/uL (ref 4.22–5.81)
RDW: 18.1 % — ABNORMAL HIGH (ref 11.5–15.5)
WBC: 11.7 10*3/uL — AB (ref 4.0–10.5)

## 2014-08-21 LAB — GLUCOSE, CAPILLARY
GLUCOSE-CAPILLARY: 105 mg/dL — AB (ref 70–99)
GLUCOSE-CAPILLARY: 125 mg/dL — AB (ref 70–99)
Glucose-Capillary: 87 mg/dL (ref 70–99)
Glucose-Capillary: 112 mg/dL — ABNORMAL HIGH (ref 70–99)
Glucose-Capillary: 102 mg/dL — ABNORMAL HIGH (ref 70–99)

## 2014-08-21 LAB — BASIC METABOLIC PANEL
ANION GAP: 11 (ref 5–15)
BUN: 14 mg/dL (ref 6–23)
CO2: 25 meq/L (ref 19–32)
Calcium: 8.1 mg/dL — ABNORMAL LOW (ref 8.4–10.5)
Chloride: 107 mEq/L (ref 96–112)
Creatinine, Ser: 0.77 mg/dL (ref 0.50–1.35)
GFR calc Af Amer: >90 mL/min (ref >90)
GFR calc non Af Amer: >90 mL/min (ref >90)
Glucose, Bld: 93 mg/dL (ref 70–99)
POTASSIUM: 4.9 meq/L (ref 3.7–5.3)
SODIUM: 143 meq/L (ref 137–147)

## 2014-08-21 LAB — LIPID PANEL
Cholesterol: 180 mg/dL (ref 0–200)
HDL: 63 mg/dL (ref >39)
LDL Cholesterol: 98 mg/dL (ref 0–99)
TRIGLYCERIDES: 93 mg/dL (ref <150)
Total CHOL/HDL Ratio: 2.9 RATIO
VLDL: 19 mg/dL (ref 0–40)

## 2014-08-21 LAB — MAGNESIUM: MAGNESIUM: 2.1 mg/dL (ref 1.5–2.5)

## 2014-08-21 MED ORDER — VITAL HIGH PROTEIN PO LIQD
1000.0000 mL | ORAL | Status: DC
  Filled 2014-08-21 (×2): qty 1000

## 2014-08-21 MED ORDER — FUROSEMIDE 10 MG/ML IJ SOLN
20.0000 mg | Freq: Two times a day (BID) | INTRAMUSCULAR | Status: AC
  Administered 2014-08-21 – 2014-08-22 (×4): 20 mg via INTRAVENOUS
  Filled 2014-08-21 (×4): qty 2

## 2014-08-21 MED ORDER — VANCOMYCIN HCL IN DEXTROSE 1-5 GM/200ML-% IV SOLN
1000.0000 mg | Freq: Three times a day (TID) | INTRAVENOUS | Status: DC
  Administered 2014-08-22 – 2014-08-23 (×4): 1000 mg via INTRAVENOUS
  Filled 2014-08-21 (×6): qty 200

## 2014-08-21 MED ORDER — VITAL AF 1.2 CAL PO LIQD
1000.0000 mL | ORAL | Status: DC
  Administered 2014-08-21 – 2014-08-27 (×5): 1000 mL
  Filled 2014-08-21 (×7): qty 1000
  Filled 2014-08-21: qty 237
  Filled 2014-08-21 (×7): qty 1000

## 2014-08-21 MED ORDER — VANCOMYCIN HCL 10 G IV SOLR
1750.0000 mg | Freq: Once | INTRAVENOUS | Status: AC
  Administered 2014-08-21: 1750 mg via INTRAVENOUS
  Filled 2014-08-21: qty 1750

## 2014-08-21 NOTE — Progress Notes (Signed)
Rehab Admissions Coordinator Note:  Patient was screened by Retta Diones for appropriateness for an Inpatient Acute Rehab Consult.  At this time, we are recommending Inpatient Rehab consult.  Retta Diones 08/21/2014, 9:34 AM  I can be reached at [phone].

## 2014-08-21 NOTE — Progress Notes (Signed)
Foley catheter removed at 1742 per protocol.  Condom catheter applied.

## 2014-08-21 NOTE — Progress Notes (Signed)
ANTIBIOTIC CONSULT NOTE - INITIAL  Pharmacy Consult for vancomycin Indication: pneumonia  No Known Allergies  Patient Measurements: Height: 6' (182.9 cm) Weight: 212 lb 1.3 oz (96.2 kg) IBW/kg (Calculated) : 77.6 Adjusted Body Weight: 85 kg  Vital Signs: Temp: 100.6 F (38.1 C) (09/08 1300) Temp src: Core (Comment) (09/08 1200) BP: 179/90 mmHg (09/08 1300) Pulse Rate: 64 (09/08 1300) Intake/Output from previous day: 09/07 0701 - 09/08 0700 In: 743 [I.V.:193; IV Piggyback:550] Out: 1280 [Urine:1280] Intake/Output from this shift: Total I/O In: 100 [IV Piggyback:100] Out: 2050 [Urine:2050]  Labs:  Recent Labs  08/19/14 0441 08/20/14 0541 08/21/14 0300  WBC 12.6* 12.9* 11.7*  HGB 13.8 12.8* 12.4*  PLT 134* 121* 128*  CREATININE 0.92 0.84 0.77   Estimated Creatinine Clearance: 113.6 ml/min (by C-G formula based on Cr of 0.77). No results found for this basename: Letta Median, VANCORANDOM, GENTTROUGH, GENTPEAK, GENTRANDOM, TOBRATROUGH, TOBRAPEAK, TOBRARND, AMIKACINPEAK, AMIKACINTROU, AMIKACIN,  in the last 72 hours   Microbiology: Recent Results (from the past 720 hour(s))  URINE CULTURE     Status: None   Collection Time    08/16/14  9:11 PM      Result Value Ref Range Status   Specimen Description URINE, CLEAN CATCH   Final   Special Requests none Normal   Final   Culture  Setup Time     Final   Value: 08/16/2014 21:58     Performed at [place]     Final   Value: NO GROWTH     Performed at [facility]   Culture     Final   Value: NO GROWTH     Performed at [facility]   Report Status 08/18/2014 FINAL   Final  MRSA PCR SCREENING     Status: None   Collection Time    08/16/14 10:43 PM      Result Value Ref Range Status   MRSA by PCR NEGATIVE  NEGATIVE Final   Comment:            The GeneXpert MRSA Assay (FDA     approved for NASAL specimens     only), is one component of a     comprehensive MRSA  colonization     surveillance program. It is not     intended to diagnose MRSA     infection nor to guide or     monitor treatment for     MRSA infections.  CULTURE, RESPIRATORY (NON-EXPECTORATED)     Status: None   Collection Time    08/19/14 11:15 AM      Result Value Ref Range Status   Specimen Description TRACHEAL ASPIRATE   Final   Special Requests NONE   Final   Gram Stain     Final   Value: ABUNDANT WBC PRESENT,BOTH PMN AND MONONUCLEAR     NO SQUAMOUS EPITHELIAL CELLS SEEN     MODERATE GRAM POSITIVE COCCI     IN CLUSTERS     Performed at [facility]   Culture     Final   Value: ABUNDANT STAPHYLOCOCCUS AUREUS     Note: RIFAMPIN AND GENTAMICIN SHOULD NOT BE USED AS SINGLE DRUGS FOR TREATMENT OF STAPH INFECTIONS.     Performed at [facility]   Report Status PENDING   Incomplete  CULTURE, BLOOD (ROUTINE X 2)     Status: None   Collection Time    08/19/14 11:25 AM  Result Value Ref Range Status   Specimen Description BLOOD LEFT ARM   Final   Special Requests BOTTLES DRAWN AEROBIC AND ANAEROBIC 5 CC   Final   Culture  Setup Time     Final   Value: 08/19/2014 19:17     Performed at [facility]   Culture     Final   Value:        BLOOD CULTURE RECEIVED NO GROWTH TO DATE CULTURE WILL BE HELD FOR 5 DAYS BEFORE ISSUING A FINAL NEGATIVE REPORT     Performed at [facility]   Report Status PENDING   Incomplete  CULTURE, BLOOD (ROUTINE X 2)     Status: None   Collection Time    08/19/14 11:30 AM      Result Value Ref Range Status   Specimen Description BLOOD LEFT ARM   Final   Special Requests BOTTLES DRAWN AEROBIC AND ANAEROBIC 5 CC    Final   Culture  Setup Time     Final   Value: 08/19/2014 19:17     Performed at [facility]   Culture     Final   Value:        BLOOD CULTURE RECEIVED NO GROWTH TO DATE CULTURE WILL BE HELD FOR 5 DAYS BEFORE ISSUING A FINAL NEGATIVE REPORT     Performed at [facility]   Report Status  PENDING   Incomplete    Medical History: Past Medical History  Diagnosis Date  . CHF (congestive heart failure)   . Alcohol abuse   . Seizure   . Noncompliance with medication treatment due to intermittent use of medication 02/02/2012  . HTN (hypertension) 02/02/2012  . Alcohol withdrawal seizure 02/02/2012  . Hypertension   . Dysrhythmia     paroxismal atrial fib  . Paroxysmal Atrial Flutter 02/01/2012  . Hepatitis C 02/02/2012    Unsure if he was ever diagnosed with Hep C  . Hepatitis C     Medications:  Scheduled:  . ampicillin-sulbactam (UNASYN) IV  3 g Intravenous Q6H  . antiseptic oral rinse  7 mL Mouth Rinse q12n4p  . aspirin  300 mg Rectal Daily  . chlorhexidine  15 mL Mouth Rinse BID  . folic acid  1 mg Intravenous Daily  . furosemide  20 mg Intravenous Q12H  . heparin  5,000 Units Subcutaneous 3 times per day  . metoprolol  5 mg Intravenous 4 times per day  . pantoprazole (PROTONIX) IV  40 mg Intravenous Daily  . thiamine  100 mg Intravenous Daily   Infusions:  . sodium chloride Stopped (08/21/14 0700)  . feeding supplement (VITAL AF 1.2 CAL) 1,000 mL (08/21/14 1400)   Assessment: 63 yo m admitted on 9/3 s/p PEA arrest outside of hospital with no CPR given for around 10-15 minutes. Patient is now s/p hypothermia protocol.  Unasyn was started on 9/6 for concerns of aspiration pneumonia.  Respiratory culture today reveals abundant staphylococcus aureus.  Pharmacy is consulted to dose vancomycin for pneumonia. Wbc 11.7, tmax in last 24hrs 101.3, SCr 0.77, CrCl >100 ml/min, weight 96.2 kg.  Will give a load of 1,750 mg then begin 1,000 mg IV q8hr.  Consider VT at Css d/t patient's weight.  9/3 UCx: NG final 9/6 Bld x2: NGTD 9/6 trach aspirate: abundant staph aureus  Unasyn 9/6 >>  Vancomycin 9/8 >>  Goal of Therapy:  Vancomycin trough level 15-20 mcg/ml  Plan:  Give vancomycin 1,750 mg load Begin vancomycin  1,000 mg IV q8hr Consider VT at Css d/t patient's  weight Monitor wbc, temp curve, c&s, clinical course  Ariyel Jeangilles L. Nicole Kindred, PharmD Clinical Pharmacy Resident Pager: [phone] 08/21/2014 3:49 PM

## 2014-08-21 NOTE — Evaluation (Signed)
Physical Therapy Evaluation Patient Details Name: Anthony Mcclure MRN: [id number] DOB: [birth year] Today's Date: 08/21/2014   History of Present Illness  63 yo adm 08/16/14 s/p cardiac arrest with 15-30 minute prior to return of spontaneous circulation (ROSC). Pt intubated and underwent hypothermia protocol. 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 MRI c-spine showed abnormal signal in the cord from the craniocervical junction through C3-4 level. 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 Pt currently poorly communicative due to inability to vocalize and unable to fully assess cognition or potential caregiver support.  Pt currently with functional limitations due to the deficits listed below (see PT Problem List).  Pt will benefit from skilled PT to increase their independence and safety with mobility to allow discharge to the venue listed below.       Follow Up Recommendations CIR;Supervision/Assistance - 24 hour    Equipment Recommendations   (TBD if no CIR)    Recommendations for Other Services Rehab consult     Precautions / Restrictions Precautions Precautions: Cervical;Fall Required Braces or Orthoses: Cervical Brace Cervical Brace: Hard collar;At all times Restrictions Weight Bearing Restrictions: No      Mobility  Bed Mobility Overal bed mobility: Needs Assistance Bed Mobility: Rolling Rolling: Total assist         General bed mobility comments: Pt in chair-like position on arrival and at departure; HOB 50 with BP 168/82  Transfers                    Ambulation/Gait                Stairs            Wheelchair Mobility    Modified Rankin (Stroke Patients Only)       Balance Overall balance assessment:  (TBA, however anticipate total assist)                                           Pertinent Vitals/Pain Pain Assessment: Faces Faces Pain Scale: No hurt    Home Living Family/patient expects to be discharged to:: Unsure                 Additional Comments: Pt attempting to lip-speak, no vocalizations; unreliable with yes/no questions related to his birthday    Prior Function Level of Independence: Independent         Comments: believed to be independent     Hand Dominance        Extremity/Trunk Assessment   Upper Extremity Assessment: Defer to OT evaluation;RUE deficits/detail;LUE deficits/detail RUE Deficits / Details: flaccid, no withdrawal to pain     LUE Deficits / Details: flaccid, no withdrawal to pain   Lower Extremity Assessment: RLE deficits/detail;LLE deficits/detail RLE Deficits / Details: PROM WFL; spontaneous flexor spasms noted (hip, knee flexion with dorsiflexion); pt unable to elicit any movements on command; + brisk withdrawal to pain (nailbed pressure) LLE Deficits / Details: PROM WFL; spontaneous flexor spasms noted (hip, knee flexion with dorsiflexion); pt unable to elicit any movements on command; + brisk withdrawal to pain (nailbed pressure)     Communication   Communication: Expressive difficulties (no vocalization)  Cognition Arousal/Alertness: Awake/alert Behavior During Therapy: WFL for tasks assessed/performed Overall Cognitive Status: Difficult to assess  General Comments General comments (skin integrity, edema, etc.): Pt smiles when his name is called     Exercises Other Exercises Other Exercises: PROM all extremities, planes of motion      Assessment/Plan    PT Assessment Patient needs continued PT services  PT Diagnosis Quadraplegia;Altered mental status   PT Problem List Decreased strength;Decreased balance;Decreased mobility;Decreased cognition;Decreased knowledge of use of DME;Impaired sensation  PT Treatment Interventions DME  instruction;Functional mobility training;Therapeutic activities;Therapeutic exercise;Balance training;Neuromuscular re-education;Cognitive remediation;Patient/family education;Other (comment) (pressure-relief education)   PT Goals (Current goals can be found in the Care Plan section) Acute Rehab PT Goals Patient Stated Goal: uanble to state/communicate PT Goal Formulation: Patient unable to participate in goal setting Time For Goal Achievement: 09/04/14 Potential to Achieve Goals: Good    Frequency Min 3X/week   Barriers to discharge Decreased caregiver support unknown family support; RN reports a cousin has been named his next of kin    Co-evaluation               End of Session Equipment Utilized During Treatment: Cervical collar Activity Tolerance: Patient tolerated treatment well Patient left: in bed (chair position) Nurse Communication: Other (comment) (unreliable yes/no; + 1 step commands; no vol mvmt x 4 extr)         Time: 4696-2952 PT Time Calculation (min): 18 min   Charges:   PT Evaluation $Initial PT Evaluation Tier I: 1 Procedure PT Treatments $Neuromuscular Re-education: 8-22 mins   PT G Codes:          Anthony Mcclure 08-24-2014, 9:27 AM Pager [phone]

## 2014-08-21 NOTE — Progress Notes (Addendum)
NUTRITION FOLLOW UP/CONSULT  Intervention:    Initiate TF via NGT with Vital AF 1.2 at 20 ml/hr and increase by 10 ml every 4 hours to goal rate of 65 ml/hr.   Provides 1872 kcals, 117 gm protein, 1263 ml free water daily.  Nutrition Dx:   Inadequate oral intake related to inability to eat as evidenced by NPO status, ongoing.  Goal:   Intake to meet >90% of estimated nutrition needs. Unmet.  Monitor:   TF tolerance/adequacy, weight trend, labs  Assessment:   [name] brought to ED on 9/3 after he suffered an out of hospital cardiac arrest. In ED, pt was comatose and was intubated for respiratory failure. PCCM was consulted for admission and initiation of hypothermia protocol.  TF started 9/5. Pt extubated 9/7. MD consult received to re-start TF via NG tube.  RN has not yet inserted feeding tube but will do so soon.  Rehab evaluating for possible admission.   TF off since Sunday 9/6, per conversation with RN pt had experienced some spit up prior to TF being turned off for extubation.   Height: Ht Readings from Last 1 Encounters:  08/18/14 6' (1.829 m)    Weight Status:   Wt Readings from Last 1 Encounters:  08/21/14 212 lb 1.3 oz (96.2 kg)  Admission weight: 203 lb (92.4 kg) 9/3  Body mass index is 28.76 kg/(m^2).   Re-estimated needs:  Kcal: 1850-2050 Protein: 110-135 gm Fluid: 2 L  Skin: intact  Diet Order: NPO   Intake/Output Summary (Last 24 hours) at 08/21/14 0819 Last data filed at 08/21/14 0700  Gross per 24 hour  Intake    733 ml  Output   1280 ml  Net   -547 ml    Last BM: 9/4   Labs:   Recent Labs Lab 08/16/14 2250  08/19/14 0441 08/20/14 0541 08/21/14 0300  NA 141  < > 141 145 143  K 4.1  < > 4.2 3.7 4.9  CL 105  < > 108 109 107  CO2 17*  < > 22 25 25   BUN 15  < > 16 14 14   CREATININE 0.95  < > 0.92 0.84 0.77  CALCIUM 7.6*  < > 7.5* 7.9* 8.1*  MG 1.5  --  1.5 1.9 2.1  PHOS 3.3  --  3.1  --   --   GLUCOSE 134*  < > 153* 120* 93   < > = values in this interval not displayed.  CBG (last 3)   Recent Labs  08/20/14 1731 08/20/14 2303 08/21/14 0507  GLUCAP 104* 101* 87    Scheduled Meds: . ampicillin-sulbactam (UNASYN) IV  3 g Intravenous Q6H  . antiseptic oral rinse  7 mL Mouth Rinse q12n4p  . aspirin  300 mg Rectal Daily  . chlorhexidine  15 mL Mouth Rinse BID  . feeding supplement (VITAL HIGH PROTEIN)  1,000 mL Per Tube Q24H  . folic acid  1 mg Intravenous Daily  . furosemide  20 mg Intravenous Q12H  . heparin  5,000 Units Subcutaneous 3 times per day  . metoprolol  5 mg Intravenous 4 times per day  . pantoprazole (PROTONIX) IV  40 mg Intravenous Daily  . thiamine  100 mg Intravenous Daily    Continuous Infusions: . sodium chloride Stopped (08/21/14 0700)     Maylon Peppers RD, [place], CNSC [phone] Pager [phone] After Hours Pager

## 2014-08-21 NOTE — Progress Notes (Signed)
PULMONARY / CRITICAL CARE MEDICINE  Name: Anthony Mcclure MRN: [id number] DOB: 13-Jul-1951    ADMISSION DATE:  08/16/2014 CONSULTATION DATE:  08/21/2014  REFERRING MD :  EDP  CHIEF COMPLAINT:  PEA Arrest  INITIAL PRESENTATION: 63 yo admitted 9/3 after cardiac arrest with total down time about 15 minutes before ROSC.  STUDIES / EVENTS:  9/3  Admitted with cardiac arrest, hypothermia started  9/3  CT Head / C-Spine >>> nad 9/4  TTE >>> EF 55% 9/5  Rewarming finished 9/5  EEG >>> nonspecific cerebral dsyfxn, no seizures  9/6  New finding of quadriparesis, MRI brain / C-spine ordered 9/7 MRI brain/C Spine: abn signal to cervical cord C3-4 ? Possible cord infarct or edema . Brain neg for acute process.  9/7 Passed SBT. Strong cough when suctioned. Extubated.   INTERVAL HISTORY:  No distress  VITAL SIGNS: Temp:  [99.2 F (37.3 C)-101.3 F (38.5 C)] 100.6 F (38.1 C) (09/08 0700) Pulse Rate:  [56-108] 84 (09/08 0700) Resp:  [12-27] 21 (09/08 0700) BP: (136-169)/(60-101) 160/79 mmHg (09/08 0700) SpO2:  [96 %-100 %] 97 % (09/08 0700) Arterial Line BP: (128-173)/(60-81) 166/69 mmHg (09/08 0700) FiO2 (%):  [40 %] 40 % (09/07 1154) Weight:  [96.2 kg (212 lb 1.3 oz)] 96.2 kg (212 lb 1.3 oz) (09/08 0404)  HEMODYNAMICS:    VENTILATOR SETTINGS: Vent Mode:  [-] PSV;CPAP FiO2 (%):  [40 %] 40 % PEEP:  [5 cmH20] 5 cmH20 Pressure Support:  [5 cmH20] 5 cmH20  INTAKE / OUTPUT: Intake/Output     09/07 0701 - 09/08 0700 09/08 0701 - 09/09 0700   I.V. (mL/kg) 193 (2)    NG/GT 0    IV Piggyback 550    Total Intake(mL/kg) 743 (7.7)    Urine (mL/kg/hr) 1280 (0.6)    Total Output 1280     Net -537           PHYSICAL EXAMINATION: General: No distress Neuro: Alert, quadriparetic - moves BLEs weakly, mild right hand movement HEENT: jvd up Cardiovascular: RRR, no murmurs Lungs:difffuse coarse Abdomen: Soft abdomen, bs wnl Musculoskeletal: No gross deformities, no edema,see neuro Skin:  Intact, cool, no rashes.  LABS:  CBC  Recent Labs Lab 08/19/14 0441 08/20/14 0541 08/21/14 0300  WBC 12.6* 12.9* 11.7*  HGB 13.8 12.8* 12.4*  HCT 40.8 39.3 37.3*  PLT 134* 121* 128*    Coag's  Recent Labs Lab 08/16/14 2250 08/17/14 0435 08/18/14 0930  APTT 32 37  --   INR 1.24 1.31 1.17   BMET  Recent Labs Lab 08/19/14 0441 08/20/14 0541 08/21/14 0300  NA 141 145 143  K 4.2 3.7 4.9  CL 108 109 107  CO2 _0 BUN _1 CREATININE 0.92 0.84 0.77  GLUCOSE 153* 120* 93   Electrolytes  Recent Labs Lab 08/16/14 2250  08/19/14 0441 08/20/14 0541 08/21/14 0300  CALCIUM 7.6*  < > 7.5* 7.9* 8.1*  MG 1.5  --  1.5 1.9 2.1  PHOS 3.3  --  3.1  --   --   < > = values in this interval not displayed. Sepsis Markers  Recent Labs Lab 08/16/14 2250 08/17/14 0335 08/17/14 1000  LATICACIDVEN 4.3* 2.1 1.7   ABG  Recent Labs Lab 08/16/14 2048 08/17/14 0059 08/18/14 0452  PHART 7.257* 7.369 7.457*  PCO2ART 35.0 24.2* 26.8*  PO2ART 98.0 156.0* 106.0*   Liver Enzymes  Recent Labs Lab 08/18/14 0419 08/19/14 0441 08/20/14 0541  AST 187* 120*  85*  ALT 135* 110* 84*  ALKPHOS 56 64 62  BILITOT 1.7* 1.1 1.2  ALBUMIN 2.7* 2.7* 2.7*   Cardiac Enzymes  Recent Labs Lab 08/16/14 2250 08/17/14 0330 08/17/14 0915  TROPONINI <0.30 <0.30 <0.30   Glucose  Recent Labs Lab 08/19/14 2119 08/20/14 0529 08/20/14 1141 08/20/14 1731 08/20/14 2303 08/21/14 0507  GLUCAP 131* 116* 117* 104* 101* 23   IMAGING:  Mr Kizzie Fantasia Contrast  08/19/2014   CLINICAL DATA:  63 year old hypertensive male with history of alcohol abuse, alcohol withdrawal seizures, hepatitis and atrial flutter. Found down and received CPR. Unable to move upper extremities post CPR. Old head injury.  EXAM: MRI HEAD WITHOUT AND WITH CONTRAST  TECHNIQUE: Multiplanar, multiecho pulse sequences of the brain and surrounding structures were obtained without and with intravenous contrast.   CONTRAST:  20 cc MultiHance.  COMPARISON:  MR cervical spine performed the same date and dictated separately.  Head CT 08/16/2014.  No comparison brain MR.  FINDINGS: Some the sequences are motion degraded.  No acute infarct  No intracranial hemorrhage.  Global atrophy. Ventricular size may be related to atrophy rather than hydrocephalus.  No intracranial mass or abnormal enhancement.  No MR evidence of Wernicke's encephalopathy.  Major intracranial vascular structures are patent.  Abnormal appearance of the upper cervical spine better delineated on recent cervical spine MR. Please see report from such. An additional consideration is that the patient had a traumatic event (such as a fall) which caused cord contusion contributing to patient's inability to move extremities. Subacute combined degeneration/ vitamin D deficiency contributing to the cervical cord changes although possible given the patient's history of alcohol use is felt unlikely as cause of the current findings.  Prominent pannus.  This contributes to baseline spinal stenosis.  Left frontal scalp lipoma.  IMPRESSION: Some the sequences are motion degraded.  No acute infarct  No intracranial hemorrhage.  Global atrophy. Ventricular size may be related to atrophy rather than hydrocephalus.  No intracranial mass or abnormal enhancement.  No MR evidence of Wernicke's encephalopathy.  Abnormal appearance of the upper cervical spine better delineated on recent cervical spine MR. Please see report from such. An additional consideration is that the patient had a traumatic event (such as a fall) which caused cord contusion contributing to patient's inability to move extremities.   Electronically Signed   By: Chauncey Cruel M.D.   On: 08/19/2014 17:56   Mr Cervical Spine W Wo Contrast  08/19/2014   CLINICAL DATA:  Found down hypothermic, received CPR. Pneumonia. Not moving upper extremities. Ventilator. Sedated.  EXAM: MRI CERVICAL SPINE WITHOUT AND WITH CONTRAST   TECHNIQUE: Multiplanar and multiecho pulse sequences of the cervical spine, to include the craniocervical junction and cervicothoracic junction, were obtained according to standard protocol without and with intravenous contrast.  CONTRAST:  25m MULTIHANCE GADOBENATE DIMEGLUMINE 529 MG/ML IV SOLN  COMPARISON:  08/16/2014  FINDINGS: Despite efforts by the technologist and patient, motion artifact is present on today's exam and could not be eliminated. This reduces exam sensitivity and specificity.  Chronic degenerative findings and pannus posterior to the odontoid with likely chronic erosion of the odontoid, causing exaggerated curvature of the thecal sac at the craniocervical junction. Low-grade enhancement surrounds this pannus. Abnormal but low grade osseous edema noted anteriorly in C1 and C2.  The patient is orally intubated.  Abnormal increased T2 signal in the cord extends from the skull base through the C3-4 level, especially centrally, and most severely at the C1  vertebral level. No associated enhancement in the cord to suggest that this is due to high-grade tumor. Gradient images are severely limited by motion artifact.  There is reversal of the normal cervical lordosis with loss of intervertebral disc height at C6-7 and C7-T 1. Multilevel degenerative endplate findings especially at C7-T1. Diffuse low-grade third spacing of fluid in the neck.  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  Additional findings at individual levels are as follows:  C1-2: Pannus and exaggerated curvature at the cervicothoracic junction cause moderate central narrowing of the thecal sac. Spurring from C1 causes at least mild bilateral foraminal stenosis, and there is a posterior fusion of the left C1-2 articulation.  C2-3: Prominent left and moderate right foraminal stenosis with moderate central narrowing of the thecal sac due to posterior osseous ridging, disc bulge, and facet arthropathy.  C3-4: Prominent  left and moderate right foraminal stenosis with mild central narrowing of the thecal sac due to uncinate spurring facet arthropathy, and disc bulge.  C4-5: Moderate right and mild left foraminal stenosis due to uncinate and facet spurring.  C5-6: Moderate left foraminal stenosis due to facet arthropathy and uncinate spurring.  C6-7: Moderate bilateral foraminal stenosis due to uncinate and facet spurring along with mild disc bulge.  C7-T1: Prominent bilateral foraminal stenosis due to posterior osseous ridging, uncinate spurring, and facet arthropathy.  IMPRESSION: 1. Abnormal signal in the cervical cord extending from the craniocervical junction through the C3-4 level, possibly from cord infarct, cord edema, or chronic myelomalacia. No associated cord enhancement to suggest high-grade tumor. 2. Chronic degenerative findings at C1-2 and C2-3 with considerable pannus posterior to the odontoid, causing exaggerated curvature of the cord and central narrowing of the thecal sac. 3. Cervical spondylosis and degenerative disc disease is also present. Overall associated impingement is graded as prominent at C2-3, C3-4, and C7-T1; and moderate at C1-2, C4-5, C5-6, and C6-7, as detailed above.   Electronically Signed   By: Sherryl Barters M.D.   On: 08/19/2014 16:55   Dg Chest Port 1 View  08/20/2014   CLINICAL DATA:  Acute respiratory failure. On ventilator. Status post cardiac arrest.  EXAM: PORTABLE CHEST - 1 VIEW  COMPARISON:  08/19/2014  FINDINGS: Mild cardiomegaly is stable. Increased opacity is seen in the right lower lobe silhouetting the right hemidiaphragm. No evidence of pleural effusion or pneumothorax. Support lines and tubes in appropriate position.  IMPRESSION: Increased right lower lobe infiltrate.  Stable mild cardiomegaly.   Electronically Signed   By: Earle Gell M.D.   On: 08/20/2014 09:15   Dg Abd Portable 1v  08/20/2014   CLINICAL DATA:  Feeding tube placement  EXAM: PORTABLE ABDOMEN - 1 VIEW   COMPARISON:  CT abdomen pelvis dated 06/14/2009  FINDINGS: Enteric tube terminates in the distal gastric antrum.  Nonspecific bowel gas pattern, but without disproportionate small bowel dilatation to suggest small bowel obstruction.  Degenerative changes of the lumbar spine.  IMPRESSION: Enteric tube terminates in the distal gastric antrum.   Electronically Signed   By: Julian Hy M.D.   On: 08/20/2014 00:36   ASSESSMENT / PLAN:  PULMONARY A: Acute respiratory failure in the setting of PEA cardiac arrest, intubated 9/3 >> 9/07 Mild edema improved Poor airway control P:   NTS if needed.  Titrate O2 for sat >90%  High risk of re-intubation due to poor cough reflex- remain in icu If re intubated - then trach indictaed  CARDIOVASCULAR A:  PEA arrest, downtime 30 minutes  before ROSC Hx PAF, HTN, chronic diastolic heart failure  Tachycardia  pulm edema P:  Goal MAP > 60- met Lopressor  Hydralazine PRN Tele lasix  RENAL A:   AKI P:   Trend BMP in am  Lasix to neg balance further Free water 100 q8h - remain  GASTROINTESTINAL A:   Nutrition GI Px dysphagia P:   NPO TF to start after NGT placement Protonix slp in future when able  HEMATOLOGIC A:   VTE Prophylaxis P:  Limit blood draws Heparin  INFECTIOUS A:   Possible aspiration pneumonia 9/6 BC x 2 >> 9/6 Sputum >> P:   Unasyn 9/6 >>establish stop date consider 7 days empiric given hypothermia risks  ENDOCRINE A:   Hypoglycemia-improving  P:   CBG  NEUROLOGIC A:   Quadriplegic -MRI 9/6 w/ C7 cord infact -not surgical or steroid candidate  Acute metabolic encephalopathy Possible anoxia Hx seizures, not on outpatient medications Hx alcohol abuse, intoxication  P:   Fentanyl PRN Thiamine / Folate -spine collar - Appreciate Neuro assistance  -PT when able -OT  Lavon Paganini. Titus Mould, MD, [place] Pgr: [facility]

## 2014-08-21 NOTE — Progress Notes (Signed)
Subjective: Extubated  Exam: Filed Vitals:   08/21/14 1300  BP: 179/90  Pulse: 64  Temp: 100.6 F (38.1 C)  Resp: 18   Gen: In bed, NAD MS: Able to shake head and nod, though responses slightly unreliable. Anthony Mcclure to tell me his name.  Motor: has intermittent sponaneous triple flexion of the bilateral legs.  Sensory: does not endorse sensation to touch or vibration in his legs.  RAX:ENMMH, triple flexion to babinski bilaterally  Impression: 63 yo M with cord injury and edema. Cord infarct is certainly possible if the stenosis caused compromise of a vessel. Abnormal wall movement during arrest could also lead to thrombus formation with embolic event, though I feel that this is less likely.   I discussed his case with Dr. Vertell Limber of neurosurgery who reviewed the images and stated that he did not see any active compression and therefore did not feel that there was a role for acute surgery or steroids.   His mental status is of some concern and there may have been some hypoxic injury there as well, though this is not clear to me yet.   Recommendations: 1) ASA suppository 2) echo showed no clear thrombus.  3)will likely need, ot, pt, ST  Roland Rack, MD [facility] [phone]  If 7pm- 7am, please page neurology on call as listed in [place].

## 2014-08-21 NOTE — Progress Notes (Signed)
PRN hydralazine given for hypertension. Will continue to monitor closely and update as needed.

## 2014-08-21 NOTE — Evaluation (Signed)
Occupational Therapy Evaluation Patient Details Name: Anthony Mcclure MRN: [id number] DOB: 1951/07/31 Today's Date: 08/21/2014    History of Present Illness 63 yo adm 08/16/14 s/p cardiac arrest with 15-30 minute prior to return of spontaneous circulation (ROSC). Pt intubated and underwent hypothermia protocol. 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 MRI c-spine showed abnormal signal in the cord from the craniocervical junction through C3-4 level. PMHx-CHF, hepatitis C, ETOH abuse, substance abuse, seizures, forearm reconstruction   Clinical Impression   Pt admitted with above. He demonstrates the below listed deficits and will benefit from continued OT to maximize safety and independence with BADLs.  Pt presents to OT with quadraplegia.  Bil. UEs 0/5.   Pt unable to communicate - yes/no responses are inconsistent.  He does attempt to mouth words, but unable to determine what it is he is saying.  He is total A with all ADLs.  Recommend CIR at this time.    Follow Up Recommendations  CIR    Equipment Recommendations  None recommended by OT (will be dependent on discharge plan)    Recommendations for Other Services Rehab consult     Precautions / Restrictions Precautions Precautions: Cervical;Fall Required Braces or Orthoses: Cervical Brace Cervical Brace: Hard collar;At all times      Mobility Bed Mobility Overal bed mobility: Needs Assistance Bed Mobility: Rolling Rolling: Total assist         General bed mobility comments: Pt in partial chair position in bed.  HOB ~50*;   BP 187/98.  Pt unable to assist with any aspects of bed mobility   Transfers                 General transfer comment: Pt unable     Balance Overall balance assessment:  (TBA, however anticipate total assist)                                          ADL Overall ADL's : Needs assistance/impaired Eating/Feeding: NPO   Grooming: Wash/dry hands;Wash/dry face;Oral  care;Brushing hair;Applying deodorant;Total assistance   Upper Body Bathing: Total assistance   Lower Body Bathing: Total assistance   Upper Body Dressing : Total assistance   Lower Body Dressing: Total assistance   Toilet Transfer: Total assistance   Toileting- Clothing Manipulation and Hygiene: Total assistance   Tub/ Shower Transfer: Total assistance   Functional mobility during ADLs: Total assistance General ADL Comments: Pt unable to assist with any aspects of BADLs.  Pt with 0/5 strength bil. UEs     Vision                 Additional Comments: Ubnable to accurately assess   Perception Perception Spatial deficits: unable to assess   Praxis Praxis Praxis tested?: Not tested Praxis-Other Comments: unable to assess    Pertinent Vitals/Pain Pain Assessment: Faces Pain Score: 0-No pain Faces Pain Scale: No hurt     Hand Dominance     Extremity/Trunk Assessment Upper Extremity Assessment Upper Extremity Assessment: RUE deficits/detail;LUE deficits/detail RUE Deficits / Details: Flaccid.  No activation of shoulder musculature to command.  Consistently withrdaws to pain with nailbed pressure. Minimal  Clonus noted with PROM of shoulder.   PROM WFL except sup and pronation - pt with multiple scars on forearm and with h/o forearm reconstruction   RUE Sensation:  (Pt unable to participate in sensory exam due to cognition ) RUE Coordination:  decreased fine motor;decreased gross motor LUE Deficits / Details: Flaccid. No activation of shoulder musculature to command.  Withdraws inconsistently to pain (nailbed pressure).  PROM WFL  LUE Sensation:  (unable to perform sensory assessment due to cognition ) LUE Coordination: decreased fine motor;decreased gross motor   Lower Extremity Assessment Lower Extremity Assessment: Defer to PT evaluation RLE Deficits / Details: PROM WFL; spontaneous flexor spasms noted (hip, knee flexion with dorsiflexion); pt unable to elicit any  movements on command; + brisk withdrawal to pain (nailbed pressure) LLE Deficits / Details: PROM WFL; spontaneous flexor spasms noted (hip, knee flexion with dorsiflexion); pt unable to elicit any movements on command; + brisk withdrawal to pain (nailbed pressure)   Cervical / Trunk Assessment Cervical / Trunk Assessment: Other exceptions Cervical / Trunk Exceptions: cervical collar in place.  Did not assess   Communication Communication Communication: Expressive difficulties   Cognition Arousal/Alertness: Awake/alert Behavior During Therapy: WFL for tasks assessed/performed Overall Cognitive Status: Difficult to assess                     General Comments       Exercises Exercises: Other exercises Other Exercises Other Exercises: PROM all extremities, planes of motion Other Exercises: PROM bil. UEs   Shoulder Instructions      Home Living Family/patient expects to be discharged to:: Unsure                                 Additional Comments: Pt mouthing words.  but, unable to determine what he is trying to say.  Yes/no responses are inconsistent.  Per RN, pt was transient and moved from friend's home to friend's home and did not have a stable living environment       Prior Functioning/Environment Level of Independence: Independent        Comments: Pt is believed to be independent with at least BADLs and mobility.  Pt with h/o ETOH abuse    OT Diagnosis: Generalized weakness;Acute pain;Cognitive deficits;Paresis   OT Problem List: Decreased strength;Decreased range of motion;Decreased activity tolerance;Impaired balance (sitting and/or standing);Decreased cognition;Decreased coordination;Decreased safety awareness;Decreased knowledge of use of DME or AE;Decreased knowledge of precautions;Impaired sensation;Impaired tone;Impaired UE functional use   OT Treatment/Interventions: Self-care/ADL training;Therapeutic exercise;Neuromuscular education;DME  and/or AE instruction;Splinting;Therapeutic activities;Cognitive remediation/compensation;Patient/family education;Balance training    OT Goals(Current goals can be found in the care plan section) Acute Rehab OT Goals Patient Stated Goal: uanble to state/communicate OT Goal Formulation: Patient unable to participate in goal setting Time For Goal Achievement: 09/04/14 Potential to Achieve Goals: Fair ADL Goals Additional ADL Goal #1: Pt will tolerate EOB sitting x 7 mins without evidence of orthosasis in prep for transfers to chair Additional ADL Goal #2: Family will be independent with PROM bil. UEs Additional ADL Goal #3: Pt will tolerate splint wear without evidence of skin breakdown   OT Frequency: Min 2X/week   Barriers to D/C: Decreased caregiver support          Co-evaluation              End of Session Equipment Utilized During Treatment: Cervical collar Nurse Communication: Mobility status  Activity Tolerance: Patient tolerated treatment well Patient left: in bed;with call bell/phone within reach;with nursing/sitter in room   Time: 5784-6962 OT Time Calculation (min): 20 min Charges:  OT General Charges $OT Visit: 1 Procedure OT Evaluation $Initial OT Evaluation Tier I: 1 Procedure OT Treatments $Therapeutic  Activity: 8-22 mins G-Codes:    Anthony Mcclure M 02-Sep-2014, 11:57 AM

## 2014-08-21 NOTE — Evaluation (Signed)
Clinical/Bedside Swallow Evaluation Patient Details  Name: Anthony Mcclure MRN: [id number] Date of Birth: 06/22/1951  Today's Date: 08/21/2014 Time: 1215-1225 SLP Time Calculation (min): 10 min  Past Medical History:  Past Medical History  Diagnosis Date  . CHF (congestive heart failure)   . Alcohol abuse   . Seizure   . Noncompliance with medication treatment due to intermittent use of medication 02/02/2012  . HTN (hypertension) 02/02/2012  . Alcohol withdrawal seizure 02/02/2012  . Hypertension   . Dysrhythmia     paroxismal atrial fib  . Paroxysmal Atrial Flutter 02/01/2012  . Hepatitis C 02/02/2012    Unsure if he was ever diagnosed with Hep C  . Hepatitis C    Past Surgical History:  Past Surgical History  Procedure Laterality Date  . Forearm reconstruction     HPI:  [name] brought to ED on 9/3 after he suffered an out of hospital cardiac arrest. No CPR was performed until 10-15 minutes later when EMS arrived. In ED, pt was comatose and was intubated for respiratory failure, hypothermia protocol initiated. Following intubation, he became hypotensive. MRI brain/C Spine: abn signal to cervical cord C3-4 ? Possible cord infarct or edema . Brain neg for acute process.  EEG is consistent with a generalized nonspecific cerebral dysfunction (encephalopathy). Pt intubated from 9/3-9/7.    Assessment / Plan / Recommendation Clinical Impression  Pt demonstrates signs of a severe dysphagia with high risk of aspiration. Pt has little to no ability to cough or manage secretions at baseline with bloody mucous suctioned from oral cavity prior to trials. Pt also with decreased awareness and oral manipulation of single presentation of ice chip despite max cueing,  Pt was able to initiate swallow response but with increase in audible wet respirations that did not clear despite multiple swallows highly concerning for limited airway protection. Recommend pt continue Tani term alternate nutrition. SLP  will follow for PO readiness.     Aspiration Risk  Severe    Diet Recommendation Alternative means - temporary;NPO   Medication Administration: Via alternative means    Other  Recommendations Oral Care Recommendations: Oral care Q4 per protocol   Follow Up Recommendations       Frequency and Duration min 3x week  2 weeks   Pertinent Vitals/Pain NA    SLP Swallow Goals     Swallow Study Prior Functional Status       General HPI: [name] brought to ED on 9/3 after he suffered an out of hospital cardiac arrest. No CPR was performed until 10-15 minutes later when EMS arrived. In ED, pt was comatose and was intubated for respiratory failure, hypothermia protocol initiated. Following intubation, he became hypotensive. MRI brain/C Spine: abn signal to cervical cord C3-4 ? Possible cord infarct or edema . Brain neg for acute process.  EEG is consistent with a generalized nonspecific cerebral dysfunction (encephalopathy). Pt intubated from 9/3-9/7.  Type of Study: Bedside swallow evaluation Previous Swallow Assessment: none in chart Diet Prior to this Study: NPO;Panda Temperature Spikes Noted: Yes Respiratory Status: Room air History of Recent Intubation: Yes Length of Intubations (days): 4 days Date extubated: 08/20/14 Behavior/Cognition: Alert;Confused;Distractible;Requires cueing;Doesn't follow directions;Decreased sustained attention Oral Cavity - Dentition: Adequate natural dentition Self-Feeding Abilities: Total assist Patient Positioning: Upright in bed Baseline Vocal Quality: Aphonic (wet respirations) Volitional Cough: Cognitively unable to elicit Volitional Swallow: Unable to elicit    Oral/Motor/Sensory Function Overall Oral Motor/Sensory Function: Appears within functional limits for tasks assessed   Ice  Chips Ice chips: Impaired Presentation: Spoon Oral Phase Impairments: Reduced labial seal;Poor awareness of bolus Pharyngeal Phase Impairments: Decreased  hyoid-laryngeal movement;Other (comments) (multiple swallow, wet respirations)   Thin Liquid Thin Liquid: Not tested    Nectar Thick Nectar Thick Liquid: Not tested   Honey Thick Honey Thick Liquid: Not tested   Puree Puree: Not tested   Solid   GO    Solid: Not tested      Anthony [place], MA CCC-SLP [phone]  Anthony Mcclure, Anthony Mcclure 08/21/2014,1:31 PM

## 2014-08-22 ENCOUNTER — Inpatient Hospital Stay (HOSPITAL_COMMUNITY): Payer: Medicare Other

## 2014-08-22 LAB — MAGNESIUM: Magnesium: 1.9 mg/dL (ref 1.5–2.5)

## 2014-08-22 LAB — BASIC METABOLIC PANEL
ANION GAP: 10 (ref 5–15)
BUN: 19 mg/dL (ref 6–23)
CHLORIDE: 109 meq/L (ref 96–112)
CO2: 29 mEq/L (ref 19–32)
Calcium: 8.8 mg/dL (ref 8.4–10.5)
Creatinine, Ser: 0.81 mg/dL (ref 0.50–1.35)
GFR calc Af Amer: >90 mL/min (ref >90)
GFR calc non Af Amer: >90 mL/min (ref >90)
Glucose, Bld: 133 mg/dL — ABNORMAL HIGH (ref 70–99)
Potassium: 3.6 mEq/L — ABNORMAL LOW (ref 3.7–5.3)
Sodium: 148 mEq/L — ABNORMAL HIGH (ref 137–147)

## 2014-08-22 LAB — GLUCOSE, CAPILLARY
GLUCOSE-CAPILLARY: 130 mg/dL — AB (ref 70–99)
GLUCOSE-CAPILLARY: 145 mg/dL — AB (ref 70–99)
Glucose-Capillary: 119 mg/dL — ABNORMAL HIGH (ref 70–99)
Glucose-Capillary: 135 mg/dL — ABNORMAL HIGH (ref 70–99)
Glucose-Capillary: 142 mg/dL — ABNORMAL HIGH (ref 70–99)
Glucose-Capillary: 150 mg/dL — ABNORMAL HIGH (ref 70–99)

## 2014-08-22 LAB — CULTURE, RESPIRATORY W GRAM STAIN

## 2014-08-22 LAB — PHOSPHORUS: Phosphorus: 3.3 mg/dL (ref 2.3–4.6)

## 2014-08-22 LAB — CULTURE, RESPIRATORY

## 2014-08-22 MED ORDER — MAGNESIUM SULFATE 50 % IJ SOLN: 2.0000 g | Freq: Once | INTRAVENOUS | Status: DC

## 2014-08-22 MED ORDER — POTASSIUM CHLORIDE 20 MEQ/15ML (10%) PO LIQD
40.0000 meq | Freq: Once | ORAL | Status: AC
  Administered 2014-08-22: 40 meq
  Filled 2014-08-22: qty 30

## 2014-08-22 MED ORDER — MAGNESIUM SULFATE 40 MG/ML IJ SOLN
2.0000 g | Freq: Once | INTRAMUSCULAR | Status: AC
  Administered 2014-08-22: 2 g via INTRAVENOUS
  Filled 2014-08-22: qty 50

## 2014-08-22 NOTE — Progress Notes (Signed)
Speech Language Pathology Treatment: Dysphagia  Patient Details Name: Garvis Downum MRN: [id number] DOB: February 01, 1951 Today's Date: 08/22/2014 Time: 9179-1505 SLP Time Calculation (min): 18 min  Assessment / Plan / Recommendation Clinical Impression  Pt. demonstrated increased awareness (mild-mod) and followed 4/4 one step directions with intermittent repetition but no additional cueing needed.  Deliberate acceptance of ice chips/water via spoon with decreased oral control and inconsistent anterior labial leakage.  Evidence of poor tracheal protection with delayed cough (mild reflexive vocalization).  Mouthing words although unintelligible.  Verbal stimuli to elicit phonation led to brief and minimal vocalization x 1.  Pt. Making slow steady progress, however not appropriate for po's/objective assessment yet.  ST will continue to follow.   HPI HPI: [name] brought to ED on 9/3 after he suffered an out of hospital cardiac arrest. No CPR was performed until 10-15 minutes later when EMS arrived. In ED, pt was comatose and was intubated for respiratory failure, hypothermia protocol initiated. Following intubation, he became hypotensive. MRI brain/C Spine: abn signal to cervical cord C3-4 ? Possible cord infarct or edema . Brain neg for acute process.  EEG is consistent with a generalized nonspecific cerebral dysfunction (encephalopathy). Pt intubated from 9/3-9/7.    Pertinent Vitals Pain Assessment: No/denies pain  SLP Plan  Continue with current plan of care    Recommendations Diet recommendations: NPO Medication Administration: Via alternative means              Oral Care Recommendations:  (per protocol) Follow up Recommendations:  (TBD) Plan: Continue with current plan of care         Houston Siren 08/22/2014, 8:22 AM  Orbie Pyo Colvin Caroli.Ed [employer] [phone]

## 2014-08-22 NOTE — Progress Notes (Signed)
Lower abdominal distention noted on 0400 reassessment.  Bladder scan revealed >281mL.  Foley huddle completed.  Foley catheter placed.  123mL of clear, amber urine returned.  Lower abdomen no longer distended.

## 2014-08-22 NOTE — Progress Notes (Signed)
NUTRITION FOLLOW UP  Intervention:   Continue Vital AF 1.2 at 65 ml/hr via NG tube   Provides 1872 kcals, 117 gm protein, 1263 ml free water daily.  Nutrition Dx:   Inadequate oral intake related to inability to eat as evidenced by NPO status, ongoing.  Goal:   Intake to meet >90% of estimated nutrition needs. met.  Monitor:   TF tolerance/adequacy, weight trend, labs  Assessment:   [name] brought to ED on 9/3 after he suffered an out of hospital cardiac arrest. In ED, pt was comatose and was intubated for respiratory failure. PCCM was consulted for admission and initiation of hypothermia protocol.  TF started 9/5. Pt extubated 9/7. Feeding tube re-inserted 9/8 (tip in the midbody of the stomach) and TF started. Pt failed bedside swallow evaluation 9/9; SLP continues to follow pt.  Pt with hx of ETOH abuse and is prescribed folic acid and thiamine Per RN pt tolerating TF without issues.   Sodium elevated Potassium low Pt receiving magnesium supplementation  Pt currently with no free water ordered   Height: Ht Readings from Last 1 Encounters:  08/18/14 6' (1.829 m)    Weight Status:   Wt Readings from Last 1 Encounters:  08/22/14 215 lb 9.8 oz (97.8 kg)  Admission weight: 203 lb (92.4 kg) 9/3  Body mass index is 29.24 kg/(m^2).   Re-estimated needs:  Kcal: 1850-2050 Protein: 110-135 gm Fluid: 2 L  Skin: intact  Diet Order: NPO   Intake/Output Summary (Last 24 hours) at 08/22/14 0959 Last data filed at 08/22/14 0803  Gross per 24 hour  Intake   2010 ml  Output   3245 ml  Net  -1235 ml    Last BM: 9/9   Labs:   Recent Labs Lab 08/16/14 2250  08/19/14 0441 08/20/14 0541 08/21/14 0300 08/22/14 0635  NA 141  < > 141 145 143 148*  K 4.1  < > 4.2 3.7 4.9 3.6*  CL 105  < > 108 109 107 109  CO2 17*  < > _0 BUN 15  < > _1 CREATININE 0.95  < > 0.92 0.84 0.77 0.81  CALCIUM 7.6*  < > 7.5* 7.9* 8.1* 8.8  MG 1.5  --  1.5 1.9 2.1  1.9  PHOS 3.3  --  3.1  --   --  3.3  GLUCOSE 134*  < > 153* 120* 93 133*  < > = values in this interval not displayed.  CBG (last 3)   Recent Labs  08/22/14 0024 08/22/14 0520 08/22/14 0747  GLUCAP 142* 119* 130*    Scheduled Meds: . ampicillin-sulbactam (UNASYN) IV  3 g Intravenous Q6H  . antiseptic oral rinse  7 mL Mouth Rinse q12n4p  . aspirin  300 mg Rectal Daily  . chlorhexidine  15 mL Mouth Rinse BID  . folic acid  1 mg Intravenous Daily  . furosemide  20 mg Intravenous Q12H  . heparin  5,000 Units Subcutaneous 3 times per day  . metoprolol  5 mg Intravenous 4 times per day  . pantoprazole (PROTONIX) IV  40 mg Intravenous Daily  . thiamine  100 mg Intravenous Daily  . vancomycin  1,000 mg Intravenous Q8H    Continuous Infusions: . sodium chloride Stopped (08/21/14 0700)  . feeding supplement (VITAL AF 1.2 CAL) 1,000 mL (08/22/14 0800)     [place], [place], [place] Pager [phone] After Hours Pager

## 2014-08-22 NOTE — Progress Notes (Signed)
PULMONARY / CRITICAL CARE MEDICINE  Name: Anthony Mcclure MRN: [id number] DOB: Mar 03, 1951    ADMISSION DATE:  08/16/2014 CONSULTATION DATE:  08/22/2014  REFERRING MD :  EDP  CHIEF COMPLAINT:  PEA Arrest  INITIAL PRESENTATION: 63 yo admitted 9/3 after cardiac arrest with total down time about 15 minutes before ROSC.  STUDIES / EVENTS:  9/3  Admitted with cardiac arrest, hypothermia started  9/3  CT Head / C-Spine >>> nad 9/4  TTE >>> EF 55% 9/5  Rewarming finished 9/5  EEG >>> nonspecific cerebral dsyfxn, no seizures  9/6  New finding of quadriparesis, MRI brain / C-spine ordered 9/7 MRI brain/C Spine: abn signal to cervical cord C3-4 ? Possible cord infarct or edema . Brain neg for acute process.  9/7 Passed SBT. Strong cough when suctioned. Extubated.   INTERVAL HISTORY:  No distress, on room  VITAL SIGNS: Temp:  [98.2 F (36.8 C)-100.9 F (38.3 C)] 98.2 F (36.8 C) (09/09 0743) Pulse Rate:  [54-96] 70 (09/09 1000) Resp:  [14-26] 17 (09/09 1000) BP: (118-187)/(73-111) 131/73 mmHg (09/09 1000) SpO2:  [93 %-99 %] 93 % (09/09 1000) Weight:  [97.8 kg (215 lb 9.8 oz)] 97.8 kg (215 lb 9.8 oz) (09/09 0400)  HEMODYNAMICS:    VENTILATOR SETTINGS:    INTAKE / OUTPUT: Intake/Output     09/08 0701 - 09/09 0700 09/09 0701 - 09/10 0700   I.V. (mL/kg)     NG/GT 680 160   IV Piggyback 1100 200   Total Intake(mL/kg) 1780 (18.2) 360 (3.7)   Urine (mL/kg/hr) 3845 (1.6) 400 (1.3)   Total Output 3845 400   Net -2065 -40        Urine Occurrence 1 x    Stool Occurrence 1 x     PHYSICAL EXAMINATION: General: No distress Neuro: Alert, quadriparetic - moves BLEs weakly HEENT: jvd noted Cardiovascular: RRR, no murmurs Lungs:difffuse coarse Abdomen: Soft abdomen, bs wnl Musculoskeletal: No gross deformities, no edema,see neuro Skin: Intact, cool, no rashes.  LABS:  CBC  Recent Labs Lab 08/19/14 0441 08/20/14 0541 08/21/14 0300  WBC 12.6* 12.9* 11.7*  HGB 13.8 12.8* 12.4*   HCT 40.8 39.3 37.3*  PLT 134* 121* 128*    Coag's  Recent Labs Lab 08/16/14 2250 08/17/14 0435 08/18/14 0930  APTT 32 37  --   INR 1.24 1.31 1.17   BMET  Recent Labs Lab 08/20/14 0541 08/21/14 0300 08/22/14 0635  NA 145 143 148*  K 3.7 4.9 3.6*  CL 109 107 109  CO2 25 25 29   BUN 14 14 19   CREATININE 0.84 0.77 0.81  GLUCOSE 120* 93 133*   Electrolytes  Recent Labs Lab 08/16/14 2250  08/19/14 0441 08/20/14 0541 08/21/14 0300 08/22/14 0635  CALCIUM 7.6*  < > 7.5* 7.9* 8.1* 8.8  MG 1.5  --  1.5 1.9 2.1 1.9  PHOS 3.3  --  3.1  --   --  3.3  < > = values in this interval not displayed. Sepsis Markers  Recent Labs Lab 08/16/14 2250 08/17/14 0335 08/17/14 1000  LATICACIDVEN 4.3* 2.1 1.7   ABG  Recent Labs Lab 08/16/14 2048 08/17/14 0059 08/18/14 0452  PHART 7.257* 7.369 7.457*  PCO2ART 35.0 24.2* 26.8*  PO2ART 98.0 156.0* 106.0*   Liver Enzymes  Recent Labs Lab 08/18/14 0419 08/19/14 0441 08/20/14 0541  AST 187* 120* 85*  ALT 135* 110* 84*  ALKPHOS 56 64 62  BILITOT 1.7* 1.1 1.2  ALBUMIN 2.7* 2.7* 2.7*   Cardiac  Enzymes  Recent Labs Lab 08/16/14 2250 08/17/14 0330 08/17/14 0915  TROPONINI <0.30 <0.30 <0.30   Glucose  Recent Labs Lab 08/21/14 1117 08/21/14 1615 08/21/14 2111 08/22/14 0024 08/22/14 0520 08/22/14 0747  GLUCAP 105* 102* 125* 142* 119* 130*   IMAGING:  Dg Chest Port 1 View  08/22/2014   CLINICAL DATA:  Shortness of breath.  Evaluate atelectasis  EXAM: PORTABLE CHEST - 1 VIEW  COMPARISON:  08/21/2014; 08/20/2014; 08/19/2014  FINDINGS: Grossly unchanged enlarged cardiac silhouette. Bilateral infrahilar heterogeneous opacities are unchanged. No new focal airspace opacities. No evidence of edema. No pleural effusion. A skin fold overlies the right upper lobe. No pleural effusion. Unchanged bones.  IMPRESSION: 1. Grossly unchanged bilateral infrahilar heterogeneous opacities, likely atelectasis. 2. A skin fold overlies  the right upper lung. No definite pneumothorax. Further evaluation could be performed with PA and lateral chest radiograph as clinically indicated.   Electronically Signed   By: Sandi Mariscal M.D.   On: 08/22/2014 07:47   Dg Chest Port 1 View  08/21/2014   CLINICAL DATA:  Extubated patient.  EXAM: PORTABLE CHEST - 1 VIEW  COMPARISON:  08/20/2014  FINDINGS: Since the prior study, the endotracheal tube and nasogastric tube have been removed. The right internal jugular central venous catheter remains unchanged.  Right lung base opacity has improved. No new areas of lung opacity. No pulmonary edema. No pleural effusion or pneumothorax on this semi-erect study.  Cardiac silhouette is normal in size. No mediastinal or hilar masses.  IMPRESSION: 1. Improved right lung base opacity consistent with an improved infiltrate or improved atelectasis. 2. Status post extubation and removal of the nasogastric tube.   Electronically Signed   By: Lajean Manes M.D.   On: 08/21/2014 07:37   Dg Abd Portable 1v  08/21/2014   CLINICAL DATA:  Feeding tube placement.  EXAM: PORTABLE ABDOMEN - 1 VIEW  COMPARISON:  Single view of the abdomen 08/19/2014.  FINDINGS: Feeding tube is in place with the tip projecting inferiorly along the midbody of the stomach.  IMPRESSION: As above.   Electronically Signed   By: Inge Rise M.D.   On: 08/21/2014 12:53   ASSESSMENT / PLAN:  PULMONARY A: Acute respiratory failure in the setting of PEA cardiac arrest, intubated 9/3 >> 9/07 Mild edema improved airway control concerns P:   Follow sats Neg balnce  CARDIOVASCULAR A:  PEA arrest, downtime 30 minutes before ROSC Hx PAF, HTN, chronic diastolic heart failure  Tachycardia  pulm edema P:  Lopressor  Hydralazine PRN Tele Lasix maintain , did well with this  RENAL A:   AKI resolved Hypomagnesemia Neg 2 liters 9/9 P:   Trend BMP in am  Lasix to neg balance further Free water 100 q8h - remain mag  supp  GASTROINTESTINAL A:   Nutrition GI Px dysphagia P:   TF with strict asp precuations Protonix slp repeat in future  HEMATOLOGIC A:   VTE Prophylaxis P:  Heparin sub q  INFECTIOUS A:   Possible aspiration pneumonia 9/6 BC x 2 >> 9/6 Sputum >>MRSA P:   unasyn 9/6>>>9/9 vanc 9/7>>>consider 8 days  ENDOCRINE A:   Hypoglycemia-improving  P:   CBG  NEUROLOGIC A:   Quadriplegic -MRI 9/6 w/ C7 cord infact -not surgical or steroid candidate  Acute metabolic encephalopathy Possible anoxia Hx seizures, not on outpatient medications Hx alcohol abuse, intoxication MRI neg cerv fx, no pain on examination deep palpation  P:   Fentanyl PRN Thiamine / Folate -spine collar -  Appreciate Neuro assistance  -consider dc collar, will follow up with NS -PT when able -OT  Lavon Paganini. Titus Mould, MD, [place] Pgr: [facility]

## 2014-08-23 DIAGNOSIS — G8250 Quadriplegia, unspecified: Secondary | ICD-10-CM

## 2014-08-23 LAB — BASIC METABOLIC PANEL
ANION GAP: 9 (ref 5–15)
BUN: 24 mg/dL — AB (ref 6–23)
CHLORIDE: 110 meq/L (ref 96–112)
CO2: 30 mEq/L (ref 19–32)
Calcium: 8.4 mg/dL (ref 8.4–10.5)
Creatinine, Ser: 0.93 mg/dL (ref 0.50–1.35)
GFR calc non Af Amer: 88 mL/min — ABNORMAL LOW (ref >90)
Glucose, Bld: 129 mg/dL — ABNORMAL HIGH (ref 70–99)
POTASSIUM: 3.7 meq/L (ref 3.7–5.3)
SODIUM: 149 meq/L — AB (ref 137–147)

## 2014-08-23 LAB — GLUCOSE, CAPILLARY
GLUCOSE-CAPILLARY: 134 mg/dL — AB (ref 70–99)
GLUCOSE-CAPILLARY: 139 mg/dL — AB (ref 70–99)
GLUCOSE-CAPILLARY: 123 mg/dL — AB (ref 70–99)
Glucose-Capillary: 108 mg/dL — ABNORMAL HIGH (ref 70–99)
Glucose-Capillary: 127 mg/dL — ABNORMAL HIGH (ref 70–99)
Glucose-Capillary: 133 mg/dL — ABNORMAL HIGH (ref 70–99)
Glucose-Capillary: 135 mg/dL — ABNORMAL HIGH (ref 70–99)
Glucose-Capillary: 142 mg/dL — ABNORMAL HIGH (ref 70–99)

## 2014-08-23 LAB — PHOSPHORUS: PHOSPHORUS: 2.8 mg/dL (ref 2.3–4.6)

## 2014-08-23 LAB — CLOSTRIDIUM DIFFICILE BY PCR: Toxigenic C. Difficile by PCR: NEGATIVE

## 2014-08-23 LAB — MAGNESIUM: MAGNESIUM: 2.1 mg/dL (ref 1.5–2.5)

## 2014-08-23 LAB — VANCOMYCIN, TROUGH: Vancomycin Tr: 12.1 ug/mL (ref 10.0–20.0)

## 2014-08-23 MED ORDER — VANCOMYCIN HCL 10 G IV SOLR
1250.0000 mg | Freq: Three times a day (TID) | INTRAVENOUS | Status: DC
  Administered 2014-08-23 – 2014-08-26 (×10): 1250 mg via INTRAVENOUS
  Filled 2014-08-23 (×13): qty 1250

## 2014-08-23 MED ORDER — WHITE PETROLATUM GEL
Status: AC
Start: 2014-08-23 — End: 2014-08-24
  Filled 2014-08-23: qty 5

## 2014-08-23 MED ORDER — FREE WATER
200.0000 mL | Freq: Four times a day (QID) | Status: DC
  Administered 2014-08-23 – 2014-08-28 (×21): 200 mL

## 2014-08-23 MED ORDER — METOPROLOL TARTRATE 25 MG/10 ML ORAL SUSPENSION
25.0000 mg | Freq: Two times a day (BID) | ORAL | Status: DC
  Administered 2014-08-23 – 2014-08-28 (×11): 25 mg
  Filled 2014-08-23 (×17): qty 10

## 2014-08-23 MED ORDER — ASPIRIN 325 MG PO TABS
325.0000 mg | ORAL_TABLET | Freq: Every day | ORAL | Status: DC
  Administered 2014-08-23 – 2014-08-27 (×5): 325 mg via ORAL
  Filled 2014-08-23 (×5): qty 1

## 2014-08-23 NOTE — Consult Note (Signed)
Physical Medicine and Rehabilitation Consult Reason for Consult: Suspect spinal cord infarct Referring Physician: Critical care   HPI: Anthony Mcclure is a 63 y.o. right hand male with history of systolic congestive heart failure, alcohol abuse, hypertension, PAF and medical noncompliance. Patient reportedly independent prior to admission. Admitted 08/16/2014 after cardiac arrest with CPR initiated by EMS. Patient intubated underwent hyperthermia protocol. Cranial CT scan cervical C-spine negative. Alcohol level 284 on admission. Patient was intubated followed by critical care medicine. Echocardiogram with ejection fraction of 55% and normal systolic function. Upon rewarming patient noted to be quadriparetic. MRI cervical spine 08/19/2014 shows abnormal signal in the cervical cord extending from the craniocervical junction through the C3-4 level possibly from cord infarct versus edema. Hard cervical collar remained in place at all times. MRI of the brain completed 08/19/2014 that was negative except for global atrophy. EEG consistent with generalized nonspecific cerebral dysfunction and encephalopathy no seizure. Subcutaneous heparin for DVT prophylaxis. Patient currently maintained on vancomycin for pneumonia. Patient is n.p.o. with nasogastric tube for nutritional support. Positive MRSA nasal nares with contact precautions. Physical therapy continues to follow for noted debilitation and cognitive dysfunction. Recommendations made for physical medicine rehabilitation consult.  Patient thank in the whispers unable to comprehend Receiving oral care by RN, will stick out tongue to command Review of Systems  Unable to perform ROS: mental acuity   Past Medical History  Diagnosis Date  . CHF (congestive heart failure)   . Alcohol abuse   . Seizure   . Noncompliance with medication treatment due to intermittent use of medication 02/02/2012  . HTN (hypertension) 02/02/2012  . Alcohol withdrawal  seizure 02/02/2012  . Hypertension   . Dysrhythmia     paroxismal atrial fib  . Paroxysmal Atrial Flutter 02/01/2012  . Hepatitis C 02/02/2012    Unsure if he was ever diagnosed with Hep C  . Hepatitis C    Past Surgical History  Procedure Laterality Date  . Forearm reconstruction     Family History  Problem Relation Age of Onset  . Heart disease Father   . Alcohol abuse Father   . Alcohol abuse Brother   . Diabetes type II Mother    Social History:  reports that he has never smoked. He has never used smokeless tobacco. He reports that he drinks alcohol. He reports that he uses illicit drugs ("Crack" cocaine). Allergies: No Known Allergies Medications Prior to Admission  Medication Sig Dispense Refill  . metoprolol tartrate (LOPRESSOR) 25 MG tablet Take 25-50 mg by mouth 2 (two) times daily. 2 tablets (50 mg) at breakfast and 1 tablet (25 mg) at bedtime      . verapamil (CALAN) 120 MG tablet Take 1 tablet (120 mg total) by mouth 3 (three) times daily.  90 tablet  3  . meloxicam (MOBIC) 15 MG tablet Take 1 tablet (15 mg total) by mouth daily.  30 tablet  3    Home: Home Living Family/patient expects to be discharged to:: Unsure Additional Comments: Pt mouthing words.  but, unable to determine what he is trying to say.  Yes/no responses are inconsistent.  Per RN, pt was transient and moved from friend's home to friend's home and did not have a stable living environment   Functional History: Prior Function Level of Independence: Independent Comments: Pt is believed to be independent with at least BADLs and mobility.  Pt with h/o ETOH abuse Functional Status:  Mobility: Bed Mobility Overal bed mobility: Needs Assistance Bed  Mobility: Rolling;Sidelying to Sit;Sit to Supine Rolling: Total assist Sidelying to sit: Total assist;+2 for physical assistance;+2 for safety/equipment Sit to supine: Total assist;+2 for physical assistance;+2 for safety/equipment General bed mobility  comments: Pt with HOB 30 degrees on arrival without distress. Rolled pt and sat EOB with max assist for sitting balance with cues and total assist to look up. In sitting grossly 4 min EOB pt reported no dizziness but period of SVT with HR up to 200. Returned pt to supine with HR back to 100. During HEP pt with another burst of SVT to 200 for grossly 5 sec and resolved. Deferred lift OOB today due to SVT with RN aware.  Transfers General transfer comment: Pt unable       ADL: ADL Overall ADL's : Needs assistance/impaired Eating/Feeding: NPO Grooming: Wash/dry hands;Wash/dry face;Oral care;Brushing hair;Applying deodorant;Total assistance Upper Body Bathing: Total assistance Lower Body Bathing: Total assistance Upper Body Dressing : Total assistance Lower Body Dressing: Total assistance Toilet Transfer: Total assistance Toileting- Clothing Manipulation and Hygiene: Total assistance Tub/ Shower Transfer: Total assistance Functional mobility during ADLs: Total assistance General ADL Comments: Pt unable to assist with any aspects of BADLs.  Pt with 0/5 strength bil. UEs  Cognition: Cognition Overall Cognitive Status: Difficult to assess Orientation Level: Oriented to person Cognition Arousal/Alertness: Awake/alert Behavior During Therapy: WFL for tasks assessed/performed Overall Cognitive Status: Difficult to assess Difficult to assess due to: Impaired communication (pt with limited very soft verbalizations, able to state place and stating "yes maam" but not able to get much from pt)  Blood pressure 94/61, pulse 77, temperature 99.4 F (37.4 C), temperature source Oral, resp. rate 22, height 6' (1.829 m), weight 92 kg (202 lb 13.2 oz), SpO2 97.00%. Physical Exam  HENT:  Nasogastric tube in place  Eyes:  Pupils sluggish to light  Cardiovascular:  Cardiac rate control  Respiratory:  Decreased breath sounds at the bases  GI: Soft. Bowel sounds are normal. He exhibits no distension.    Neurological:  Patient is alert and makes contact with examiner but easily distracted. He was inconsistent the yes no head nods. He did not follow commands for me during exam.  Skin: Skin is warm and dry.   right IJ line without erythema or drainage Motor strength 0/5 bilateral deltoid, bicep, tricep, grip, hip flexor, knee extensors, ankle dorsiflexion plantar flexion Turns head side to side Extraocular movements are intact Tongue is midline Sensation does not withdraw to pinch in all 4 limbs Tone is diminished absent deep tendon reflexes  Results for orders placed during the hospital encounter of 08/16/14 (from the past 24 hour(s))  GLUCOSE, CAPILLARY     Status: Abnormal   Collection Time    08/22/14  4:27 PM      Result Value Ref Range   Glucose-Capillary 145 (*) 70 - 99 mg/dL  GLUCOSE, CAPILLARY     Status: Abnormal   Collection Time    08/22/14  8:28 PM      Result Value Ref Range   Glucose-Capillary 135 (*) 70 - 99 mg/dL   Comment 1 Notify RN    GLUCOSE, CAPILLARY     Status: Abnormal   Collection Time    08/22/14 11:58 PM      Result Value Ref Range   Glucose-Capillary 133 (*) 70 - 99 mg/dL   Comment 1 Notify RN    GLUCOSE, CAPILLARY     Status: Abnormal   Collection Time    08/23/14  2:08 AM  Result Value Ref Range   Glucose-Capillary 134 (*) 70 - 99 mg/dL  GLUCOSE, CAPILLARY     Status: Abnormal   Collection Time    08/23/14  4:02 AM      Result Value Ref Range   Glucose-Capillary 127 (*) 70 - 99 mg/dL   Comment 1 Notify RN    BASIC METABOLIC PANEL     Status: Abnormal   Collection Time    08/23/14  6:50 AM      Result Value Ref Range   Sodium 149 (*) 137 - 147 mEq/L   Potassium 3.7  3.7 - 5.3 mEq/L   Chloride 110  96 - 112 mEq/L   CO2 30  19 - 32 mEq/L   Glucose, Bld 129 (*) 70 - 99 mg/dL   BUN 24 (*) 6 - 23 mg/dL   Creatinine, Ser 0.93  0.50 - 1.35 mg/dL   Calcium 8.4  8.4 - 10.5 mg/dL   GFR calc non Af Amer 88 (*) >90 mL/min   GFR calc Af Amer  >90  >90 mL/min   Anion gap 9  5 - 15  PHOSPHORUS     Status: None   Collection Time    08/23/14  6:50 AM      Result Value Ref Range   Phosphorus 2.8  2.3 - 4.6 mg/dL  MAGNESIUM     Status: None   Collection Time    08/23/14  6:50 AM      Result Value Ref Range   Magnesium 2.1  1.5 - 2.5 mg/dL  GLUCOSE, CAPILLARY     Status: Abnormal   Collection Time    08/23/14  7:25 AM      Result Value Ref Range   Glucose-Capillary 139 (*) 70 - 99 mg/dL  VANCOMYCIN, TROUGH     Status: None   Collection Time    08/23/14  8:30 AM      Result Value Ref Range   Vancomycin Tr 12.1  10.0 - 20.0 ug/mL  GLUCOSE, CAPILLARY     Status: Abnormal   Collection Time    08/23/14 11:28 AM      Result Value Ref Range   Glucose-Capillary 135 (*) 70 - 99 mg/dL   Dg Chest Port 1 View  08/22/2014   CLINICAL DATA:  Shortness of breath.  Evaluate atelectasis  EXAM: PORTABLE CHEST - 1 VIEW  COMPARISON:  08/21/2014; 08/20/2014; 08/19/2014  FINDINGS: Grossly unchanged enlarged cardiac silhouette. Bilateral infrahilar heterogeneous opacities are unchanged. No new focal airspace opacities. No evidence of edema. No pleural effusion. A skin fold overlies the right upper lobe. No pleural effusion. Unchanged bones.  IMPRESSION: 1. Grossly unchanged bilateral infrahilar heterogeneous opacities, likely atelectasis. 2. A skin fold overlies the right upper lung. No definite pneumothorax. Further evaluation could be performed with PA and lateral chest radiograph as clinically indicated.   Electronically Signed   By: Sandi Mariscal M.D.   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 Does the need for close, 24 hr/day medical supervision in concert with the patient's rehab needs make it unreasonable for this patient to be served in a less intensive setting? No 2. Co-Morbidities requiring supervision/potential  complications: Alcohol abuse, atrial fibrillation with RVR hepatitis C 3. Due to bladder management, bowel management and skin/wound care, does the patient require 24 hr/day rehab nursing? Potentially 4. Does the patient require coordinated care of a physician,  rehab nurse, PT,OT,SLP to address physical and functional deficits in the context of the above medical diagnosis(es)? Potentially Addressing deficits in the following areas: NA 5. Can the patient actively participate in an intensive therapy program of at least 3 hrs of therapy per day at least 5 days per week? No 6. The potential for patient to make measurable gains while on inpatient rehab is poor 7. Anticipated functional outcomes upon discharge from inpatient rehab are n/a  with PT, n/a with OT, n/a with SLP. 8. Estimated rehab length of stay to reach the above functional goals is: NA 9. Does the patient have adequate social supports to accommodate these discharge functional goals? No 10. Anticipated D/C setting: Other 11. Anticipated post D/C treatments: SNF 12. Overall Rehab/Functional Prognosis: poor  RECOMMENDATIONS: This patient's condition is appropriate for continued rehabilitative care in the following setting: LTAC vs SNF if cardiac status stabilizes Patient has agreed to participate in recommended program. N/A Note that insurance prior authorization may be required for reimbursement for recommended care.  Comment: Unable to participate in  or benefit from a comprehensive intensive inpatient rehabilitation program at this point. Please reconsult if clinical status improved    08/23/2014

## 2014-08-23 NOTE — Clinical Social Work Note (Signed)
Clinical Social Work Department BRIEF PSYCHOSOCIAL ASSESSMENT 08/23/2014  Patient:  Anthony Mcclure, Anthony Mcclure     Account Number:  [id number]     Admit date:  08/16/2014  Clinical Social Worker:  Jennette Dubin  Date/Time:  08/23/2014 02:59 PM  Referred by:  Physician  Date Referred:  08/23/2014 Referred for  SNF Placement   Other Referral:   Interview type:  Other - See comment Other interview type:   Adult Protective Luvenia Heller [phone]    PSYCHOSOCIAL DATA Living Status:  ALONE Admitted from facility:   Level of care:   Primary support name:  Annitta Needs Primary support relationship to patient:  FAMILY Degree of support available:   Good support    CURRENT CONCERNS Current Concerns  Substance Abuse  Behavioral Health Issues   Other Concerns:    SOCIAL WORK ASSESSMENT / PLAN Pt admitted to hospital after having a witnessed heart attack while in the community. Pt has an Adult Protective Service worker-Elaine Wynetta Emery [phone] who acts as his representative payee. SW spoke with APS who reported that APS has been assisting  pt in bill payment and other social concerns. Pt was working with [employer] but discontinued service the beginning of the year. Pt has no children and only 1 brother, Donnie who currently lives in a SNF in [place]. APS reported that pt has inherited a large sum of money and many people claiming to be family has been coming to hospital trying to get pt.'s wallet and personal belongings. Pt has a cousin, Nile Dear who has been a contact for APS. [name] has set up a password in order for family to recieve information regarding pt. Pt will require rehabilitation once discharge from hospital. A referral has been made to CIR. SW will work pt up for SNF as a back up plan, this information was shared with APS as well as pt.'s cousin, [name].   Assessment/plan status:  Psychosocial Support/Ongoing Assessment of Needs Other  assessment/ plan:   N/A   Information/referral to community resources:    N/A    Charlene Brooke, MSW Clinical Social Worker [phone]

## 2014-08-23 NOTE — Progress Notes (Signed)
SLP Cancellation Note  Patient Details Name: Endre Coutts MRN: [id number] DOB: [birth year]   Cancelled treatment:        Attempted to see pt. This morning.  Pt. Working with PT.  Will continue tomorrow.   Houston Siren 08/23/2014, 3:03 PM Orbie Pyo Colvin Caroli.[employer] [phone]

## 2014-08-23 NOTE — Progress Notes (Signed)
PULMONARY / CRITICAL CARE MEDICINE  Name: Anthony Mcclure MRN: [id number] DOB: October 01, 1951    ADMISSION DATE:  08/16/2014 CONSULTATION DATE:  08/23/2014  REFERRING MD :  EDP  CHIEF COMPLAINT:  PEA Arrest  INITIAL PRESENTATION: 63 yo admitted 9/3 after cardiac arrest with total down time about 15 minutes before ROSC.  STUDIES / EVENTS:  9/3  Admitted with cardiac arrest, hypothermia started  9/3  CT Head / C-Spine >>> nad 9/4  TTE >>> EF 55% 9/5  Rewarming finished 9/5  EEG >>> nonspecific cerebral dsyfxn, no seizures  9/6  New finding of quadriparesis, MRI brain / C-spine ordered 9/7 MRI brain/C Spine: abn signal to cervical cord C3-4 ? Possible cord infarct or edema . Brain neg for acute process.  9/7 Passed SBT. Strong cough when suctioned. Extubated.   INTERVAL HISTORY:  Remains on RA  VITAL SIGNS: Temp:  [97.9 F (36.6 C)-100.5 F (38.1 C)] 100.2 F (37.9 C) (09/10 0725) Pulse Rate:  [61-91] 77 (09/10 0800) Resp:  [17-29] 23 (09/10 0800) BP: (96-142)/(59-95) 113/78 mmHg (09/10 0800) SpO2:  [91 %-96 %] 94 % (09/10 0800) Weight:  [92 kg (202 lb 13.2 oz)] 92 kg (202 lb 13.2 oz) (09/10 0406)  HEMODYNAMICS:    VENTILATOR SETTINGS:    INTAKE / OUTPUT: Intake/Output     09/09 0701 - 09/10 0700 09/10 0701 - 09/11 0700   NG/GT 1550 85   IV Piggyback 250    Total Intake(mL/kg) 1800 (19.6) 85 (0.9)   Urine (mL/kg/hr) 1975 (0.9) 415 (2.2)   Total Output 1975 415   Net -175 -330         PHYSICAL EXAMINATION: General: No distress Neuro: Alert, quadriparetic - moves BLEs weakly nonpurposeful HEENT: jvd noted Cardiovascular: RRR, no murmurs Lungs:CTA Abdomen: Soft abdomen, bs wnl Musculoskeletal: no edema  LABS:  CBC  Recent Labs Lab 08/19/14 0441 08/20/14 0541 08/21/14 0300  WBC 12.6* 12.9* 11.7*  HGB 13.8 12.8* 12.4*  HCT 40.8 39.3 37.3*  PLT 134* 121* 128*    Coag's  Recent Labs Lab 08/16/14 2250 08/17/14 0435 08/18/14 0930  APTT 32 37  --   INR  1.24 1.31 1.17   BMET  Recent Labs Lab 08/21/14 0300 08/22/14 0635 08/23/14 0650  NA 143 148* 149*  K 4.9 3.6* 3.7  CL 107 109 110  CO2 25 29 30   BUN 14 19 24*  CREATININE 0.77 0.81 0.93  GLUCOSE 93 133* 129*   Electrolytes  Recent Labs Lab 08/19/14 0441  08/21/14 0300 08/22/14 0635 08/23/14 0650  CALCIUM 7.5*  < > 8.1* 8.8 8.4  MG 1.5  < > 2.1 1.9 2.1  PHOS 3.1  --   --  3.3 2.8  < > = values in this interval not displayed. Sepsis Markers  Recent Labs Lab 08/16/14 2250 08/17/14 0335 08/17/14 1000  LATICACIDVEN 4.3* 2.1 1.7   ABG  Recent Labs Lab 08/16/14 2048 08/17/14 0059 08/18/14 0452  PHART 7.257* 7.369 7.457*  PCO2ART 35.0 24.2* 26.8*  PO2ART 98.0 156.0* 106.0*   Liver Enzymes  Recent Labs Lab 08/18/14 0419 08/19/14 0441 08/20/14 0541  AST 187* 120* 85*  ALT 135* 110* 84*  ALKPHOS 56 64 62  BILITOT 1.7* 1.1 1.2  ALBUMIN 2.7* 2.7* 2.7*   Cardiac Enzymes  Recent Labs Lab 08/16/14 2250 08/17/14 0330 08/17/14 0915  TROPONINI <0.30 <0.30 <0.30   Glucose  Recent Labs Lab 08/22/14 1627 08/22/14 2028 08/22/14 2358 08/23/14 0208 08/23/14 0402 08/23/14 0725  GLUCAP  145* 135* 133* 134* 127* 139*   IMAGING:  Dg Chest Port 1 View  08/22/2014   CLINICAL DATA:  Shortness of breath.  Evaluate atelectasis  EXAM: PORTABLE CHEST - 1 VIEW  COMPARISON:  08/21/2014; 08/20/2014; 08/19/2014  FINDINGS: Grossly unchanged enlarged cardiac silhouette. Bilateral infrahilar heterogeneous opacities are unchanged. No new focal airspace opacities. No evidence of edema. No pleural effusion. A skin fold overlies the right upper lobe. No pleural effusion. Unchanged bones.  IMPRESSION: 1. Grossly unchanged bilateral infrahilar heterogeneous opacities, likely atelectasis. 2. A skin fold overlies the right upper lung. No definite pneumothorax. Further evaluation could be performed with PA and lateral chest radiograph as clinically indicated.   Electronically Signed    By: Sandi Mariscal M.D.   On: 08/22/2014 07:47   Dg Abd Portable 1v  08/21/2014   CLINICAL DATA:  Feeding tube placement.  EXAM: PORTABLE ABDOMEN - 1 VIEW  COMPARISON:  Single view of the abdomen 08/19/2014.  FINDINGS: Feeding tube is in place with the tip projecting inferiorly along the midbody of the stomach.  IMPRESSION: As above.   Electronically Signed   By: Inge Rise M.D.   On: 08/21/2014 12:53   ASSESSMENT / PLAN:  PULMONARY A: Acute respiratory failure in the setting of PEA cardiac arrest, intubated 9/3 >> 9/07 Mild edema improved airway control concerns P:   Follow sats Suction as needed  CARDIOVASCULAR A:  PEA arrest, downtime 30 minutes before ROSC Hx PAF, HTN, chronic diastolic heart failure  pulm edema P:  Lopressor to oral, dc IV Hydralazine PRN, not required Tele  RENAL A:   AKI resolved Slight rise bun/crt on lasix P:   Trend BMP in am  Lasix hold  Have maximzied Free water addition  GASTROINTESTINAL A:   Nutrition GI Px dysphagia P:   TF with strict asp precuations Protonix slp repeat in future if improved, may need peg  HEMATOLOGIC A:   VTE Prophylaxis P:  Heparin sub q Int cbc  INFECTIOUS A:   Possible aspiration pneumonia 9/6 BC x 2 >> 9/6 Sputum >>MRSA P:   unasyn 9/6>>>9/9 vanc 9/7>>>consider 8 days, will add stop date  ENDOCRINE A:   Hypoglycemia-improving  P:   CBG  NEUROLOGIC A:   Quadriplegic -MRI 9/6 w/ C7 cord infact -not surgical or steroid candidate  Acute metabolic encephalopathy Possible anoxia Hx seizures, not on outpatient medications Hx alcohol abuse, intoxication MRI neg cerv fx, no pain on examination deep palpation  P:   Fentanyl PRN Thiamine / Folate -MRI no lig injury, no fx, no pain on palpation with reasonable examination - collar off -PT when able -OT  Global: await sdu, oral BB addition, dc lasix, to traid  [employer]. Titus Mould, MD, [place] Pgr: [facility]

## 2014-08-23 NOTE — Progress Notes (Signed)
Patient seen with pharmacy student. I agree with the above assessment and plan. Patient vancomycin trough below goal for indication. Will make small dose adjustment to 1250mg  IV q8 hour. Expected new trough of ~16. Will recheck at steady state tomorrow.  Erin Hearing PharmD., BCPS Clinical Pharmacist Pager [phone] 08/23/2014 2:23 PM

## 2014-08-23 NOTE — Progress Notes (Signed)
Patient admitted in from [facility] into room 3s01.  Vital signs WDL. Foley in place per MD order, placed 08/22/14 d/t Spinal cord injury and retention.  Patient oriented to room. Will continue to monitor

## 2014-08-23 NOTE — Progress Notes (Signed)
Physical Therapy Treatment Patient Details Name: Anthony Mcclure MRN: [id number] DOB: 02/10/1951 Today's Date: 08/23/2014    History of Present Illness 63 yo adm 08/16/14 s/p cardiac arrest with 15-30 minute prior to return of spontaneous circulation (ROSC). Pt intubated and underwent hypothermia protocol. 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 MRI c-spine showed abnormal signal in the cord from the craniocervical junction through C3-4 level. PMHx-CHF, hepatitis C, ETOH abuse, substance abuse, seizures, forearm reconstruction    PT Comments    Pt pleasant, smiling, tracking visually throughout session. Pt continues to have no AROM bil UE or bil LE and attempting limited verbalizations today although very soft spoken and have to have ear an inch from pt mouth to hear him. Pt reported no dizziness but did state he felt different during periods of SVT but not able to clearly state difference. Will continue to follow to progress mobility, activity and tolerance for upright. Pt without orthostatic hypotension with transfer to sitting.   Follow Up Recommendations  CIR;Supervision/Assistance - 24 hour     Equipment Recommendations       Recommendations for Other Services       Precautions / Restrictions Precautions Precautions: Cervical;Fall Precaution Comments: collar discontinued, watch HR periods of SVT Restrictions Weight Bearing Restrictions: No    Mobility  Bed Mobility Overal bed mobility: Needs Assistance Bed Mobility: Rolling;Sidelying to Sit;Sit to Supine Rolling: Total assist Sidelying to sit: Total assist;+2 for physical assistance;+2 for safety/equipment   Sit to supine: Total assist;+2 for physical assistance;+2 for safety/equipment   General bed mobility comments: Pt with HOB 30 degrees on arrival without distress. Rolled pt and sat EOB with max assist for sitting balance with cues and total assist to look up. In sitting grossly 4 min EOB pt reported no dizziness but  period of SVT with HR up to 200. Returned pt to supine with HR back to 100. During HEP pt with another burst of SVT to 200 for grossly 5 sec and resolved. Deferred lift OOB today due to SVT with RN aware.   Transfers                    Ambulation/Gait                 Stairs            Wheelchair Mobility    Modified Rankin (Stroke Patients Only)       Balance Overall balance assessment: Needs assistance Sitting-balance support: Feet unsupported;No upper extremity supported Sitting balance-Leahy Scale: Zero                              Cognition Arousal/Alertness: Awake/alert Behavior During Therapy: WFL for tasks assessed/performed Overall Cognitive Status: Difficult to assess                      Exercises General Exercises - Lower Extremity Ankle Circles/Pumps: PROM;Both;10 reps;Supine Hip ABduction/ADduction: PROM;Both;10 reps;Supine Hip Flexion/Marching: PROM;Both;10 reps;Supine    General Comments        Pertinent Vitals/Pain Pain Assessment: No/denies pain    Home Living                      Prior Function            PT Goals (current goals can now be found in the care plan section) Progress towards PT goals: Progressing toward goals    Frequency  PT Plan Current plan remains appropriate    Co-evaluation             End of Session   Activity Tolerance: Other (comment) (Treatment limited by SVT) Patient left: in bed (with HOB partial chair with HOB 35degrees)     Time: 8333-8329 PT Time Calculation (min): 24 min  Charges:  $Therapeutic Activity: 23-37 mins                    G Codes:      Melford Aase 2014-08-29, 11:18 AM Elwyn Reach, Rafael Capo

## 2014-08-23 NOTE — Progress Notes (Signed)
I contacted SW that pt is not a candidate for an inpt rehab stay at this time. I have contacted SW and she is aware we recommend SNF or LTACH. [phone]

## 2014-08-23 NOTE — Progress Notes (Signed)
ANTIBIOTIC CONSULT NOTE - FOLLOW UP  Pharmacy Consult for vancomycin Indication: pneumonia  No Known Allergies  Patient Measurements: Height: 6' (182.9 cm) Weight: 202 lb 13.2 oz (92 kg) IBW/kg (Calculated) : 77.6 Adjusted Body Weight: 85 kg  Vital Signs: Temp: 100.2 F (37.9 C) (09/10 0725) Temp src: Oral (09/10 0725) BP: 121/79 mmHg (09/10 0900) Pulse Rate: 79 (09/10 0900) Intake/Output from previous day: 09/09 0701 - 09/10 0700 In: 1800 [NG/GT:1550; IV Piggyback:250] Out: 1975 [WFUXN:2355] Intake/Output from this shift: Total I/O In: 150 [NG/GT:150] Out: 415 [Urine:415]  Labs:  Recent Labs  08/21/14 0300 08/22/14 0635 08/23/14 0650  WBC 11.7*  --   --   HGB 12.4*  --   --   PLT 128*  --   --   CREATININE 0.77 0.81 0.93   Estimated Creatinine Clearance: 89.2 ml/min (by C-G formula based on Cr of 0.93).  Recent Labs  08/23/14 0830  [place] 12.1     Microbiology: Recent Results (from the past 720 hour(s))  URINE CULTURE     Status: None   Collection Time    08/16/14  9:11 PM      Result Value Ref Range Status   Specimen Description URINE, CLEAN CATCH   Final   Special Requests none Normal   Final   Culture  Setup Time     Final   Value: 08/16/2014 21:58     Performed at [facility]     Final   Value: NO GROWTH     Performed at [facility]   Culture     Final   Value: NO GROWTH     Performed at [facility]   Report Status 08/18/2014 FINAL   Final  MRSA PCR SCREENING     Status: None   Collection Time    08/16/14 10:43 PM      Result Value Ref Range Status   MRSA by PCR NEGATIVE  NEGATIVE Final   Comment:            The GeneXpert MRSA Assay (FDA     approved for NASAL specimens     only), is one component of a     comprehensive MRSA colonization     surveillance program. It is not     intended to diagnose MRSA     infection nor to guide or     monitor treatment for     MRSA infections.   CULTURE, RESPIRATORY (NON-EXPECTORATED)     Status: None   Collection Time    08/19/14 11:15 AM      Result Value Ref Range Status   Specimen Description TRACHEAL ASPIRATE   Final   Special Requests NONE   Final   Gram Stain     Final   Value: ABUNDANT WBC PRESENT,BOTH PMN AND MONONUCLEAR     NO SQUAMOUS EPITHELIAL CELLS SEEN     MODERATE GRAM POSITIVE COCCI     IN CLUSTERS     Performed at [facility]   Culture     Final   Value: ABUNDANT METHICILLIN RESISTANT STAPHYLOCOCCUS AUREUS     Note: RIFAMPIN AND GENTAMICIN SHOULD NOT BE USED AS SINGLE DRUGS FOR TREATMENT OF STAPH INFECTIONS.     Performed at [facility]   Report Status 08/22/2014 FINAL   Final   Organism ID, Bacteria METHICILLIN RESISTANT STAPHYLOCOCCUS AUREUS   Final  CULTURE, BLOOD (ROUTINE X 2)     Status: None  Collection Time    08/19/14 11:25 AM      Result Value Ref Range Status   Specimen Description BLOOD LEFT ARM   Final   Special Requests BOTTLES DRAWN AEROBIC AND ANAEROBIC 5 CC   Final   Culture  Setup Time     Final   Value: 08/19/2014 19:17     Performed at [facility]   Culture     Final   Value:        BLOOD CULTURE RECEIVED NO GROWTH TO DATE CULTURE WILL BE HELD FOR 5 DAYS BEFORE ISSUING A FINAL NEGATIVE REPORT     Performed at [facility]   Report Status PENDING   Incomplete  CULTURE, BLOOD (ROUTINE X 2)     Status: None   Collection Time    08/19/14 11:30 AM      Result Value Ref Range Status   Specimen Description BLOOD LEFT ARM   Final   Special Requests BOTTLES DRAWN AEROBIC AND ANAEROBIC 5 CC    Final   Culture  Setup Time     Final   Value: 08/19/2014 19:17     Performed at [facility]   Culture     Final   Value:        BLOOD CULTURE RECEIVED NO GROWTH TO DATE CULTURE WILL BE HELD FOR 5 DAYS BEFORE ISSUING A FINAL NEGATIVE REPORT     Performed at [facility]   Report Status PENDING   Incomplete    Anti-infectives   Start      Dose/Rate Route Frequency Ordered Stop   08/23/14 1100  vancomycin (VANCOCIN) 1,250 mg in sodium chloride 0.9 % 250 mL IVPB     1,250 mg 166.7 mL/hr over 90 Minutes Intravenous Every 8 hours 08/23/14 1036 08/28/14 1859   08/22/14 0030  vancomycin (VANCOCIN) IVPB 1000 mg/200 mL premix  Status:  Discontinued     1,000 mg 200 mL/hr over 60 Minutes Intravenous Every 8 hours 08/21/14 1559 08/23/14 1036   08/21/14 1630  vancomycin (VANCOCIN) 1,750 mg in sodium chloride 0.9 % 500 mL IVPB     1,750 mg 250 mL/hr over 120 Minutes Intravenous  Once 08/21/14 1559 08/21/14 1838   08/19/14 1200  Ampicillin-Sulbactam (UNASYN) 3 g in sodium chloride 0.9 % 100 mL IVPB  Status:  Discontinued     3 g 100 mL/hr over 60 Minutes Intravenous Every 6 hours 08/19/14 1140 08/22/14 1027      Assessment: 63 yo m admitted on 9/3 s/p PEA arrest outside of hospital with no CPR given for around 10-15 minutes. Patient is now s/p hypothermia protocol. Unasyn was started on 9/6 for concerns of aspiration pneumonia. Respiratory culture revealed abundant staphylococcus aureus. Pharmacy is consulted to dose vancomycin for pneumonia. Wbc 11.7, tmax in last 24hrs 100.5, SCr 0.93, CrCl 89 ml/min, weight 92 kg. Will increase vanc dose to 1,250 mg IV q8hr.   Goal of Therapy:  Vancomycin trough level 15-20 mcg/ml  Plan:  Increase vancomycin to 1250 mg IV q8h.  Reassess vanc trough tomorrow (9/11) at 1030 (before 1100 dose) Monitor wbc, temp curve, clinical course  Anthony Mcclure 08/23/2014,10:40 AM

## 2014-08-24 DIAGNOSIS — G9511 Acute infarction of spinal cord (embolic) (nonembolic): Secondary | ICD-10-CM | POA: Diagnosis present

## 2014-08-24 DIAGNOSIS — G9519 Other vascular myelopathies: Secondary | ICD-10-CM

## 2014-08-24 DIAGNOSIS — E870 Hyperosmolality and hypernatremia: Secondary | ICD-10-CM

## 2014-08-24 DIAGNOSIS — E46 Unspecified protein-calorie malnutrition: Secondary | ICD-10-CM | POA: Diagnosis present

## 2014-08-24 DIAGNOSIS — R1310 Dysphagia, unspecified: Secondary | ICD-10-CM | POA: Diagnosis present

## 2014-08-24 LAB — GLUCOSE, CAPILLARY
GLUCOSE-CAPILLARY: 126 mg/dL — AB (ref 70–99)
GLUCOSE-CAPILLARY: 127 mg/dL — AB (ref 70–99)
Glucose-Capillary: 118 mg/dL — ABNORMAL HIGH (ref 70–99)
Glucose-Capillary: 108 mg/dL — ABNORMAL HIGH (ref 70–99)
Glucose-Capillary: 112 mg/dL — ABNORMAL HIGH (ref 70–99)

## 2014-08-24 LAB — BASIC METABOLIC PANEL
Anion gap: 10 (ref 5–15)
BUN: 24 mg/dL — AB (ref 6–23)
CALCIUM: 8.4 mg/dL (ref 8.4–10.5)
CO2: 29 meq/L (ref 19–32)
Chloride: 112 mEq/L (ref 96–112)
Creatinine, Ser: 0.84 mg/dL (ref 0.50–1.35)
GFR calc Af Amer: >90 mL/min (ref >90)
GFR calc non Af Amer: >90 mL/min (ref >90)
GLUCOSE: 124 mg/dL — AB (ref 70–99)
Potassium: 3.7 mEq/L (ref 3.7–5.3)
SODIUM: 151 meq/L — AB (ref 137–147)

## 2014-08-24 LAB — VANCOMYCIN, TROUGH: Vancomycin Tr: 12.8 ug/mL (ref 10.0–20.0)

## 2014-08-24 MED ORDER — DEXTROSE 5 % IV SOLN
INTRAVENOUS | Status: DC
  Administered 2014-08-24: 13:00:00 via INTRAVENOUS

## 2014-08-24 NOTE — Progress Notes (Signed)
Speech Language Pathology Treatment: Dysphagia  Patient Details Name: Anthony Mcclure MRN: [id number] DOB: 07-26-1951 Today's Date: 08/24/2014 Time: 6967-8938 SLP Time Calculation (min): 14 min  Assessment / Plan / Recommendation Clinical Impression  SLP provided assistance in removal of thick oropharyngeal secretions followed by more aggressive PO trials to determine readiness for objective assessment. Though pt continues to demonstrate concerning signs of a severe oropharyngeal dysphagia, recommend proceeding with FEES to assist with plan for nutrition. MD in agreement. Will complete assessment tomorrow.    HPI HPI: [name] brought to ED on 9/3 after he suffered an out of hospital cardiac arrest. No CPR was performed until 10-15 minutes later when EMS arrived. In ED, pt was comatose and was intubated for respiratory failure, hypothermia protocol initiated. Following intubation, he became hypotensive. MRI brain/C Spine: abn signal to cervical cord C3-4 ? Possible cord infarct or edema . Brain neg for acute process.  EEG is consistent with a generalized nonspecific cerebral dysfunction (encephalopathy). Pt intubated from 9/3-9/7.    Pertinent Vitals Pain Assessment: Faces Faces Pain Scale: Hurts a little bit (with positioning)  SLP Plan  Other (Comment) (FEES)    Recommendations Diet recommendations: NPO Medication Administration: Via alternative means              Oral Care Recommendations: Oral care Q4 per protocol Follow up Recommendations: Skilled Nursing facility Plan: Other (Comment) (FEES)    GO    Herbie [place], MA CCC-SLP [phone]  Lynann Beaver 08/24/2014, 11:02 AM

## 2014-08-24 NOTE — Progress Notes (Addendum)
ANTIBIOTIC CONSULT NOTE - FOLLOW UP  Pharmacy Consult for vancomycin Indication: pneumonia  No Known Allergies  Patient Measurements: Height: 6' (182.9 cm) Weight: 186 lb 8.2 oz (84.6 kg) IBW/kg (Calculated) : 77.6 Adjusted Body Weight: 85 kg  Vital Signs: Temp: 100.4 F (38 C) (09/11 1235) Temp src: Axillary (09/11 1235) BP: 137/69 mmHg (09/11 0749) Pulse Rate: 81 (09/11 0749) Intake/Output from previous day: 09/10 0701 - 09/11 0700 In: 2985 [NG/GT:2235; IV Piggyback:750] Out: 1440 [Urine:1440] Intake/Output from this shift: Total I/O In: -  Out: 325 [Urine:325]  Labs:  Recent Labs  08/22/14 0635 08/23/14 0650 08/24/14 0312  CREATININE 0.81 0.93 0.84   Estimated Creatinine Clearance: 98.8 ml/min (by C-G formula based on Cr of 0.84).  Recent Labs  08/23/14 0830 08/24/14 1155  [place] 12.1 12.8     Microbiology: Recent Results (from the past 720 hour(s))  URINE CULTURE     Status: None   Collection Time    08/16/14  9:11 PM      Result Value Ref Range Status   Specimen Description URINE, CLEAN CATCH   Final   Special Requests none Normal   Final   Culture  Setup Time     Final   Value: 08/16/2014 21:58     Performed at [facility]     Final   Value: NO GROWTH     Performed at [facility]   Culture     Final   Value: NO GROWTH     Performed at [facility]   Report Status 08/18/2014 FINAL   Final  MRSA PCR SCREENING     Status: None   Collection Time    08/16/14 10:43 PM      Result Value Ref Range Status   MRSA by PCR NEGATIVE  NEGATIVE Final   Comment:            The GeneXpert MRSA Assay (FDA     approved for NASAL specimens     only), is one component of a     comprehensive MRSA colonization     surveillance program. It is not     intended to diagnose MRSA     infection nor to guide or     monitor treatment for     MRSA infections.  CULTURE, RESPIRATORY (NON-EXPECTORATED)     Status: None   Collection Time    08/19/14 11:15 AM      Result Value Ref Range Status   Specimen Description TRACHEAL ASPIRATE   Final   Special Requests NONE   Final   Gram Stain     Final   Value: ABUNDANT WBC PRESENT,BOTH PMN AND MONONUCLEAR     NO SQUAMOUS EPITHELIAL CELLS SEEN     MODERATE GRAM POSITIVE COCCI     IN CLUSTERS     Performed at [facility]   Culture     Final   Value: ABUNDANT METHICILLIN RESISTANT STAPHYLOCOCCUS AUREUS     Note: RIFAMPIN AND GENTAMICIN SHOULD NOT BE USED AS SINGLE DRUGS FOR TREATMENT OF STAPH INFECTIONS.     Performed at [facility]   Report Status 08/22/2014 FINAL   Final   Organism ID, Bacteria METHICILLIN RESISTANT STAPHYLOCOCCUS AUREUS   Final  CULTURE, BLOOD (ROUTINE X 2)     Status: None   Collection Time    08/19/14 11:25 AM      Result Value Ref Range Status   Specimen Description BLOOD  LEFT ARM   Final   Special Requests BOTTLES DRAWN AEROBIC AND ANAEROBIC 5 CC   Final   Culture  Setup Time     Final   Value: 08/19/2014 19:17     Performed at [facility]   Culture     Final   Value:        BLOOD CULTURE RECEIVED NO GROWTH TO DATE CULTURE WILL BE HELD FOR 5 DAYS BEFORE ISSUING A FINAL NEGATIVE REPORT     Performed at [facility]   Report Status PENDING   Incomplete  CULTURE, BLOOD (ROUTINE X 2)     Status: None   Collection Time    08/19/14 11:30 AM      Result Value Ref Range Status   Specimen Description BLOOD LEFT ARM   Final   Special Requests BOTTLES DRAWN AEROBIC AND ANAEROBIC 5 CC    Final   Culture  Setup Time     Final   Value: 08/19/2014 19:17     Performed at [facility]   Culture     Final   Value:        BLOOD CULTURE RECEIVED NO GROWTH TO DATE CULTURE WILL BE HELD FOR 5 DAYS BEFORE ISSUING A FINAL NEGATIVE REPORT     Performed at [facility]   Report Status PENDING   Incomplete  CLOSTRIDIUM DIFFICILE BY PCR     Status: None   Collection Time    08/23/14  1:30 PM      Result  Value Ref Range Status   C difficile by pcr NEGATIVE  NEGATIVE Final     Assessment: 62 yo m admitted on 9/3 s/p PEA arrest outside of hospital with no CPR given for around 10-15 minutes. Patient is now s/p hypothermia protocol. Unasyn was started on 9/6 for concerns of aspiration pneumonia. Respiratory culture revealed abundant staphylococcus aureus. Pharmacy consulted to dose vancomycin for pneumonia, day # 4/9 per CCM plans. Wbc 11.7 on 9/8 tmax in last 24hrs 10045, SCr 0.93, CrCl 89 ml/min, weight 92 kg. Today vanc trough is 12.8 after 3 doses of vanc 1250 IV q8h. This is less that goal but acceptable.  Expect vanc to accumulate after several doses.  9/10 Vanc trough 12 on 1000 mg IV q8h, dose increase to 1250 q12 9/11 vanc trough 12.8 after 3 doses of vanc 1250 mg IV q8h,  9/10 c diff neg 9/6 TA MRSA  9/6 BCx2 ngtd Goal of Therapy:  Vancomycin trough level 15-20 mcg/ml  Plan:  continue vancomycin to 1250 mg IV q8h to end on 9/15 to complete 8 day course  Monitor wbc, temp curve, clinical course  Eudelia Bunch, Pharm.D. [phone] 08/24/2014 2:02 PM

## 2014-08-24 NOTE — Clinical Social Work Placement (Addendum)
Clinical Social Work Department CLINICAL SOCIAL WORK PLACEMENT NOTE 08/24/2014  Patient:  Anthony Mcclure, Anthony Mcclure  Account Number:  [id number] Admit date:  08/16/2014  Clinical Social Worker:  Greta Doom, LCSWA  Date/time:  08/24/2014 03:14 PM  Clinical Social Work is seeking post-discharge placement for this patient at the following level of care:   SKILLED NURSING   (*CSW will update this form in Epic as items are completed)   08/23/2014  Patient/family provided with [facility] Department of Clinical Social Work's list of facilities offering this level of care within the geographic area requested by the patient (or if unable, by the patient's family).  08/23/2014  Patient/family informed of their freedom to choose among providers that offer the needed level of care, that participate in Medicare, Medicaid or managed care program needed by the patient, have an available bed and are willing to accept the patient.  08/23/2014  Patient/family informed of [facility]' ownership interest in [facility], as well as of the fact that they are under no obligation to receive care at this facility.  PASARR submitted to EDS on 08/23/2014 PASARR number received on 08/23/2014  FL2 transmitted to all facilities in geographic area requested by pt/family on  08/23/2014 FL2 transmitted to all facilities within larger geographic area on 08/23/2014  Patient informed that his/her managed care company has contracts with or will negotiate with  certain facilities, including the following:     Patient/family informed of bed offers received:  08/24/2014 Patient chooses bed at [facility] Physician recommends and patient chooses bed at    Patient to be transferred to [facility] on  08/31/2014 Patient to be transferred to facility by [facility] Patient and family notified of transfer on 08/31/2014 Name of family member notified: [name] [phone]     The following physician request were  entered in Epic:   Additional Comments:  [place], MSW, [place]

## 2014-08-24 NOTE — Consult Note (Signed)
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 He is non verbal, +dysphagia has PANDA tube for feeding,opens eyes follows a few simple commands. He has [employer] who is his Payee for his SSDI. Difficult situation with family. Has a brother in a SNF in [place] and a cousin [name] who has been a reliable family contact. He just inhertited a significant amount of money, a house and three cars from a relative  So friends claiming to be family have been coming to the hospital. He has no HCPOA -no POA.   His prognosis is very poor-he needs a full goals of care discussion- his life as a quadriplegic will be very difficult and limited at best he will need total care in a SNF.   1. We need to pursue Guardianship ASAP- need CSW to assist with this-he does not have capacity and is incompetent to make his own decisions- we need an intermediate guardian appointed ASAP. Will also ask [name] to participate in that process.  2. Meeting planned for Monday at [facility] with CSW and [name] to determine next steps.   Will need to address PEG tube and disposition plans which will require medical goals and decision making.  Lane Hacker, DO Palliative Medicine

## 2014-08-24 NOTE — Progress Notes (Signed)
Physical Therapy Treatment Patient Details Name: Anthony Mcclure MRN: [id number] DOB: 03/16/1951 Today's Date: 08/24/2014    History of Present Illness 63 yo adm 08/16/14 s/p cardiac arrest with 15-30 minute prior to return of spontaneous circulation (ROSC). Pt intubated and underwent hypothermia protocol. 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 MRI c-spine showed abnormal signal in the cord from the craniocervical junction through C3-4 level. PMHx-CHF, hepatitis C, ETOH abuse, substance abuse, seizures, forearm reconstruction    PT Comments    Patient demonstrates very minimal active neck ROM, no other active movement noted. BLEs now flaccid in appearance with no evidence of functional use. Performed some PROM and assisted patient OOB to chair with maxi move lift equipment. Patient completely dependent, several pillows used to position and maintain trunk in chair. Recommended air-overlay mattress for patient as patient shows no sign of active return at this time. OF NOTE: during use of lift equipment, patient did have episode of extended SVT 186 HR consistently elevated for several moments prior to decreasing. Nsg aware. Will trial additional session of PT next wk, however, at this time patient presenting with all extremities of flaccid movement and may not remain appropriate for participation in acute physical therapy.   Follow Up Recommendations  LTACH     Equipment Recommendations   (TBD if no CIR)    Recommendations for Other Services Rehab consult     Precautions / Restrictions Precautions Precautions: Fall Cervical Brace: Hard collar;At all times Restrictions Weight Bearing Restrictions: No    Mobility  Bed Mobility Overal bed mobility: Needs Assistance Bed Mobility: Rolling;Sidelying to Sit;Sit to Supine Rolling: Total assist Sidelying to sit: Total assist;+2 for physical assistance;+2 for safety/equipment   Sit to supine: Total assist;+2 for physical assistance;+2  for safety/equipment   General bed mobility comments: Patient demonstrates some active neck mobility and repositioing, but no other active movement noted  Transfers Overall transfer level: Needs assistance               General transfer comment: Used Maxi-move for OOB to chair  Ambulation/Gait                 Stairs            Wheelchair Mobility    Modified Rankin (Stroke Patients Only)       Balance     Sitting balance-Leahy Scale: Zero                              Cognition Arousal/Alertness: Awake/alert Behavior During Therapy: WFL for tasks assessed/performed Overall Cognitive Status: Difficult to assess                      Exercises General Exercises - Lower Extremity Ankle Circles/Pumps: PROM;Both;10 reps;Supine Hip ABduction/ADduction: PROM;Both;10 reps;Supine Hip Flexion/Marching: PROM;Both;10 reps;Supine Other Exercises Other Exercises: PROM all extremities, planes of motion    General Comments        Pertinent Vitals/Pain Pain Assessment: Faces Faces Pain Scale: Hurts a little bit (with positioning)    Home Living                      Prior Function            PT Goals (current goals can now be found in the care plan section) Acute Rehab PT Goals Patient Stated Goal: uanble to state/communicate PT Goal Formulation: Patient unable to participate in goal setting  Time For Goal Achievement: 09/04/14 Potential to Achieve Goals: Good Progress towards PT goals: Not progressing toward goals - comment    Frequency  Min 3X/week    PT Plan Current plan remains appropriate    Co-evaluation             End of Session Equipment Utilized During Treatment: Cervical collar Activity Tolerance: Other (comment) (Treatment limited by SVT, HR elevated to 186) Patient left: in chair     Time: 1550-1636 PT Time Calculation (min): 46 min  Charges:  $Therapeutic Activity: 38-52 mins                     G CodesDuncan Dull 02-Sep-2014, 5:44 PM Alben Deeds, [place] DPT  [phone]

## 2014-08-24 NOTE — Progress Notes (Signed)
[name]  Anthony Mcclure OMV:[id number] DOB: 04/29/1951 DOA: 08/16/2014 PCP: [name], MD   Brief narrative: [name] brought to ED on 9/3 after he suffered an out of hospital cardiac arrest. Arrest was witnessed; however, no CPR was performed until 10-15 minutes later when EMS arrived. Upon their arrival, rhythm was noted to be PEA. ACLS was performed for 15 minutes before ROSC. During resuscitation, pt received 2 of epi and 1amp D50. In ED, pt was comatose and was intubated for respiratory failure. PCCM was consulted for admission and initiation of hypothermia protocol.  Postarrest EKG and enzymes were negative and not consistent with ischemic etiology to the arrest. Patient eventually stabilized and hypothermia was reversed and patient was able to be extubated. After regaining consciousness patient was unable to move his extremities. MRI of his brain was unremarkable no acute normality. MRI of the cervical spine showed abnormal increased T2 signal in the cervical cord extending from the craniocervical junction through the C3/4 level possibly from cord infarction/cord edema or chronic myelomalacia. No associated cord enhancement to suggest high-grade tumor. Neurology was consulted. Patient underwent EEG on 9/5 that demonstrated generalized slowing consistent with encephalopathic state but otherwise was unremarkable.  Unfortunately patient's symptomatology has not improved. The neurologist but with Dr. Vertell Limber in neurosurgery who reviewed the images and stated he did not see any active compression and therefore did not feel there was any role for acute surgery or steroids. Neurology was also concerned about his mental status and documented there may be some sort of hypoxic brain injury there as well. Echo showed no area of clear thrombosis and has been postulated that possibly cervical stenosis cause compromise of the vessel as well as abnormal wall  movement on rest could lead to thrombus formation with a subsequent embolic event although he felt this was less likely.  Patient has been evaluated by OT, PT and speech therapy. He currently is at high-risk for oral diet so is n.p.o. and has a feeding tube in place. Speech therapy plans fees study. He was evaluated by CIR but was deemed not an appropriate candidate for inpatient stay at this time. Recommendation is to either pursue skilled nursing facility or LTAC. [facility] has and care of this patient as of 08/24/14.  STUDIES / EVENTS:  9/3 Admitted with cardiac arrest, hypothermia started  9/3 CT Head / C-Spine >>> nad  9/4 TTE >>> EF 55%  9/5 Rewarming finished  9/5 EEG >>> nonspecific cerebral dsyfxn, no seizures  9/6 New finding of quadriparesis, MRI brain / C-spine ordered  9/7 MRI brain/C Spine: abn signal to cervical cord C3-4 ? Possible cord infarct or edema . Brain neg for acute process.  9/7 Passed SBT. Strong cough when suctioned. Extubated.    HPI/Subjective: Awakens and appears to be able to follow some minimal commands. Speech is very soft and hoarse but he seems to be somewhat confused but this is difficult to ascertain properly. When testing sensory perception patient not if he could feel me touching his abdomen but not his extremities and he appeared to be with going his toes when asked to wiggle his hands.  Assessment/Plan: Active Problems:   Cardiac arrest/PEA  -Based on cardiac enzymes and EKG does not appear to be ischemic in etiology-unclear what precipitated the initial event    Acute respiratory failure with hypoxia/post arrest pulmonary edema/MRSA pneumonia -Currently symptoms stable but given dysphagia at risk for aspiration at  this point-continue vancomycin    Dysphagia -In setting of new diagnosis quadriplegia as well as possible hypoxemic brain injury-ST following and fees study pending-continue tube feedings-suspect over the [place]-term may require  PEG tube but given neurological injury suspect may not be able to return to oral diet    Paroxysmal Atrial Flutter -Currently maintaining sinus rhythm    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 -Neurology following-quadriplegia symptoms have not significantly improved-continue PT/OT-may need to consider LTAC placement soon-please see Palliative Care note for today    Dehydration with hypernatremia -Continue free water per tube-add dextrose infusion at 50 cc per hour    Alcohol abuse/ Hepatitis C    HTN  -Currently blood pressures controlled on Lopressor    Protein calorie malnutrition -Nutritional needs currently met by tube feedings   DVT prophylaxis: Subcutaneous heparin Code Status: Full Family Communication: No family at bedside Disposition Plan/Expected LOS: Stepdown   Consultants: Neurology PCCM  Cultures: 9/6 tracheal aspirate positive for MRSA 9/6 blood cultures x2 negative 9/10 C. difficile PCR negative 9/11 urine culture pending  Antibiotics: Unasyn 9/6 >>> 9/9 Vancomycin 9/8 >>>  Objective: Blood pressure 137/69, pulse 81, temperature 100.4 F (38 C), temperature source Axillary, resp. rate 24, height 6' (1.829 m), weight 186 lb 8.2 oz (84.6 kg), SpO2 97.00%.  Intake/Output Summary (Last 24 hours) at 08/24/14 1522 Last data filed at 08/24/14 1100  Gross per 24 hour  Intake   1775 ml  Output   1050 ml  Net    725 ml     Exam: Gen: No acute respiratory distress Neuro: Patient awakens but does not appear to be only oriented, unable to move arms or legs although may be able to very slightly wiggle toes, does not appear to have sensation in extremities but endorses sensation above the nipple line Chest: Coarse to auscultation bilaterally without wheezes, rhonchi or crackles, room air-phonation very weak Cardiac: Regular rate and rhythm, S1-S2, no rubs murmurs or gallops, no peripheral edema, no JVD Abdomen: Soft nontender nondistended without  obvious hepatosplenomegaly, no ascites-PANDA tube in place for feedings Extremities: Symmetrical in appearance without cyanosis, clubbing or effusion  Scheduled Meds:  Scheduled Meds: . antiseptic oral rinse  7 mL Mouth Rinse q12n4p  . aspirin  325 mg Oral Daily  . chlorhexidine  15 mL Mouth Rinse BID  . folic acid  1 mg Intravenous Daily  . free water  200 mL Per Tube Q6H  . heparin  5,000 Units Subcutaneous 3 times per day  . metoprolol tartrate  25 mg Per Tube BID  . pantoprazole (PROTONIX) IV  40 mg Intravenous Daily  . thiamine  100 mg Intravenous Daily  . vancomycin  1,250 mg Intravenous Q8H   Continuous Infusions: . dextrose 50 mL/hr at 08/24/14 1254  . feeding supplement (VITAL AF 1.2 CAL) 1,000 mL (08/24/14 0340)    Data Reviewed: Basic Metabolic Panel:  Recent Labs Lab 08/19/14 0441 08/20/14 0541 08/21/14 0300 08/22/14 0635 08/23/14 0650 08/24/14 0312  NA 141 145 143 148* 149* 151*  K 4.2 3.7 4.9 3.6* 3.7 3.7  CL 108 109 107 109 110 112  CO2 _0 GLUCOSE 153* 120* 93 133* 129* 124*  BUN _1 24* 24*  CREATININE 0.92 0.84 0.77 0.81 0.93 0.84  CALCIUM 7.5* 7.9* 8.1* 8.8 8.4 8.4  MG 1.5 1.9 2.1 1.9 2.1  --   PHOS 3.1  --   --  3.3 2.8  --  Liver Function Tests:  Recent Labs Lab 08/18/14 0419 08/19/14 0441 08/20/14 0541  AST 187* 120* 85*  ALT 135* 110* 84*  ALKPHOS 56 64 62  BILITOT 1.7* 1.1 1.2  PROT 6.0 6.4 6.4  ALBUMIN 2.7* 2.7* 2.7*   No results found for this basename: LIPASE, AMYLASE,  in the last 168 hours No results found for this basename: AMMONIA,  in the last 168 hours CBC:  Recent Labs Lab 08/18/14 0419 08/19/14 0441 08/20/14 0541 08/21/14 0300  WBC 7.4 12.6* 12.9* 11.7*  NEUTROABS 6.3  --   --   --   HGB 14.3 13.8 12.8* 12.4*  HCT 40.9 40.8 39.3 37.3*  MCV 79.6 81.6 83.8 82.9  PLT 144* 134* 121* 128*   Cardiac Enzymes: No results found for this basename: CKTOTAL, CKMB, CKMBINDEX, TROPONINI,  in the  last 168 hours BNP (last 3 results)  Recent Labs  02/14/14 1145  PROBNP 1322.0*   CBG:  Recent Labs Lab 08/23/14 2310 08/24/14 0307 08/24/14 0746 08/24/14 1226 08/24/14 1507  GLUCAP 108* 126* 118* 108* 112*    Recent Results (from the past 240 hour(s))  URINE CULTURE     Status: None   Collection Time    08/16/14  9:11 PM      Result Value Ref Range Status   Specimen Description URINE, CLEAN CATCH   Final   Special Requests none Normal   Final   Culture  Setup Time     Final   Value: 08/16/2014 21:58     Performed at [facility]     Final   Value: NO GROWTH     Performed at [facility]   Culture     Final   Value: NO GROWTH     Performed at [facility]   Report Status 08/18/2014 FINAL   Final  MRSA PCR SCREENING     Status: None   Collection Time    08/16/14 10:43 PM      Result Value Ref Range Status   MRSA by PCR NEGATIVE  NEGATIVE Final   Comment:            The GeneXpert MRSA Assay (FDA     approved for NASAL specimens     only), is one component of a     comprehensive MRSA colonization     surveillance program. It is not     intended to diagnose MRSA     infection nor to guide or     monitor treatment for     MRSA infections.  CULTURE, RESPIRATORY (NON-EXPECTORATED)     Status: None   Collection Time    08/19/14 11:15 AM      Result Value Ref Range Status   Specimen Description TRACHEAL ASPIRATE   Final   Special Requests NONE   Final   Gram Stain     Final   Value: ABUNDANT WBC PRESENT,BOTH PMN AND MONONUCLEAR     NO SQUAMOUS EPITHELIAL CELLS SEEN     MODERATE GRAM POSITIVE COCCI     IN CLUSTERS     Performed at [facility]   Culture     Final   Value: ABUNDANT METHICILLIN RESISTANT STAPHYLOCOCCUS AUREUS     Note: RIFAMPIN AND GENTAMICIN SHOULD NOT BE USED AS SINGLE DRUGS FOR TREATMENT OF STAPH INFECTIONS.     Performed at [facility]   Report Status 08/22/2014 FINAL   Final    Organism  ID, Bacteria METHICILLIN RESISTANT STAPHYLOCOCCUS AUREUS   Final  CULTURE, BLOOD (ROUTINE X 2)     Status: None   Collection Time    08/19/14 11:25 AM      Result Value Ref Range Status   Specimen Description BLOOD LEFT ARM   Final   Special Requests BOTTLES DRAWN AEROBIC AND ANAEROBIC 5 CC   Final   Culture  Setup Time     Final   Value: 08/19/2014 19:17     Performed at [facility]   Culture     Final   Value:        BLOOD CULTURE RECEIVED NO GROWTH TO DATE CULTURE WILL BE HELD FOR 5 DAYS BEFORE ISSUING A FINAL NEGATIVE REPORT     Performed at [facility]   Report Status PENDING   Incomplete  CULTURE, BLOOD (ROUTINE X 2)     Status: None   Collection Time    08/19/14 11:30 AM      Result Value Ref Range Status   Specimen Description BLOOD LEFT ARM   Final   Special Requests BOTTLES DRAWN AEROBIC AND ANAEROBIC 5 CC    Final   Culture  Setup Time     Final   Value: 08/19/2014 19:17     Performed at [facility]   Culture     Final   Value:        BLOOD CULTURE RECEIVED NO GROWTH TO DATE CULTURE WILL BE HELD FOR 5 DAYS BEFORE ISSUING A FINAL NEGATIVE REPORT     Performed at [facility]   Report Status PENDING   Incomplete  CLOSTRIDIUM DIFFICILE BY PCR     Status: None   Collection Time    08/23/14  1:30 PM      Result Value Ref Range Status   C difficile by pcr NEGATIVE  NEGATIVE Final     Studies:  Recent x-ray studies have been reviewed in detail by the Attending Physician  Time spent :      Erin Hearing, ANP [facility] Office  [phone] Pager [phone]   **If unable to reach the above provider after paging please contact the [place] [facility] @ [phone]  If 7PM-7AM, please contact night-coverage [email or web address] Password [email or web address] 08/24/2014, 3:22 PM   LOS: 8 days   Attending note:  Chart reviewed. Patient examined independently. Guardianship being established. PMT involved. Note amended.  Doree Barthel,  MD [facility]

## 2014-08-25 ENCOUNTER — Inpatient Hospital Stay (HOSPITAL_COMMUNITY): Payer: Medicare Other

## 2014-08-25 LAB — GLUCOSE, CAPILLARY
GLUCOSE-CAPILLARY: 121 mg/dL — AB (ref 70–99)
GLUCOSE-CAPILLARY: 120 mg/dL — AB (ref 70–99)
GLUCOSE-CAPILLARY: 112 mg/dL — AB (ref 70–99)
Glucose-Capillary: 120 mg/dL — ABNORMAL HIGH (ref 70–99)

## 2014-08-25 LAB — CBC
HCT: 38.7 % — ABNORMAL LOW (ref 39.0–52.0)
Hemoglobin: 12.6 g/dL — ABNORMAL LOW (ref 13.0–17.0)
MCH: 27.2 pg (ref 26.0–34.0)
MCHC: 32.6 g/dL (ref 30.0–36.0)
MCV: 83.6 fL (ref 78.0–100.0)
PLATELETS: 183 10*3/uL (ref 150–400)
RBC: 4.63 MIL/uL (ref 4.22–5.81)
RDW: 17.8 % — ABNORMAL HIGH (ref 11.5–15.5)
WBC: 9.8 10*3/uL (ref 4.0–10.5)

## 2014-08-25 LAB — CULTURE, BLOOD (ROUTINE X 2)
CULTURE: NO GROWTH
Culture: NO GROWTH

## 2014-08-25 LAB — COMPREHENSIVE METABOLIC PANEL
ALBUMIN: 2.4 g/dL — AB (ref 3.5–5.2)
ALT: 147 U/L — ABNORMAL HIGH (ref 0–53)
AST: 125 U/L — AB (ref 0–37)
Alkaline Phosphatase: 53 U/L (ref 39–117)
Anion gap: 9 (ref 5–15)
BUN: 22 mg/dL (ref 6–23)
CALCIUM: 8.7 mg/dL (ref 8.4–10.5)
CHLORIDE: 110 meq/L (ref 96–112)
CO2: 29 meq/L (ref 19–32)
CREATININE: 0.81 mg/dL (ref 0.50–1.35)
GFR calc Af Amer: >90 mL/min (ref >90)
Glucose, Bld: 120 mg/dL — ABNORMAL HIGH (ref 70–99)
Potassium: 3.8 mEq/L (ref 3.7–5.3)
SODIUM: 148 meq/L — AB (ref 137–147)
Total Bilirubin: 0.6 mg/dL (ref 0.3–1.2)
Total Protein: 6.3 g/dL (ref 6.0–8.3)

## 2014-08-25 LAB — URINALYSIS, ROUTINE W REFLEX MICROSCOPIC
Bilirubin Urine: NEGATIVE
GLUCOSE, UA: NEGATIVE mg/dL
HGB URINE DIPSTICK: NEGATIVE
KETONES UR: NEGATIVE mg/dL
Leukocytes, UA: NEGATIVE
Nitrite: NEGATIVE
PH: 5.5 (ref 5.0–8.0)
Protein, ur: NEGATIVE mg/dL
Specific Gravity, Urine: 1.03 (ref 1.005–1.030)
Urobilinogen, UA: 4 mg/dL — ABNORMAL HIGH (ref 0.0–1.0)

## 2014-08-25 LAB — PRO B NATRIURETIC PEPTIDE: PRO B NATRI PEPTIDE: 243.9 pg/mL — AB (ref 0–125)

## 2014-08-25 NOTE — Progress Notes (Signed)
SLP Cancellation Note  Patient Details Name: Anthony Mcclure MRN: [id number] DOB: 1951-03-12   Cancelled treatment:       Reason Eval/Treat Not Completed: Other (comment). Unable to obtain consent for FEES. Contacted DSS x2 on Friday, called cousin without response. Discussed with MD, will plan for FEES on Monday if consent can be obtained.   Herbie [place], [place] CCC-SLP [phone]  Lynann Beaver 08/25/2014, 1:51 PM

## 2014-08-25 NOTE — Progress Notes (Signed)
[name]  Anthony Mcclure ENI:[id number] DOB: Aug 28, 1951 DOA: 08/16/2014 PCP: [name], MD   Brief narrative: [name] brought to ED on 9/3 after he suffered an out of hospital cardiac arrest. Arrest was witnessed; however, no CPR was performed until 10-15 minutes later when EMS arrived. Upon their arrival, rhythm was noted to be PEA. ACLS was performed for 15 minutes before ROSC. During resuscitation, pt received 2 of epi and 1amp D50. In ED, pt was comatose and was intubated for respiratory failure. PCCM was consulted for admission and initiation of hypothermia protocol.  Postarrest EKG and enzymes were negative and not consistent with ischemic etiology to the arrest. Patient eventually stabilized and hypothermia was reversed and patient was able to be extubated. After regaining consciousness patient was unable to move his extremities. MRI of his brain was unremarkable no acute normality. MRI of the cervical spine showed abnormal increased T2 signal in the cervical cord extending from the craniocervical junction through the C3/4 level possibly from cord infarction/cord edema or chronic myelomalacia. No associated cord enhancement to suggest high-grade tumor. Neurology was consulted. Patient underwent EEG on 9/5 that demonstrated generalized slowing consistent with encephalopathic state but otherwise was unremarkable.  Unfortunately patient's symptomatology has not improved. The neurologist but with Dr. Vertell Limber in neurosurgery who reviewed the images and stated he did not see any active compression and therefore did not feel there was any role for acute surgery or steroids. Neurology was also concerned about his mental status and documented there may be some sort of hypoxic brain injury there as well. Echo showed no area of clear thrombosis and has been postulated that possibly cervical stenosis cause compromise of the vessel as well as abnormal wall  movement on rest could lead to thrombus formation with a subsequent embolic event although he felt this was less likely.  Patient has been evaluated by OT, PT and speech therapy. He currently is at high-risk for oral diet so is n.p.o. and has a feeding tube in place. Speech therapy plans fees study. He was evaluated by CIR but was deemed not an appropriate candidate for inpatient stay at this time. Recommendation is to either pursue skilled nursing facility or LTAC. [facility] has and care of this patient as of 08/24/14.  STUDIES / EVENTS:  9/3 Admitted with cardiac arrest, hypothermia started  9/3 CT Head / C-Spine >>> nad  9/4 TTE >>> EF 55%  9/5 Rewarming finished  9/5 EEG >>> nonspecific cerebral dsyfxn, no seizures  9/6 New finding of quadriparesis, MRI brain / C-spine ordered  9/7 MRI brain/C Spine: abn signal to cervical cord C3-4 ? Possible cord infarct or edema . Brain neg for acute process.  9/7 Passed SBT. Strong cough when suctioned. Extubated.   Assessment/Plan: Active Problems:   Cardiac arrest/PEA  -Based on cardiac enzymes and EKG does not appear to be ischemic in etiology-unclear what precipitated the initial event    Acute respiratory failure with hypoxia/post arrest pulmonary edema/MRSA pneumonia -Currently symptoms stable but given dysphagia at risk for aspiration at this point-continue vancomycin. Repeat CXR. C/o dyspnea    Dysphagia Continue TF. Will likely need PEG. 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     Dehydration with hypernatremia -Continue free water per tube-add dextrose infusion at 50 cc per hour    Alcohol abuse/ Hepatitis C  HTN  -Currently blood pressures controlled on Lopressor    Protein calorie malnutrition -Nutritional needs currently met by tube feedings   DVT prophylaxis: Subcutaneous heparin Code Status:  Full Family Communication: No family at bedside Disposition Plan/Expected LOS: Stepdown   Consultants: Neurology PCCM  Cultures: 9/6 tracheal aspirate positive for MRSA 9/6 blood cultures x2 negative 9/10 C. difficile PCR negative 9/11 urine culture pending  Antibiotics: Unasyn 9/6 >>> 9/9 Vancomycin 9/8 >>>  HPI/Subjective: Nods yes to SOB. Nods no to pain  Objective: Blood pressure 133/84, pulse 78, temperature 98.4 F (36.9 C), temperature source Axillary, resp. rate 22, height 6' (1.829 m), weight 90.4 kg (199 lb 4.7 oz), SpO2 95.00%.  Intake/Output Summary (Last 24 hours) at 08/25/14 1207 Last data filed at 08/25/14 1107  Gross per 24 hour  Intake   2492 ml  Output    775 ml  Net   1717 ml     Exam: Gen: nods yes and no HEENT: dried TF, insipated mucous in mouth on lips Chest: CTA without WRR Cardiac: Regular rate and rhythm, S1-S2, no rubs murmurs or gallops, no peripheral edema, no JVD Abdomen: Soft nontender nondistended. BS present Extremities: Symmetrical in appearance without cyanosis, clubbing or edema  Scheduled Meds:  Scheduled Meds: . antiseptic oral rinse  7 mL Mouth Rinse q12n4p  . aspirin  325 mg Oral Daily  . chlorhexidine  15 mL Mouth Rinse BID  . free water  200 mL Per Tube Q6H  . heparin  5,000 Units Subcutaneous 3 times per day  . metoprolol tartrate  25 mg Per Tube BID  . vancomycin  1,250 mg Intravenous Q8H   Continuous Infusions: . dextrose 50 mL/hr at 08/24/14 1254  . feeding supplement (VITAL AF 1.2 CAL) 1,000 mL (08/24/14 0340)    Data Reviewed: Basic Metabolic Panel:  Recent Labs Lab 08/19/14 0441 08/20/14 0541 08/21/14 0300 08/22/14 0635 08/23/14 0650 08/24/14 0312 08/25/14 0430  NA 141 145 143 148* 149* 151* 148*  K 4.2 3.7 4.9 3.6* 3.7 3.7 3.8  CL 108 109 107 109 110 112 110  CO2 '22 25 25 29 30 29 29  ' GLUCOSE 153* 120* 93 133* 129* 124* 120*  BUN '16 14 14 19 ' 24* 24* 22  CREATININE 0.92 0.84 0.77 0.81 0.93  0.84 0.81  CALCIUM 7.5* 7.9* 8.1* 8.8 8.4 8.4 8.7  MG 1.5 1.9 2.1 1.9 2.1  --   --   PHOS 3.1  --   --  3.3 2.8  --   --    Liver Function Tests:  Recent Labs Lab 08/19/14 0441 08/20/14 0541 08/25/14 0430  AST 120* 85* 125*  ALT 110* 84* 147*  ALKPHOS 64 62 53  BILITOT 1.1 1.2 0.6  PROT 6.4 6.4 6.3  ALBUMIN 2.7* 2.7* 2.4*   No results found for this basename: LIPASE, AMYLASE,  in the last 168 hours No results found for this basename: AMMONIA,  in the last 168 hours CBC:  Recent Labs Lab 08/19/14 0441 08/20/14 0541 08/21/14 0300 08/25/14 0430  WBC 12.6* 12.9* 11.7* 9.8  HGB 13.8 12.8* 12.4* 12.6*  HCT 40.8 39.3 37.3* 38.7*  MCV 81.6 83.8 82.9 83.6  PLT 134* 121* 128* 183   Cardiac Enzymes: No results found for this basename: CKTOTAL, CKMB, CKMBINDEX, TROPONINI,  in the last 168 hours BNP (last 3 results)  Recent Labs  02/14/14 1145  PROBNP 1322.0*   CBG:  Recent Labs Lab 08/24/14 0746 08/24/14 1226 08/24/14 1507 08/24/14 2116  08/25/14 0736  GLUCAP 118* 108* 112* 127* 121*    Recent Results (from the past 240 hour(s))  URINE CULTURE     Status: None   Collection Time    08/16/14  9:11 PM      Result Value Ref Range Status   Specimen Description URINE, CLEAN CATCH   Final   Special Requests none Normal   Final   Culture  Setup Time     Final   Value: 08/16/2014 21:58     Performed at [facility]     Final   Value: NO GROWTH     Performed at [facility]   Culture     Final   Value: NO GROWTH     Performed at [facility]   Report Status 08/18/2014 FINAL   Final  MRSA PCR SCREENING     Status: None   Collection Time    08/16/14 10:43 PM      Result Value Ref Range Status   MRSA by PCR NEGATIVE  NEGATIVE Final   Comment:            The GeneXpert MRSA Assay (FDA     approved for NASAL specimens     only), is one component of a     comprehensive MRSA colonization     surveillance program. It is not      intended to diagnose MRSA     infection nor to guide or     monitor treatment for     MRSA infections.  CULTURE, RESPIRATORY (NON-EXPECTORATED)     Status: None   Collection Time    08/19/14 11:15 AM      Result Value Ref Range Status   Specimen Description TRACHEAL ASPIRATE   Final   Special Requests NONE   Final   Gram Stain     Final   Value: ABUNDANT WBC PRESENT,BOTH PMN AND MONONUCLEAR     NO SQUAMOUS EPITHELIAL CELLS SEEN     MODERATE GRAM POSITIVE COCCI     IN CLUSTERS     Performed at [facility]   Culture     Final   Value: ABUNDANT METHICILLIN RESISTANT STAPHYLOCOCCUS AUREUS     Note: RIFAMPIN AND GENTAMICIN SHOULD NOT BE USED AS SINGLE DRUGS FOR TREATMENT OF STAPH INFECTIONS.     Performed at [facility]   Report Status 08/22/2014 FINAL   Final   Organism ID, Bacteria METHICILLIN RESISTANT STAPHYLOCOCCUS AUREUS   Final  CULTURE, BLOOD (ROUTINE X 2)     Status: None   Collection Time    08/19/14 11:25 AM      Result Value Ref Range Status   Specimen Description BLOOD LEFT ARM   Final   Special Requests BOTTLES DRAWN AEROBIC AND ANAEROBIC 5 CC   Final   Culture  Setup Time     Final   Value: 08/19/2014 19:17     Performed at [facility]   Culture     Final   Value:        BLOOD CULTURE RECEIVED NO GROWTH TO DATE CULTURE WILL BE HELD FOR 5 DAYS BEFORE ISSUING A FINAL NEGATIVE REPORT     Performed at [facility]   Report Status PENDING   Incomplete  CULTURE, BLOOD (ROUTINE X 2)     Status: None   Collection Time    08/19/14 11:30 AM      Result Value Ref Range  Status   Specimen Description BLOOD LEFT ARM   Final   Special Requests BOTTLES DRAWN AEROBIC AND ANAEROBIC 5 CC    Final   Culture  Setup Time     Final   Value: 08/19/2014 19:17     Performed at [facility]   Culture     Final   Value:        BLOOD CULTURE RECEIVED NO GROWTH TO DATE CULTURE WILL BE HELD FOR 5 DAYS BEFORE ISSUING A FINAL NEGATIVE REPORT      Performed at [facility]   Report Status PENDING   Incomplete  CLOSTRIDIUM DIFFICILE BY PCR     Status: None   Collection Time    08/23/14  1:30 PM      Result Value Ref Range Status   C difficile by pcr NEGATIVE  NEGATIVE Final    Time spent : 25 min  Doree Barthel, MD [facility] [phone]  If 7PM-7AM, please contact night-coverage [email or web address] Password [email or web address] 08/25/2014, 12:07 PM   LOS: 9 days

## 2014-08-26 LAB — BASIC METABOLIC PANEL
Anion gap: 9 (ref 5–15)
BUN: 20 mg/dL (ref 6–23)
CHLORIDE: 104 meq/L (ref 96–112)
CO2: 29 meq/L (ref 19–32)
Calcium: 8.7 mg/dL (ref 8.4–10.5)
Creatinine, Ser: 0.78 mg/dL (ref 0.50–1.35)
GFR calc Af Amer: >90 mL/min (ref >90)
Glucose, Bld: 113 mg/dL — ABNORMAL HIGH (ref 70–99)
POTASSIUM: 3.8 meq/L (ref 3.7–5.3)
SODIUM: 142 meq/L (ref 137–147)

## 2014-08-26 LAB — GLUCOSE, CAPILLARY
GLUCOSE-CAPILLARY: 106 mg/dL — AB (ref 70–99)
GLUCOSE-CAPILLARY: 124 mg/dL — AB (ref 70–99)
Glucose-Capillary: 122 mg/dL — ABNORMAL HIGH (ref 70–99)
Glucose-Capillary: 131 mg/dL — ABNORMAL HIGH (ref 70–99)
Glucose-Capillary: 115 mg/dL — ABNORMAL HIGH (ref 70–99)

## 2014-08-26 LAB — URINE CULTURE
Colony Count: NO GROWTH
Culture: NO GROWTH

## 2014-08-26 NOTE — Progress Notes (Signed)
Patient transferred to the unit from ICU, alert able to follow command. Patient made comfortable. Will continue to monitor.

## 2014-08-26 NOTE — Progress Notes (Signed)
[name]  TRINTEN BOUDOIN VOJ:[id number] DOB: 1951-09-12 DOA: 08/16/2014 PCP: [name], MD   Brief narrative: [name] brought to ED on 9/3 after he suffered an out of hospital cardiac arrest. Arrest was witnessed; however, no CPR was performed until 10-15 minutes later when EMS arrived. Upon their arrival, rhythm was noted to be PEA. ACLS was performed for 15 minutes before ROSC. During resuscitation, pt received 2 of epi and 1amp D50. In ED, pt was comatose and was intubated for respiratory failure. PCCM was consulted for admission and initiation of hypothermia protocol.  Postarrest EKG and enzymes were negative and not consistent with ischemic etiology to the arrest. Patient eventually stabilized and hypothermia was reversed and patient was able to be extubated. After regaining consciousness patient was unable to move his extremities. MRI of his brain was unremarkable no acute normality. MRI of the cervical spine showed abnormal increased T2 signal in the cervical cord extending from the craniocervical junction through the C3/4 level possibly from cord infarction/cord edema or chronic myelomalacia.  Neurology was consulted. Patient underwent EEG on 9/5 that demonstrated generalized slowing consistent with encephalopathic state but otherwise was unremarkable.  Dr. Vertell Limber in neurosurgery who reviewed the images and stated he did not see any active compression and therefore did not feel there was any role for acute surgery or steroids. Neurology was also concerned about his mental status and documented there may be some sort of hypoxic brain injury there as well.  Patient has been evaluated by OT, PT and speech therapy. He currently is at high-risk for oral diet so is n.p.o. and has a feeding tube in place. Speech therapy plans fees study. He was evaluated by CIR but was deemed not an appropriate candidate for inpatient stay at this time. Recommendation  is to either pursue skilled nursing facility or LTAC. [facility] has and care of this patient as of 08/24/14.  STUDIES / EVENTS:  9/3 Admitted with cardiac arrest, hypothermia started  9/3 CT Head / C-Spine >>> nad  9/4 TTE >>> EF 55%  9/5 Rewarming finished  9/5 EEG >>> nonspecific cerebral dsyfxn, no seizures  9/6 New finding of quadriparesis, MRI brain / C-spine ordered  9/7 MRI brain/C Spine: abn signal to cervical cord C3-4 ? Possible cord infarct or edema . Brain neg for acute process.  9/7 Passed SBT. Strong cough when suctioned. Extubated.   Assessment/Plan: Active Problems:   Cardiac arrest/PEA  -Based on cardiac enzymes and EKG does not appear to be ischemic in etiology-unclear what precipitated the initial event    Acute respiratory failure with hypoxia/post arrest pulmonary edema/MRSA pneumonia Repeat CXR negative. Lungs CTA. D/c vancomycin.    Dysphagia Continue TF. Will likely need PEG. PMT following. 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     Dehydration with hypernatremia Resolved. Cont free water    Alcohol abuse/ Hepatitis C    HTN     Protein calorie malnutrition  Urinary retention: continue foley  DVT prophylaxis: Subcutaneous heparin Code Status: Full Family Communication: No family at bedside Disposition Plan/Expected LOS: transfer to tele   Consultants: Neurology PCCM  Cultures: 9/6 tracheal aspirate positive for MRSA 9/6 blood cultures x2 negative 9/10 C. difficile PCR negative 9/11 urine culture pending  Antibiotics: Unasyn 9/6 >>> 9/9 Vancomycin 9/8 >>>9/13  HPI/Subjective: Asleep. No problems reported  Objective: Blood pressure 148/73, pulse 72, temperature 98.7 F (37.1 C), temperature source Axillary, resp. rate 24, height 6' (1.829 m), weight 90.3 kg (199 lb 1.2 oz), SpO2  95.00%.  Intake/Output Summary (Last 24 hours) at 08/26/14 1434 Last data filed at 08/26/14 1423  Gross per 24 hour  Intake   3515 ml  Output   2250 ml  Net   1265 ml     Exam: Gen: asleep Chest: CTA without WRR Cardiac: Regular rate and rhythm, S1-S2, no rubs murmurs or gallops, no peripheral edema, no JVD Abdomen: Soft nontender nondistended. BS present Extremities: Symmetrical in appearance without cyanosis, clubbing or edema  Scheduled Meds:  Scheduled Meds: . antiseptic oral rinse  7 mL Mouth Rinse q12n4p  . aspirin  325 mg Oral Daily  . chlorhexidine  15 mL Mouth Rinse BID  . free water  200 mL Per Tube Q6H  . heparin  5,000 Units Subcutaneous 3 times per day  . metoprolol tartrate  25 mg Per Tube BID  . vancomycin  1,250 mg Intravenous Q8H   Continuous Infusions: . dextrose 50 mL/hr at 08/24/14 1254  . feeding supplement (VITAL AF 1.2 CAL) 1,000 mL (08/26/14 1153)    Data Reviewed: Basic Metabolic Panel:  Recent Labs Lab 08/20/14 0541 08/21/14 0300 08/22/14 0635 08/23/14 0650 08/24/14 0312 08/25/14 0430 08/26/14 0435  NA 145 143 148* 149* 151* 148* 142  K 3.7 4.9 3.6* 3.7 3.7 3.8 3.8  CL 109 107 109 110 112 110 104  CO2 25 25 29 30 29 29 29   GLUCOSE 120* 93 133* 129* 124* 120* 113*  BUN 14 14 19  24* 24* 22 20  CREATININE 0.84 0.77 0.81 0.93 0.84 0.81 0.78  CALCIUM 7.9* 8.1* 8.8 8.4 8.4 8.7 8.7  MG 1.9 2.1 1.9 2.1  --   --   --   PHOS  --   --  3.3 2.8  --   --   --    Liver Function Tests:  Recent Labs Lab 08/20/14 0541 08/25/14 0430  AST 85* 125*  ALT 84* 147*  ALKPHOS 62 53  BILITOT 1.2 0.6  PROT 6.4 6.3  ALBUMIN 2.7* 2.4*   No results found for this basename: LIPASE, AMYLASE,  in the last 168 hours No results found for this basename: AMMONIA,  in the last 168 hours CBC:  Recent Labs Lab 08/20/14 0541 08/21/14 0300 08/25/14 0430  WBC 12.9* 11.7* 9.8  HGB 12.8* 12.4* 12.6*  HCT 39.3 37.3* 38.7*  MCV 83.8 82.9 83.6  PLT 121*  128* 183   Cardiac Enzymes: No results found for this basename: CKTOTAL, CKMB, CKMBINDEX, TROPONINI,  in the last 168 hours BNP (last 3 results)  Recent Labs  02/14/14 1145 08/25/14 0430  PROBNP 1322.0* 243.9*   CBG:  Recent Labs Lab 08/25/14 2015 08/26/14 0007 08/26/14 0443 08/26/14 0803 08/26/14 1232  GLUCAP 112* 124* 106* 122* 131*    Recent Results (from the past 240 hour(s))  URINE CULTURE     Status: None   Collection Time    08/16/14  9:11 PM      Result Value Ref Range Status   Specimen Description URINE, CLEAN CATCH   Final   Special Requests none Normal   Final   Culture  Setup Time     Final   Value: 08/16/2014 21:58     Performed at [facility]     Final  Value: NO GROWTH     Performed at [facility]     Final   Value: NO GROWTH     Performed at [facility]   Report Status 08/18/2014 FINAL   Final  MRSA PCR SCREENING     Status: None   Collection Time    08/16/14 10:43 PM      Result Value Ref Range Status   MRSA by PCR NEGATIVE  NEGATIVE Final   Comment:            The GeneXpert MRSA Assay (FDA     approved for NASAL specimens     only), is one component of a     comprehensive MRSA colonization     surveillance program. It is not     intended to diagnose MRSA     infection nor to guide or     monitor treatment for     MRSA infections.  CULTURE, RESPIRATORY (NON-EXPECTORATED)     Status: None   Collection Time    08/19/14 11:15 AM      Result Value Ref Range Status   Specimen Description TRACHEAL ASPIRATE   Final   Special Requests NONE   Final   Gram Stain     Final   Value: ABUNDANT WBC PRESENT,BOTH PMN AND MONONUCLEAR     NO SQUAMOUS EPITHELIAL CELLS SEEN     MODERATE GRAM POSITIVE COCCI     IN CLUSTERS     Performed at [facility]   Culture     Final   Value: ABUNDANT METHICILLIN RESISTANT STAPHYLOCOCCUS AUREUS     Note: RIFAMPIN AND GENTAMICIN SHOULD NOT BE USED AS  SINGLE DRUGS FOR TREATMENT OF STAPH INFECTIONS.     Performed at [facility]   Report Status 08/22/2014 FINAL   Final   Organism ID, Bacteria METHICILLIN RESISTANT STAPHYLOCOCCUS AUREUS   Final  CULTURE, BLOOD (ROUTINE X 2)     Status: None   Collection Time    08/19/14 11:25 AM      Result Value Ref Range Status   Specimen Description BLOOD LEFT ARM   Final   Special Requests BOTTLES DRAWN AEROBIC AND ANAEROBIC 5 CC   Final   Culture  Setup Time     Final   Value: 08/19/2014 19:17     Performed at [facility]   Culture     Final   Value: NO GROWTH 5 DAYS     Performed at [facility]   Report Status 08/25/2014 FINAL   Final  CULTURE, BLOOD (ROUTINE X 2)     Status: None   Collection Time    08/19/14 11:30 AM      Result Value Ref Range Status   Specimen Description BLOOD LEFT ARM   Final   Special Requests BOTTLES DRAWN AEROBIC AND ANAEROBIC 5 CC    Final   Culture  Setup Time     Final   Value: 08/19/2014 19:17     Performed at [facility]   Culture     Final   Value: NO GROWTH 5 DAYS     Performed at [facility]   Report Status 08/25/2014 FINAL   Final  CLOSTRIDIUM DIFFICILE BY PCR     Status: None   Collection Time    08/23/14  1:30 PM      Result Value Ref Range Status   C difficile by pcr NEGATIVE  NEGATIVE Final  Time spent : 15 min  Doree Barthel, MD [facility] [phone]  If 7PM-7AM, please contact night-coverage [email or web address] Password [email or web address] 08/26/2014, 2:34 PM   LOS: 10 days

## 2014-08-26 NOTE — Progress Notes (Signed)
Attempted to notify pt's cousin, [name], and Lacretia Nicks (DSS) to inform them of pt transfer. Was unable to get in touch with either person. Will pass along to next nurse.

## 2014-08-26 NOTE — Progress Notes (Signed)
Transferred pt to 4N30. Notified pts cousin [name].

## 2014-08-26 NOTE — Progress Notes (Signed)
Report called to Earlie Server RN on 4N.

## 2014-08-27 DIAGNOSIS — E46 Unspecified protein-calorie malnutrition: Secondary | ICD-10-CM

## 2014-08-27 DIAGNOSIS — Z515 Encounter for palliative care: Secondary | ICD-10-CM

## 2014-08-27 LAB — GLUCOSE, CAPILLARY
GLUCOSE-CAPILLARY: 106 mg/dL — AB (ref 70–99)
GLUCOSE-CAPILLARY: 120 mg/dL — AB (ref 70–99)
GLUCOSE-CAPILLARY: 126 mg/dL — AB (ref 70–99)
Glucose-Capillary: 103 mg/dL — ABNORMAL HIGH (ref 70–99)
Glucose-Capillary: 121 mg/dL — ABNORMAL HIGH (ref 70–99)
Glucose-Capillary: 115 mg/dL — ABNORMAL HIGH (ref 70–99)

## 2014-08-27 MED ORDER — ACETAMINOPHEN 325 MG PO TABS: 650.0000 mg | ORAL_TABLET | Freq: Four times a day (QID) | ORAL | Status: DC | PRN

## 2014-08-27 MED ORDER — ASPIRIN 325 MG PO TABS
325.0000 mg | ORAL_TABLET | Freq: Every day | ORAL | Status: DC
  Administered 2014-08-28: 325 mg
  Filled 2014-08-27: qty 1

## 2014-08-27 MED ORDER — ENOXAPARIN SODIUM 30 MG/0.3ML ~~LOC~~ SOLN
30.0000 mg | SUBCUTANEOUS | Status: DC
  Administered 2014-08-27 – 2014-08-29 (×3): 30 mg via SUBCUTANEOUS
  Filled 2014-08-27 (×3): qty 0.3

## 2014-08-27 MED ORDER — FENTANYL CITRATE 0.05 MG/ML IJ SOLN: 25.0000 ug | INTRAMUSCULAR | Status: DC | PRN

## 2014-08-27 MED ORDER — ALPRAZOLAM 0.5 MG PO TABS: 1.0000 mg | ORAL_TABLET | Freq: Three times a day (TID) | ORAL | Status: DC | PRN

## 2014-08-27 NOTE — Progress Notes (Signed)
Patient placed on Dysphagia 1 nectar thick diet and fed dinner by Probation officer. Consumed about 50% meal, drank 173ml milk. No coughing noted during feeding but some clearing of throat after feeding. Oral suctioning given with relief noted. Will continue to monitor.

## 2014-08-27 NOTE — Progress Notes (Signed)
NUTRITION FOLLOW UP  Intervention:   Continue Vital AF 1.2 at 65 ml/hr via NG tube until diet advanced. Provides 1872 kcals, 117 gm protein, 1263 ml free water daily. If diet advanced, recommend providing Magic Cup ice cream BID with meals and Ensure Pudding BID in between meals Recommend leaving NGT in place until tolerance of PO intake is confirmed; recommend providing Vital AF 1.2 @ 140 ml/hr overnight from 7 pm to 7 am to provide 2016 kcal, 126 grams of protein, and 1360 ml of water.  RD to continue to monitor  Nutrition Dx:   Inadequate oral intake related to inability to eat as evidenced by NPO status, ongoing.  Goal:   Intake to meet >90% of estimated nutrition needs. met.  Monitor:   TF tolerance/adequacy, weight trend, labs  Assessment:   [name] brought to ED on 9/3 after he suffered an out of hospital cardiac arrest. In ED, pt was comatose and was intubated for respiratory failure. PCCM was consulted for admission and initiation of hypothermia protocol.  Pt resting with eyes closed at time of visit, moaning at times. Pt continues to receive Vital AF 1.2 @ 65 ml/hr via NG tube with 200 ml free water flushes every 6 hours. Pt's weight is fairly stable, now 1 lb below admission weight. Pt assessed seen by SLP for FEES this afternoon and per SLP note, pt could potentially tolerate honey-thick liquids and pureed diet.   Glucose ranging 103 to 131 mg/dL  Height: Ht Readings from Last 1 Encounters:  08/18/14 6' (1.829 m)    Weight Status:   Wt Readings from Last 1 Encounters:  08/27/14 202 lb 6.4 oz (91.808 kg)  Admission weight: 203 lb (92.4 kg) 9/3  Body mass index is 27.44 kg/(m^2).   Re-estimated needs:  Kcal: 2000-2200 Protein: 110-135 gm Fluid: 2 L  Skin: intact; +1 generalized edema and +1 RUE and LUE edema  Diet Order: NPO   Intake/Output Summary (Last 24 hours) at 08/27/14 1503 Last data filed at 08/27/14 0531  Gross per 24 hour  Intake    265 ml   Output   1025 ml  Net   -760 ml    Last BM: 9/12   Labs:   Recent Labs Lab 08/21/14 0300 08/22/14 0635 08/23/14 0650 08/24/14 0312 08/25/14 0430 08/26/14 0435  NA 143 148* 149* 151* 148* 142  K 4.9 3.6* 3.7 3.7 3.8 3.8  CL 107 109 110 112 110 104  CO2 '25 29 30 29 29 29  ' BUN 14 19 24* 24* 22 20  CREATININE 0.77 0.81 0.93 0.84 0.81 0.78  CALCIUM 8.1* 8.8 8.4 8.4 8.7 8.7  MG 2.1 1.9 2.1  --   --   --   PHOS  --  3.3 2.8  --   --   --   GLUCOSE 93 133* 129* 124* 120* 113*    CBG (last 3)   Recent Labs  08/27/14 0103 08/27/14 0537 08/27/14 1137  GLUCAP 103* 106* 121*    Scheduled Meds: . antiseptic oral rinse  7 mL Mouth Rinse q12n4p  . aspirin  325 mg Oral Daily  . chlorhexidine  15 mL Mouth Rinse BID  . free water  200 mL Per Tube Q6H  . heparin  5,000 Units Subcutaneous 3 times per day  . metoprolol tartrate  25 mg Per Tube BID    Continuous Infusions: . feeding supplement (VITAL AF 1.2 CAL) 1,000 mL (08/27/14 0235)    Pryor Ochoa  RD, LDN Inpatient Clinical Dietitian Pager: [phone] After Hours Pager: [phone]

## 2014-08-27 NOTE — Consult Note (Signed)
  Palliative Medicine Team at [facility]  Date: 08/27/2014   Patient Name: Anthony Mcclure  DOB: January 28, 1951  MRN: [id number]  Age / Sex: 63 y.o., male   PCP: [name], MD Referring Physician: Cristal Ford, DO  Active Problems: 1. C-Spine Cord Contusion-post traumatic 2. Quadraplegia 3. Dysphagia 4. Hepatitis C 5. Severe Alcoholism-has never been in remission 6. Protien Calorie Malnutrition 7. Prior history of A. Fib and A. Flutter 8. Prior History of Seizure Disorder related to ETOH 9. Alcoholic Cardiomyopathy/CHF 10. HTN 11. Medical Non-adherence  Tremar is much improved since I saw him 2 days ago. He opens his eye, follows simple commands ie. Look to the left/right/up/down. He does not move his extremities, except for his right leg which in involuntary. He cannot cough on command- he is having difficult managing his secretions but did well enough on his FEES to do a trial of nectar thick puree D1.   [name] his cousin was unable to attend today's meeting-awaiting return call.  His DSS CSW did attend today- they provided me with Everest's brother's information - I was able to get in touch with [name], Sandra's brother [phone]- he lives in an assisted living in [place] - he has been there since he suffered a stroke-he is able to talk and carry on a conversation. He was told that his brother was killed so he was surprised to hear that he was still alive. He wants to see his brother but has no transportation. He tells me that [name] should be his brother's Media planner.  Recommendations:  1. Pursue Guardianship 2. Need to meet with [name] re: code status, goals of care, PEG etc.. 3. Started on Honey thick diet- I doubt he will be able to sustain his nutrition -will leave [facility] for now. 4. Consider re-imaging for prognostication- still unclear if this cord was contusion or edema or traumatic- he had an unusual looking "polyp" on his vocal cords which may need further  evaluation.  Disposition: Complicated- needs a guardian- he is able to mouth words like yes and no but inconsistently- could not assess capacity- will ask SLP to help with cognitive and communication capacity/recs on communication tools.  He is probably going to need long term SNF and we need to establish very clear goals of care and plans for how we will manage future complications of his quadriplegia.   Time In: 3PM Time Out: 4:30PM Time Total: 90 minutes Greater than 50%  of this time was spent counseling and coordinating care related to the above assessment and plan.  Signed by: Roma Schanz, DO  08/27/2014, 3:06 PM  Please contact Palliative Medicine Team phone at [phone] for questions and concerns.

## 2014-08-27 NOTE — Progress Notes (Signed)
UR COMPLETED  

## 2014-08-27 NOTE — Progress Notes (Signed)
Occupational Therapy Treatment Patient Details Name: Anthony Mcclure MRN: [id number] DOB: 10-12-1951 Today's Date: 08/27/2014    History of present illness 63 yo adm 08/16/14 s/p cardiac arrest with 15-30 minute prior to return of spontaneous circulation (ROSC). Pt intubated and underwent hypothermia protocol. 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 MRI c-spine showed abnormal signal in the cord from the craniocervical junction through C3-4 level.  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 PMHx-CHF, hepatitis C, ETOH abuse, substance abuse, seizures, forearm reconstruction   OT comments  PT currently with POC meeting at 3pm today. IF patient is provided a PEG: please MD order: ted hose for therapy / OOB mobility with lift, order Q2 turning by RN / tech staff, order bil LE prafo boots.   If family declines Peg placement please discontinue OT / PT orders.  Orthostatic BPs  Supine 119/62  Sitting 100/59  supine 120/66   Session very limited by orthostatic BP and autonomic dysreflexia. Pt provided extensive oral care and hygiene performed with new gown before return to supine. Pt with decr diaphoretic state supine in new position and mattress in a softer setting. RN and tech educated on monitoring the bed setting.    Follow Up Recommendations  LTACH    Equipment Recommendations  Other (comment) (mattress overlay, hospital bed, turn Q2, w/c ,hoyer lift)    Recommendations for Other Services      Precautions / Restrictions Precautions Precautions: Fall Precaution Comments: monitor HR, watch BP, reposition if demonstrates autonomic dysreflexia       Mobility Bed Mobility Overal bed mobility: Needs Assistance Bed Mobility: Supine to Sit;Sit to Supine Rolling: Total assist;+2 for physical assistance;+2 for safety/equipment Sidelying to sit: Total assist;+2 for physical assistance;+2 for safety/equipment Supine to sit: Total assist;+2 for physical assistance;+2 for  safety/equipment Sit to supine: Total assist;+2 for safety/equipment;+2 for physical assistance   General bed mobility comments: Pt with total dependence on all mobility. pt with no activation of UB  Transfers                      Balance   Sitting-balance support: No upper extremity supported;Feet supported Sitting balance-Leahy Scale: Zero                             ADL Overall ADL's : Needs assistance/impaired Eating/Feeding: NPO   Grooming: Total assistance;Bed level   Upper Body Bathing: Total assistance;Bed level   Lower Body Bathing: Total assistance;Bed level   Upper Body Dressing : Total assistance;Bed level                   Functional mobility during ADLs:  (not appropriate) General ADL Comments: pt supine on arrival with signs of autonomic dysreflexia. Pt very diaphoretic. Pt with BP obtained see vitals supine sitting supine. Pt demonstrates orthostatic BP. Pt could benefit from ted hoses for mobility. pt with poor neck control and requires total (A) for neutral position in sitting. Pt with poor oral secretions. Pt provided oral care in detail witih hand over hand care. Pt return to supine due to orthostatic and repositoned with decr in diaphoretic state. Pt verbalized yes when asked if comfortable. Bed was in firm position on arrival. Pt with firmness reduced to prevent wound on mattress overlay. RNs and tech educated on bed settings and current need for additional turns.      Vision  Perception     Praxis      Cognition   Behavior During Therapy: WFL for tasks assessed/performed Overall Cognitive Status: Difficult to assess                       Extremity/Trunk Assessment               Exercises Other Exercises Other Exercises: PROM of BIL UE and repositioned in an elevated abducted position on pillows   Shoulder Instructions       General Comments      Pertinent Vitals/ Pain        Pain Assessment: No/denies pain  Home Living                                          Prior Functioning/Environment              Frequency Min 2X/week     Progress Toward Goals  OT Goals(current goals can now be found in the care plan section)  Progress towards OT goals: Not progressing toward goals - comment  Acute Rehab OT Goals Patient Stated Goal: uanble to state/communicate OT Goal Formulation: Patient unable to participate in goal setting Time For Goal Achievement: 09/04/14 Potential to Achieve Goals: Fair ADL Goals Additional ADL Goal #1: Pt will tolerate EOB sitting x 7 mins without evidence of orthosasis in prep for transfers to chair Additional ADL Goal #2: Family will be independent with PROM bil. UEs Additional ADL Goal #3: Pt will tolerate splint wear without evidence of skin breakdown   Plan Discharge plan needs to be updated    Co-evaluation                 End of Session     Activity Tolerance Other (comment) (BP limiting session and diaphoretic)   Patient Left in bed;with call bell/phone within reach   Nurse Communication Mobility status;Need for lift equipment;Precautions        Time: 9892-1194 OT Time Calculation (min): 30 min  Charges: OT General Charges $OT Visit: 1 Procedure OT Treatments $Self Care/Home Management : 23-37 mins  Peri Maris 08/27/2014, 12:28 PM Pager: [phone]

## 2014-08-27 NOTE — Procedures (Signed)
Objective Swallowing Evaluation: Fiberoptic Endoscopic Evaluation of Swallowing  Patient Details  Name: Anthony Mcclure MRN: [id number] Date of Birth: 23-Oct-1951  Today's Date: 08/27/2014 Time: 1320-1400 SLP Time Calculation (min): 40 min  Past Medical History:  Past Medical History  Diagnosis Date  . CHF (congestive heart failure)   . Alcohol abuse   . Seizure   . Noncompliance with medication treatment due to intermittent use of medication 02/02/2012  . HTN (hypertension) 02/02/2012  . Alcohol withdrawal seizure 02/02/2012  . Hypertension   . Dysrhythmia     paroxismal atrial fib  . Paroxysmal Atrial Flutter 02/01/2012  . Hepatitis C 02/02/2012    Unsure if he was ever diagnosed with Hep C  . Hepatitis C    Past Surgical History:  Past Surgical History  Procedure Laterality Date  . Forearm reconstruction     HPI:  [name] brought to ED on 9/3 after he suffered an out of hospital cardiac arrest. No CPR was performed until 10-15 minutes later when EMS arrived. In ED, pt was comatose and was intubated for respiratory failure, hypothermia protocol initiated. Following intubation, he became hypotensive. MRI brain/C Spine: abn signal to cervical cord C3-4 ? Possible cord infarct or edema . Brain neg for acute process.  EEG is consistent with a generalized nonspecific cerebral dysfunction (encephalopathy). Pt intubated from 9/3-9/7.      Assessment / Plan / Recommendation Clinical Impression  Clinical impression: Pt demonstrates surprising ability to protect airway with no pooled oropharyngeal secretions despite wet quality and frequent expectoration of mucous.   Moderate oral dysphagia characterized by reduced lingual coordination of boluses and moderate oral residuals that fall to the airway post swallow. Pt does have a significant delay in swallow initiation with penetration and possible aspiration of nectar thick liquids before the swallow. Sensation of penetrate is adequate and  strong cough and second swallow protects airway. Pt more successfully manages teaspoon bites of puree and honey thick liquids with very little oropharyngeal residual.  Primary risk is from oral residuals that spill to airway post swallow, though again pt demonstrates ability to expectorate penetrates as needed.   Feel pt could potentially tolerate honey thick liquids and pureed diet without alternate nutrition. Discussed with Dr. Hilma Favors who will take these findings into account at plan of care meeting. Also of note, pt observed to have appearance of a polyp on the left anterior vocal fold that flips in and out of airway. This could be related to intubation and trauma, or present prior to admit. MD aware. Will follow for plan of care.     Treatment Recommendation  Therapy as outlined in treatment plan below    Diet Recommendation Alternative means - temporary;Dysphagia 1 (Puree);Honey-thick liquid   Liquid Administration via: Spoon Medication Administration: Crushed with puree Supervision:  (total assist) Compensations: Slow rate;Small sips/bites;Multiple dry swallows after each bite/sip;Clear throat intermittently Postural Changes and/or Swallow Maneuvers: Seated upright 90 degrees;Out of bed for meals    Other  Recommendations Oral Care Recommendations: Oral care BID Other Recommendations: Order thickener from pharmacy;Have oral suction available   Follow Up Recommendations  Inpatient Rehab    Frequency and Duration min 2x/week  2 weeks   Pertinent Vitals/Pain NA    SLP Swallow Goals     General HPI: [name] brought to ED on 9/3 after he suffered an out of hospital cardiac arrest. No CPR was performed until 10-15 minutes later when EMS arrived. In ED, pt was comatose and was  intubated for respiratory failure, hypothermia protocol initiated. Following intubation, he became hypotensive. MRI brain/C Spine: abn signal to cervical cord C3-4 ? Possible cord infarct or edema . Brain neg  for acute process.  EEG is consistent with a generalized nonspecific cerebral dysfunction (encephalopathy). Pt intubated from 9/3-9/7.  Type of Study: Fiberoptic Endoscopic Evaluation of Swallowing Diet Prior to this Study: NPO Temperature Spikes Noted: Yes Respiratory Status: Room air History of Recent Intubation: Yes Length of Intubations (days): 4 days Date extubated: 08/20/14 Behavior/Cognition: Alert;Decreased sustained attention;Requires cueing;Confused;Cooperative Oral Cavity - Dentition: Adequate natural dentition Self-Feeding Abilities: Total assist Patient Positioning: Upright in bed Baseline Vocal Quality: Low vocal intensity Volitional Cough: Strong Volitional Swallow: Unable to elicit Anatomy: Other (Comment) (appearance of polyp on left anterior vocal fold)    Reason for Referral     Oral Phase Oral Preparation/Oral Phase Oral Phase: Impaired Oral - Honey Oral - Honey Teaspoon: Lingual/palatal residue;Delayed oral transit;Weak lingual manipulation;Lingual pumping;Reduced posterior propulsion Oral - Nectar Oral - Nectar Teaspoon: Lingual/palatal residue;Delayed oral transit;Weak lingual manipulation;Lingual pumping;Reduced posterior propulsion Oral - Solids Oral - Puree: Lingual/palatal residue;Delayed oral transit;Weak lingual manipulation;Lingual pumping;Reduced posterior propulsion   Pharyngeal Phase Pharyngeal Phase Pharyngeal Phase: Impaired Pharyngeal - Honey Pharyngeal - Honey Teaspoon: Delayed swallow initiation;Penetration/Aspiration after swallow;Pharyngeal residue - posterior pharnyx;Lateral channel residue;Compensatory strategies attempted (Comment) (second swallow - tactile/verbal cues) Penetration/Aspiration details (honey teaspoon): Material enters airway, remains ABOVE vocal cords then ejected out;Material does not enter airway Pharyngeal - Nectar Pharyngeal - Nectar Teaspoon: Delayed swallow initiation;Penetration/Aspiration before  swallow;Penetration/Aspiration after swallow;Moderate aspiration;Pharyngeal residue - posterior pharnyx;Pharyngeal residue - pyriform sinuses;Inter-arytenoid space residue;Lateral channel residue Penetration/Aspiration details (nectar teaspoon): Material enters airway, CONTACTS cords then ejected out;Material enters airway, passes BELOW cords then ejected out Pharyngeal - Solids Pharyngeal - Puree: Delayed swallow initiation;Pharyngeal residue - posterior pharnyx;Lateral channel residue;Compensatory strategies attempted (Comment) Penetration/Aspiration details (puree): Material does not enter airway  Cervical Esophageal Phase    GO    Cervical Esophageal Phase Cervical Esophageal Phase: [facility]        Herbie Baltimore, MA CCC-SLP [phone]  Nikolus Marczak, Katherene Ponto 08/27/2014, 2:55 PM

## 2014-08-27 NOTE — Progress Notes (Signed)
[facility]                                                                              Patient Demographics  Anthony Mcclure, is a 63 y.o. male, DOB - 1951/04/30, VOJ:[id number]  Admit date - 08/16/2014   Admitting Physician Anthony Mires, MD  Outpatient Primary MD for the patient is Anthony Marek, MD  LOS - 11   Chief Complaint  Patient presents with  . Cardiac Arrest      HPI/Interim history [name] brought to ED on 9/3 after he suffered an out of hospital cardiac arrest. Arrest was witnessed; however, no CPR was performed until 10-15 minutes later when EMS arrived. Upon their arrival, rhythm was noted to be PEA. ACLS was performed for 15 minutes before ROSC. During resuscitation, pt received 2 of epi and 1amp D50. In ED, pt was comatose and was intubated for respiratory failure. PCCM was consulted for admission and initiation of hypothermia protocol.   Postarrest EKG and enzymes were negative and not consistent with ischemic etiology to the arrest. Patient eventually stabilized and hypothermia was reversed and patient was able to be extubated. After regaining consciousness patient was unable to move his extremities. MRI of his brain was unremarkable no acute normality. MRI of the cervical spine showed abnormal increased T2 signal in the cervical cord extending from the craniocervical junction through the C3/4 level possibly from cord infarction/cord edema or chronic myelomalacia. Neurology was consulted. Patient underwent EEG on 9/5 that demonstrated generalized slowing consistent with encephalopathic state but otherwise was unremarkable.   Dr. Vertell Mcclure in neurosurgery who reviewed the images and stated he did not see any active compression and therefore did not feel there was any role for acute surgery or steroids. Neurology was also concerned about his mental status and documented there may be some sort of hypoxic brain injury there as well.   Patient has been evaluated by OT, PT  and speech therapy. He currently is at high-risk for oral diet so is n.p.o. and has a feeding tube in place. Speech therapy plans fees study. He was evaluated by CIR but was deemed not an appropriate candidate for inpatient stay at this time. Recommendation is to either pursue skilled nursing facility or LTAC. [facility] has taken over care of this patient as of 08/24/14.  Patient is not a candidate for LTAC.  Palliative care has been consulted, meeting with patient's cousin for 3pm on 08/27/2014.  No decision has been made regarding possible peg tube.  Pending further recommendations from palliative care.  Assessment & Plan   Cardiac arrest/PEA  -Based on cardiac enzymes and EKG does not appear to be ischemic in etiology-unclear what precipitated the initial event   Acute respiratory failure with hypoxia/post arrest pulmonary edema/MRSA pneumonia  -Repeat CXR negative. Lungs CTA. -Vancomycin discontinued  Dysphagia  -Continue TF. Will likely need PEG. PMT following. 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   Dehydration with hypernatremia  -Resolved. Continue free water   Alcohol abuse/ Hepatitis C   Hypertension  Protein calorie malnutrition  -Currently feeding TF through NG tube  Urinary retention: continue foley  Code Status: Full  Family Communication: None at bedside.  Disposition Plan: Admitted.  Palliative care meeting at 3pm today.  Time Spent in minutes   30 minutes  Procedures/Studies/Events 9/3 Admitted with cardiac arrest, hypothermia started  9/3 CT Head / C-Spine >>> nad  9/4 TTE >>> EF 55%  9/5 Rewarming finished  9/5 EEG >>> nonspecific cerebral dsyfxn, no seizures  9/6 New finding of quadriparesis, MRI brain / C-spine ordered  9/7 MRI brain/C Spine: abn signal to cervical cord C3-4 ? Possible cord  infarct or edema . Brain neg for acute process.  9/7 Passed SBT. Strong cough when suctioned. Extubated.   Consults   PCCM Neurology Palliative Care CIR  DVT Prophylaxis  Heparin  Lab Results  Component Value Date   PLT 183 08/25/2014    Medications  Scheduled Meds: . antiseptic oral rinse  7 mL Mouth Rinse q12n4p  . aspirin  325 mg Oral Daily  . chlorhexidine  15 mL Mouth Rinse BID  . free water  200 mL Per Tube Q6H  . heparin  5,000 Units Subcutaneous 3 times per day  . metoprolol tartrate  25 mg Per Tube BID   Continuous Infusions: . feeding supplement (VITAL AF 1.2 CAL) 1,000 mL (08/27/14 0235)   PRN Meds:.acetaminophen, fentaNYL, pneumococcal 23 valent vaccine  Antibiotics    Anti-infectives   Start     Dose/Rate Route Frequency Ordered Stop   08/23/14 1100  vancomycin (VANCOCIN) 1,250 mg in sodium chloride 0.9 % 250 mL IVPB  Status:  Discontinued     1,250 mg 166.7 mL/hr over 90 Minutes Intravenous Every 8 hours 08/23/14 1036 08/26/14 1436   08/22/14 0030  vancomycin (VANCOCIN) IVPB 1000 mg/200 mL premix  Status:  Discontinued     1,000 mg 200 mL/hr over 60 Minutes Intravenous Every 8 hours 08/21/14 1559 08/23/14 1036   08/21/14 1630  vancomycin (VANCOCIN) 1,750 mg in sodium chloride 0.9 % 500 mL IVPB     1,750 mg 250 mL/hr over 120 Minutes Intravenous  Once 08/21/14 1559 08/21/14 1838   08/19/14 1200  Ampicillin-Sulbactam (UNASYN) 3 g in sodium chloride 0.9 % 100 mL IVPB  Status:  Discontinued     3 g 100 mL/hr over 60 Minutes Intravenous Every 6 hours 08/19/14 1140 08/22/14 1027        Subjective:   Anthony Mcclure seen and examined today.  Patient currently nonverbal and does not follow commands.    Objective:   Filed Vitals:   08/27/14 0104 08/27/14 0500 08/27/14 0528 08/27/14 1002  BP: 134/63  139/83 128/76  Pulse: 70  73 67  Temp: 99.9 F (37.7 C)  99.7 F (37.6 C) 99.5 F (37.5 C)  TempSrc: Axillary  Axillary Oral  Resp: 18  20 20     Height:      Weight:  91.808 kg (202 lb 6.4 oz)    SpO2: 100%  98% 97%    Wt Readings from Last 3 Encounters:  08/27/14 91.808 kg (202 lb 6.4 oz)  04/20/14 92.443 kg (203 lb 12.8 oz)  02/19/14 91.264 kg (201 lb 3.2 oz)     Intake/Output Summary (Last 24 hours) at 08/27/14 1309 Last data filed at 08/27/14 0531  Gross per 24 hour  Intake    395 ml  Output   1175 ml  Net   -780 ml    Exam  General: Well developed,  well nourished, NAD, appears stated age  3: NCAT,  mucous membranes moist. NGTube in place, poor dentition  Cardiovascular: S1 S2 auscultated, no rubs, murmurs or gallops. Regular rate and rhythm.  Respiratory: Clear to auscultation bilaterally with equal chest rise  Abdomen: Soft, nontender, nondistended, + bowel sounds  Extremities: warm dry without cyanosis clubbing  Data Review   Micro Results Recent Results (from the past 240 hour(s))  CULTURE, RESPIRATORY (NON-EXPECTORATED)     Status: None   Collection Time    08/19/14 11:15 AM      Result Value Ref Range Status   Specimen Description TRACHEAL ASPIRATE   Final   Special Requests NONE   Final   Gram Stain     Final   Value: ABUNDANT WBC PRESENT,BOTH PMN AND MONONUCLEAR     NO SQUAMOUS EPITHELIAL CELLS SEEN     MODERATE GRAM POSITIVE COCCI     IN CLUSTERS     Performed at [facility]   Culture     Final   Value: ABUNDANT METHICILLIN RESISTANT STAPHYLOCOCCUS AUREUS     Note: RIFAMPIN AND GENTAMICIN SHOULD NOT BE USED AS SINGLE DRUGS FOR TREATMENT OF STAPH INFECTIONS.     Performed at [facility]   Report Status 08/22/2014 FINAL   Final   Organism ID, Bacteria METHICILLIN RESISTANT STAPHYLOCOCCUS AUREUS   Final  CULTURE, BLOOD (ROUTINE X 2)     Status: None   Collection Time    08/19/14 11:25 AM      Result Value Ref Range Status   Specimen Description BLOOD LEFT ARM   Final   Special Requests BOTTLES DRAWN AEROBIC AND ANAEROBIC 5 CC   Final   Culture  Setup Time      Final   Value: 08/19/2014 19:17     Performed at [facility]   Culture     Final   Value: NO GROWTH 5 DAYS     Performed at [facility]   Report Status 08/25/2014 FINAL   Final  CULTURE, BLOOD (ROUTINE X 2)     Status: None   Collection Time    08/19/14 11:30 AM      Result Value Ref Range Status   Specimen Description BLOOD LEFT ARM   Final   Special Requests BOTTLES DRAWN AEROBIC AND ANAEROBIC 5 CC    Final   Culture  Setup Time     Final   Value: 08/19/2014 19:17     Performed at [facility]   Culture     Final   Value: NO GROWTH 5 DAYS     Performed at [facility]   Report Status 08/25/2014 FINAL   Final  CLOSTRIDIUM DIFFICILE BY PCR     Status: None   Collection Time    08/23/14  1:30 PM      Result Value Ref Range Status   C difficile by pcr NEGATIVE  NEGATIVE Final  URINE CULTURE     Status: None   Collection Time    08/25/14  4:45 AM      Result Value Ref Range Status   Specimen Description URINE, CATHETERIZED   Final   Special Requests NONE   Final   Culture  Setup Time     Final   Value: 08/25/2014 16:02     Performed at [place]     Final   Value: NO GROWTH     Performed at [facility]   Culture     Final   Value: NO GROWTH     Performed at [facility]   Report Status 08/26/2014 FINAL   Final    Radiology Reports Ct Head Wo Contrast  08/16/2014   CLINICAL DATA:  CPR  EXAM: CT HEAD WITHOUT CONTRAST  CT MAXILLOFACIAL WITHOUT CONTRAST  TECHNIQUE: Multidetector CT imaging of the head and cervical spine structures were performed using the standard protocol without intravenous contrast. Multiplanar CT image reconstructions of the maxillofacial structures were also generated.  COMPARISON:  10/24/2010 and 06/14/2009  FINDINGS: CT HEAD FINDINGS  Stable left frontal soft tissue lipoma over the periosteum.  Global atrophy and chronic ischemic changes are stable. No mass effect, midline shift,  or acute intracranial hemorrhage. Mastoid air cells are clear. Minimal mucosal thickening in the ethmoid air cells. Old fracture of the medial wall of the left orbit.  CT CERVICAL SPINE FINDINGS  No acute fracture. No dislocation. 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 There is multilevel spinal stenosis. No obvious spinal hematoma. No obvious soft tissue injury. Endotracheal tube is in place.  IMPRESSION: No acute intracranial pathology.  No evidence of acute cervical spine injury. Advanced degenerative changes are noted.   Electronically Signed   By: Maryclare Bean M.D.   On: 08/16/2014 19:59   Ct Cervical Spine Wo Contrast  08/16/2014   CLINICAL DATA:  CPR  EXAM: CT HEAD WITHOUT CONTRAST  CT MAXILLOFACIAL WITHOUT CONTRAST  TECHNIQUE: Multidetector CT imaging of the head and cervical spine structures were performed using the standard protocol without intravenous contrast. Multiplanar CT image reconstructions of the maxillofacial structures were also generated.  COMPARISON:  10/24/2010 and 06/14/2009  FINDINGS: CT HEAD FINDINGS  Stable left frontal soft tissue lipoma over the periosteum.  Global atrophy and chronic ischemic changes are stable. No mass effect, midline shift, or acute intracranial hemorrhage. Mastoid air cells are clear. Minimal mucosal thickening in the ethmoid air cells. Old fracture of the medial wall of the left orbit.  CT CERVICAL SPINE FINDINGS  No acute fracture. No dislocation. 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 There is multilevel spinal stenosis. No obvious spinal hematoma. No obvious soft tissue injury. Endotracheal tube is in place.  IMPRESSION: No acute intracranial pathology.  No evidence of acute cervical spine injury. Advanced degenerative changes are noted.   Electronically Signed   By: Maryclare Bean M.D.   On: 08/16/2014 19:59   Mr Jeri Cos HY Contrast  08/19/2014   CLINICAL DATA:  63 year old hypertensive male  with history of alcohol abuse, alcohol withdrawal seizures, hepatitis and atrial flutter. Found down and received CPR. Unable to move upper extremities post CPR. Old head injury.  EXAM: MRI HEAD WITHOUT AND WITH CONTRAST  TECHNIQUE: Multiplanar, multiecho pulse sequences of the brain and surrounding structures were obtained without and with intravenous contrast.  CONTRAST:  20 cc MultiHance.  COMPARISON:  MR cervical spine performed the same date and dictated separately.  Head CT 08/16/2014.  No comparison brain MR.  FINDINGS: Some the sequences are motion degraded.  No acute infarct  No intracranial hemorrhage.  Global atrophy. Ventricular size may be related to atrophy rather than hydrocephalus.  No intracranial mass or abnormal enhancement.  No MR evidence of Wernicke's encephalopathy.  Major intracranial vascular structures are patent.  Abnormal appearance of the upper cervical spine better delineated on recent cervical spine MR. Please see report from such. An additional consideration  is that the patient had a traumatic event (such as a fall) which caused cord contusion contributing to patient's inability to move extremities. Subacute combined degeneration/ vitamin D deficiency contributing to the cervical cord changes although possible given the patient's history of alcohol use is felt unlikely as cause of the current findings.  Prominent pannus.  This contributes to baseline spinal stenosis.  Left frontal scalp lipoma.  IMPRESSION: Some the sequences are motion degraded.  No acute infarct  No intracranial hemorrhage.  Global atrophy. Ventricular size may be related to atrophy rather than hydrocephalus.  No intracranial mass or abnormal enhancement.  No MR evidence of Wernicke's encephalopathy.  Abnormal appearance of the upper cervical spine better delineated on recent cervical spine MR. Please see report from such. An additional consideration is that the patient had a traumatic event (such as a fall) which  caused cord contusion contributing to patient's inability to move extremities.   Electronically Signed   By: Chauncey Cruel M.D.   On: 08/19/2014 17:56   Mr Cervical Spine W Wo Contrast  08/19/2014   CLINICAL DATA:  Found down hypothermic, received CPR. Pneumonia. Not moving upper extremities. Ventilator. Sedated.  EXAM: MRI CERVICAL SPINE WITHOUT AND WITH CONTRAST  TECHNIQUE: Multiplanar and multiecho pulse sequences of the cervical spine, to include the craniocervical junction and cervicothoracic junction, were obtained according to standard protocol without and with intravenous contrast.  CONTRAST:  22mL MULTIHANCE GADOBENATE DIMEGLUMINE 529 MG/ML IV SOLN  COMPARISON:  08/16/2014  FINDINGS: Despite efforts by the technologist and patient, motion artifact is present on today's exam and could not be eliminated. This reduces exam sensitivity and specificity.  Chronic degenerative findings and pannus posterior to the odontoid with likely chronic erosion of the odontoid, causing exaggerated curvature of the thecal sac at the craniocervical junction. Low-grade enhancement surrounds this pannus. Abnormal but low grade osseous edema noted anteriorly in C1 and C2.  The patient is orally intubated.  Abnormal increased T2 signal in the cord extends from the skull base through the C3-4 level, especially centrally, and most severely at the C1 vertebral level. No associated enhancement in the cord to suggest that this is due to high-grade tumor. Gradient images are severely limited by motion artifact.  There is reversal of the normal cervical lordosis with loss of intervertebral disc height at C6-7 and C7-T 1. Multilevel degenerative endplate findings especially at C7-T1. Diffuse low-grade third spacing of fluid in the neck.  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  Additional findings at individual levels are as follows:  C1-2: Pannus and exaggerated curvature at the cervicothoracic junction cause  moderate central narrowing of the thecal sac. Spurring from C1 causes at least mild bilateral foraminal stenosis, and there is a posterior fusion of the left C1-2 articulation.  C2-3: Prominent left and moderate right foraminal stenosis with moderate central narrowing of the thecal sac due to posterior osseous ridging, disc bulge, and facet arthropathy.  C3-4: Prominent left and moderate right foraminal stenosis with mild central narrowing of the thecal sac due to uncinate spurring facet arthropathy, and disc bulge.  C4-5: Moderate right and mild left foraminal stenosis due to uncinate and facet spurring.  C5-6: Moderate left foraminal stenosis due to facet arthropathy and uncinate spurring.  C6-7: Moderate bilateral foraminal stenosis due to uncinate and facet spurring along with mild disc bulge.  C7-T1: Prominent bilateral foraminal stenosis due to posterior osseous ridging, uncinate spurring, and facet arthropathy.  IMPRESSION: 1. Abnormal signal in the cervical  cord extending from the craniocervical junction through the C3-4 level, possibly from cord infarct, cord edema, or chronic myelomalacia. No associated cord enhancement to suggest high-grade tumor. 2. Chronic degenerative findings at C1-2 and C2-3 with considerable pannus posterior to the odontoid, causing exaggerated curvature of the cord and central narrowing of the thecal sac. 3. Cervical spondylosis and degenerative disc disease is also present. Overall associated impingement is graded as prominent at C2-3, C3-4, and C7-T1; and moderate at C1-2, C4-5, C5-6, and C6-7, as detailed above.   Electronically Signed   By: Sherryl Barters M.D.   On: 08/19/2014 16:55   Dg Chest Port 1 View  08/25/2014   CLINICAL DATA:  Dyspnea  EXAM: PORTABLE CHEST - 1 VIEW  COMPARISON:  08/22/2014  FINDINGS: Mild to moderate cardiac enlargement stable. Feeding tube crosses the gastroesophageal junction and right internal jugular central line also in unchanged position.   Vascular pattern is normal. No consolidation or effusion. Mild residual left lower lobe atelectasis.  IMPRESSION: No active disease.   Electronically Signed   By: Skipper Cliche M.D.   On: 08/25/2014 12:26   Dg Chest Port 1 View  08/22/2014   CLINICAL DATA:  Shortness of breath.  Evaluate atelectasis  EXAM: PORTABLE CHEST - 1 VIEW  COMPARISON:  08/21/2014; 08/20/2014; 08/19/2014  FINDINGS: Grossly unchanged enlarged cardiac silhouette. Bilateral infrahilar heterogeneous opacities are unchanged. No new focal airspace opacities. No evidence of edema. No pleural effusion. A skin fold overlies the right upper lobe. No pleural effusion. Unchanged bones.  IMPRESSION: 1. Grossly unchanged bilateral infrahilar heterogeneous opacities, likely atelectasis. 2. A skin fold overlies the right upper lung. No definite pneumothorax. Further evaluation could be performed with PA and lateral chest radiograph as clinically indicated.   Electronically Signed   By: Sandi Mariscal M.D.   On: 08/22/2014 07:47   Dg Chest Port 1 View  08/21/2014   CLINICAL DATA:  Extubated patient.  EXAM: PORTABLE CHEST - 1 VIEW  COMPARISON:  08/20/2014  FINDINGS: Since the prior study, the endotracheal tube and nasogastric tube have been removed. The right internal jugular central venous catheter remains unchanged.  Right lung base opacity has improved. No new areas of lung opacity. No pulmonary edema. No pleural effusion or pneumothorax on this semi-erect study.  Cardiac silhouette is normal in size. No mediastinal or hilar masses.  IMPRESSION: 1. Improved right lung base opacity consistent with an improved infiltrate or improved atelectasis. 2. Status post extubation and removal of the nasogastric tube.   Electronically Signed   By: Lajean Manes M.D.   On: 08/21/2014 07:37   Dg Chest Port 1 View  08/20/2014   CLINICAL DATA:  Acute respiratory failure. On ventilator. Status post cardiac arrest.  EXAM: PORTABLE CHEST - 1 VIEW  COMPARISON:  08/19/2014   FINDINGS: Mild cardiomegaly is stable. Increased opacity is seen in the right lower lobe silhouetting the right hemidiaphragm. No evidence of pleural effusion or pneumothorax. Support lines and tubes in appropriate position.  IMPRESSION: Increased right lower lobe infiltrate.  Stable mild cardiomegaly.   Electronically Signed   By: Earle Gell M.D.   On: 08/20/2014 09:15   Dg Chest Port 1 View  08/19/2014   CLINICAL DATA:  Intubated. Congestive heart failure. Alcohol abuse.  EXAM: PORTABLE CHEST - 1 VIEW  COMPARISON:  One day prior  FINDINGS: Endotracheal tube unchanged with tip 3.5 cm above carina. Nasogastric tube extends beyond the inferior aspect of the film. Right internal jugular line tip mid  SVC.  Mild cardiomegaly. No pleural effusion or pneumothorax. Improved to resolved left base airspace disease. Developing right base opacity.  IMPRESSION: Decreased to resolved left base airspace disease. New developing right base airspace disease. Favor shifting atelectasis. Early right-sided infection cannot be excluded.  Cardiomegaly without congestive failure.   Electronically Signed   By: Abigail Miyamoto M.D.   On: 08/19/2014 07:38   Dg Chest Port 1 View  08/18/2014   CLINICAL DATA:  Endotracheal to  EXAM: PORTABLE CHEST - 1 VIEW  COMPARISON:  Yesterday  FINDINGS: Tubular device is stable. No pneumothorax. Normal heart size. Increasing left basilar airspace disease.  IMPRESSION: Increasing left basilar airspace disease.   Electronically Signed   By: Maryclare Bean M.D.   On: 08/18/2014 07:34   Portable Chest Xray In Am  08/17/2014   CLINICAL DATA:  Reassess support tube placement.  EXAM: PORTABLE CHEST - 1 VIEW  COMPARISON:  Portable chest x-ray of August 16, 2014  FINDINGS: The lungs are reasonably well inflated and clear. The cardiac silhouette is mildly enlarged though stable. A external pacing -defibrillator pads are present. The pulmonary vascularity is not engorged. The endotracheal tube tip lies 3.9 cm above  the crotch of the carina. The esophagogastric tube tip lies in the gastric cardia. The proximal port likely lies at or above the GE junction. The right internal jugular venous catheter tip lies in the midportion of the SVC.  IMPRESSION: 1. There is no evidence of pneumonia nor pulmonary edema. 2. The endotracheal tube and internal jugular catheter are in appropriate position. Advancement of the nasogastric tube by 5-10 cm is recommended to assure that the proximal port is below the GE junction.   Electronically Signed   By: David  Martinique   On: 08/17/2014 07:25   Dg Chest Port 1 View  08/16/2014   CLINICAL DATA:  line placement  EXAM: PORTABLE CHEST - 1 VIEW  COMPARISON:  08/16/2014  FINDINGS: Right IJ catheter tip projects over the mid SVC. Endotracheal tube tip projects 3.7 cm proximal to the carina. NG tube descends below the level of the image. Other wires and tubes are presumably external. Left chest external support devices obscure detailed evaluation. Heart size enlarged. Mediastinal contours otherwise within normal range. No definite consolidation, pleural effusion, or pneumothorax. Bilateral acromioclavicular DJD. No interval osseous change.  IMPRESSION: Support devices are appropriately positioned as above. No pneumothorax.  Mild cardiac enlargement.  No focal consolidation   Electronically Signed   By: Carlos Levering M.D.   On: 08/16/2014 23:03   Dg Chest Portable 1 View  08/16/2014   CLINICAL DATA:  Acute respiratory failure.  Atrial fibrillation.  EXAM: PORTABLE CHEST - 1 VIEW  COMPARISON:  02/14/2014  FINDINGS: Endotracheal tube is seen with tip approximately 6 cm above the carina.  Both lungs are clear. No evidence of pleural effusion. Mild cardiomegaly is stable.  IMPRESSION: Endotracheal tube in satisfactory position. No acute lung disease. Stable mild cardiomegaly.   Electronically Signed   By: Earle Gell M.D.   On: 08/16/2014 19:36   Dg Abd Portable 1v  08/21/2014   CLINICAL DATA:  Feeding  tube placement.  EXAM: PORTABLE ABDOMEN - 1 VIEW  COMPARISON:  Single view of the abdomen 08/19/2014.  FINDINGS: Feeding tube is in place with the tip projecting inferiorly along the midbody of the stomach.  IMPRESSION: As above.   Electronically Signed   By: Inge Rise M.D.   On: 08/21/2014 12:53   Dg Abd Portable  1v  08/20/2014   CLINICAL DATA:  Feeding tube placement  EXAM: PORTABLE ABDOMEN - 1 VIEW  COMPARISON:  CT abdomen pelvis dated 06/14/2009  FINDINGS: Enteric tube terminates in the distal gastric antrum.  Nonspecific bowel gas pattern, but without disproportionate small bowel dilatation to suggest small bowel obstruction.  Degenerative changes of the lumbar spine.  IMPRESSION: Enteric tube terminates in the distal gastric antrum.   Electronically Signed   By: Julian Hy M.D.   On: 08/20/2014 00:36    CBC  Recent Labs Lab 08/21/14 0300 08/25/14 0430  WBC 11.7* 9.8  HGB 12.4* 12.6*  HCT 37.3* 38.7*  PLT 128* 183  MCV 82.9 83.6  MCH 27.6 27.2  MCHC 33.2 32.6  RDW 18.1* 17.8*    Chemistries   Recent Labs Lab 08/21/14 0300 08/22/14 0635 08/23/14 0650 08/24/14 0312 08/25/14 0430 08/26/14 0435  NA 143 148* 149* 151* 148* 142  K 4.9 3.6* 3.7 3.7 3.8 3.8  CL 107 109 110 112 110 104  CO2 25 29 30 29 29 29   GLUCOSE 93 133* 129* 124* 120* 113*  BUN 14 19 24* 24* 22 20  CREATININE 0.77 0.81 0.93 0.84 0.81 0.78  CALCIUM 8.1* 8.8 8.4 8.4 8.7 8.7  MG 2.1 1.9 2.1  --   --   --   AST  --   --   --   --  125*  --   ALT  --   --   --   --  147*  --   ALKPHOS  --   --   --   --  53  --   BILITOT  --   --   --   --  0.6  --    ------------------------------------------------------------------------------------------------------------------ estimated creatinine clearance is 103.7 ml/min (by C-G formula based on Cr of 0.78). ------------------------------------------------------------------------------------------------------------------ No results found for this  basename: HGBA1C,  in the last 72 hours ------------------------------------------------------------------------------------------------------------------ No results found for this basename: CHOL, HDL, LDLCALC, TRIG, CHOLHDL, LDLDIRECT,  in the last 72 hours ------------------------------------------------------------------------------------------------------------------ No results found for this basename: TSH, T4TOTAL, FREET3, T3FREE, THYROIDAB,  in the last 72 hours ------------------------------------------------------------------------------------------------------------------ No results found for this basename: VITAMINB12, FOLATE, FERRITIN, TIBC, IRON, RETICCTPCT,  in the last 72 hours  Coagulation profile No results found for this basename: INR, PROTIME,  in the last 168 hours  No results found for this basename: DDIMER,  in the last 72 hours  Cardiac Enzymes No results found for this basename: CK, CKMB, TROPONINI, MYOGLOBIN,  in the last 168 hours ------------------------------------------------------------------------------------------------------------------ No components found with this basename: POCBNP,     Waneda Klammer D.O. on 08/27/2014 at 1:09 PM  Between 7am to 7pm - Pager - [phone]  After 7pm go to [email or web address] - password [email or web address]  And look for the night coverage person covering for me after hours  [facility] Office  [phone]

## 2014-08-28 ENCOUNTER — Inpatient Hospital Stay (HOSPITAL_COMMUNITY): Payer: Medicare Other

## 2014-08-28 LAB — GLUCOSE, CAPILLARY
GLUCOSE-CAPILLARY: 106 mg/dL — AB (ref 70–99)
GLUCOSE-CAPILLARY: 134 mg/dL — AB (ref 70–99)
Glucose-Capillary: 118 mg/dL — ABNORMAL HIGH (ref 70–99)
Glucose-Capillary: 111 mg/dL — ABNORMAL HIGH (ref 70–99)
Glucose-Capillary: 125 mg/dL — ABNORMAL HIGH (ref 70–99)
Glucose-Capillary: 126 mg/dL — ABNORMAL HIGH (ref 70–99)

## 2014-08-28 MED ORDER — SENNA 8.6 MG PO TABS
1.0000 | ORAL_TABLET | Freq: Every day | ORAL | Status: DC
  Administered 2014-08-28 – 2014-08-30 (×3): 8.6 mg via ORAL
  Filled 2014-08-28 (×3): qty 1

## 2014-08-28 MED ORDER — BISACODYL 10 MG RE SUPP
10.0000 mg | Freq: Every day | RECTAL | Status: DC | PRN
Start: 2014-08-28 — End: 2014-08-31

## 2014-08-28 MED ORDER — METOPROLOL TARTRATE 25 MG PO TABS
25.0000 mg | ORAL_TABLET | Freq: Two times a day (BID) | ORAL | Status: DC
  Administered 2014-08-28 – 2014-08-31 (×6): 25 mg via ORAL
  Filled 2014-08-28 (×6): qty 1

## 2014-08-28 MED ORDER — ASPIRIN 325 MG PO TABS
325.0000 mg | ORAL_TABLET | Freq: Every day | ORAL | Status: DC
  Administered 2014-08-29 – 2014-08-31 (×3): 325 mg via ORAL
  Filled 2014-08-28 (×3): qty 1

## 2014-08-28 NOTE — Progress Notes (Signed)
Palliative Medicine Team Progress Note  Marquise is more alert and able to sustain his attention better. He is unable to consistently and intelligibly answer questions but does follow specific commands like turn head- move eyes etc.. 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 It is remarkable that he is able tio swallow and has done very well according to nursing with a D1 Diet-he also seems to be maintaining his respirations and clearing his secretions. His temp trend however is up-his Tmax has been just over 100 F. Last CXR was 3 days ago and was clear.    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 I have recommended obtaining an interval MRI to assess his cord again- was unclear on scan if this was infarct, hematoma, or edema-some clarity on this may be helpful for prognostication and planning.    Prior to discharge we need to solidify his medical goals of care - his brother is available by phone and I have contacted Janice-but gotten no return call or answer although she has been in contact with other providers.  1. Need to determine goals of care prior to discharge- ie. Code status, Re-Hospitalization, comfort feeding/feeding tube  2. D/C's Panda- tolerating D1 diet very well  3. Chaplain consult  4. Repeat MRI  5. Fever trend is up, check CXR- very high risk for aspiration  6. Needs daily PROM at minimum- I discussed his care with PT who made recs for splints and assit devices.  7. He is probably looking at SNF for the rest of his life. ?CIR probably not possible without better support system.  Will continue to follow- his brother Letitia Libra has called hospital several times asking for an update- will speak with [name] who is local and update DSS social workers.  Will follow.  Lane Hacker, DO Palliative Medicine

## 2014-08-28 NOTE — Progress Notes (Signed)
Speech Language Pathology Treatment: Dysphagia  Patient Details Name: Anthony Mcclure MRN: [id number] DOB: [birth year] Today's Date: 08/28/2014 Time: 6195-0932 SLP Time Calculation (min): 16 min  Assessment / Plan / Recommendation Clinical Impression  SLP observed pt with dysphagia 1 (puree) diet and honey thick liquid. With SLP feeding pt and cueing verbally to clear residuals with sips after bites, he finished an entire meal with no signs of aspiration. He seems to be tolerating diet well, consider texture upgrade with trials of nectar thick liquids as medical status improves.    HPI HPI: [name] brought to ED on 9/3 after he suffered an out of hospital cardiac arrest. No CPR was performed until 10-15 minutes later when EMS arrived. In ED, pt was comatose and was intubated for respiratory failure, hypothermia protocol initiated. Following intubation, he became hypotensive. MRI brain/C Spine: abn signal to cervical cord C3-4 ? Possible cord infarct or edema . Brain neg for acute process.  EEG is consistent with a generalized nonspecific cerebral dysfunction (encephalopathy). Pt intubated from 9/3-9/7.    Pertinent Vitals Pain Assessment: No/denies pain  SLP Plan       Recommendations Diet recommendations: Honey-thick liquid;Dysphagia 1 (puree) Liquids provided via: Teaspoon Medication Administration: Crushed with puree Supervision: Trained caregiver to feed patient Compensations: Check for pocketing;Follow solids with liquid Postural Changes and/or Swallow Maneuvers: Seated upright 90 degrees;Upright 30-60 min after meal              Oral Care Recommendations: Oral care BID Follow up Recommendations: Inpatient Rehab    GO     Eden Emms 08/28/2014, 1:21 PM  Supervised and reviewed by Herbie Baltimore MA CCC-SLP

## 2014-08-28 NOTE — Progress Notes (Signed)
Physical Therapy Treatment Patient Details Name: Anthony Mcclure MRN: [id number] DOB: 10-20-1951 Today's Date: 08/28/2014    History of Present Illness 63 yo adm 08/16/14 s/p cardiac arrest with 15-30 minute prior to return of spontaneous circulation (ROSC). Pt intubated and underwent hypothermia protocol. 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 MRI c-spine showed abnormal signal in the cord from the craniocervical junction through C3-4 level.  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 PMHx-CHF, hepatitis C, ETOH abuse, substance abuse, seizures, forearm reconstruction    PT Comments    No voluntary or non reflexive movement noted.  Sat EOB working on truncal activation, sitting tolerance and cervical extension/movement.  Expect slow return and progress.  Pt not answering yes/no question with cuing reliably.   Follow Up Recommendations  SNF (u)     Equipment Recommendations   (TBD)    Recommendations for Other Services       Precautions / Restrictions Precautions Precautions: Fall Precaution Comments: monitor HR, watch BP, reposition if demonstrates autonomic dysreflexia Restrictions Weight Bearing Restrictions: No    Mobility  Bed Mobility Overal bed mobility: Needs Assistance Bed Mobility: Supine to Sit;Sit to Supine Rolling: Total assist;+2 for physical assistance;+2 for safety/equipment   Supine to sit: Total assist;+2 for physical assistance;+2 for safety/equipment Sit to supine: Total assist;+2 for safety/equipment;+2 for physical assistance   General bed mobility comments: Pt with total dependence on all mobility.  No non reflexive or voluntary movement noted during mobility.  Transfers Overall transfer level: Needs assistance Equipment used: None                Ambulation/Gait                 Stairs            Wheelchair Mobility    Modified Rankin (Stroke Patients Only)       Balance Overall balance assessment: Needs  assistance Sitting-balance support: Feet supported Sitting balance-Leahy Scale: Zero Sitting balance - Comments: Sat EOB x20 minutes working on activation of trunk/neck or extremities.  Side propping on each elbow too unstable at shoulder without total stability assist.                            Cognition Arousal/Alertness: Awake/alert Behavior During Therapy: WFL for tasks assessed/performed Overall Cognitive Status: Difficult to assess                      Exercises      General Comments General comments (skin integrity, edema, etc.): Overlay helping maintain skin acceptably, BP supine mid 120's/70's and sitting BP's trending down at 105/50's;  Dr. Hilma Favors asked to order soft touch call bell , foot positioning boots, thigh high TED's/abd binder      Pertinent Vitals/Pain Pain Assessment: Faces Faces Pain Scale: Hurts a little bit Pain Location: neck    Home Living                      Prior Function            PT Goals (current goals can now be found in the care plan section) Acute Rehab PT Goals Patient Stated Goal: uanble to state/communicate PT Goal Formulation: Patient unable to participate in goal setting Time For Goal Achievement: 09/04/14 Potential to Achieve Goals: Good Progress towards PT goals: Progressing toward goals    Frequency  Min 3X/week    PT Plan Current  plan remains appropriate    Co-evaluation             End of Session   Activity Tolerance: Patient tolerated treatment well Patient left: in bed;with call bell/phone within reach     Time: 1545-1630 PT Time Calculation (min): 45 min  Charges:  $Therapeutic Activity: 38-52 mins                    G Codes:      Anthony Mcclure, Tessie Fass 08/28/2014, 5:06 PM 08/28/2014  Anthony Mcclure, PT [phone]  (pager)

## 2014-08-28 NOTE — Clinical Social Work Note (Signed)
CSW provided pt's cousin (Janice)with update regarding bed offers. CSW informed [name] that [facility] has offered beds to the pt. [name] reported that she will contact the SNFs for tour.   Hannah, MSW, [place]

## 2014-08-28 NOTE — Progress Notes (Signed)
[facility]                                                                              Patient Demographics  Anthony Mcclure, is a 63 y.o. male, DOB - [birth year], ZOX:[id number]  Admit date - 08/16/2014   Admitting Physician Chesley Mires, MD  Outpatient Primary MD for the patient is [name], MD  LOS - 12   Chief Complaint  Patient presents with  . Cardiac Arrest      HPI/Interim history [name] brought to ED on 9/3 after he suffered an out of hospital cardiac arrest. Arrest was witnessed; however, no CPR was performed until 10-15 minutes later when EMS arrived. Upon their arrival, rhythm was noted to be PEA. ACLS was performed for 15 minutes before ROSC. During resuscitation, pt received 2 of epi and 1amp D50. In ED, pt was comatose and was intubated for respiratory failure. PCCM was consulted for admission and initiation of hypothermia protocol.   Postarrest EKG and enzymes were negative and not consistent with ischemic etiology to the arrest. Patient eventually stabilized and hypothermia was reversed and patient was able to be extubated. After regaining consciousness patient was unable to move his extremities. MRI of his brain was unremarkable no acute normality. MRI of the cervical spine showed abnormal increased T2 signal in the cervical cord extending from the craniocervical junction through the C3/4 level possibly from cord infarction/cord edema or chronic myelomalacia. Neurology was consulted. Patient underwent EEG on 9/5 that demonstrated generalized slowing consistent with encephalopathic state but otherwise was unremarkable.   Dr. Vertell Limber in neurosurgery who reviewed the images and stated he did not see any active compression and therefore did not feel there was any role for acute surgery or steroids. Neurology was also concerned about his mental status and documented there may be some sort of hypoxic brain injury there as well.   Patient has been evaluated by OT, PT  and speech therapy. He currently is at high-risk for oral diet so is n.p.o. and has a feeding tube in place. Speech therapy plans fees study. He was evaluated by CIR but was deemed not an appropriate candidate for inpatient stay at this time. Recommendation is to either pursue skilled nursing facility or LTAC. [facility] has taken over care of this patient as of 08/24/14.  Patient is not a candidate for LTAC.  Palliative care has been consulted, meeting with patient's cousin for 3pm on 08/27/2014.  No decision has been made regarding possible peg tube.  Pending further recommendations from palliative care.  Dysphagia 1 diet started, however, panda tube still place as patient may not be able to obtain enough nutrition PO. May possibly want to reimage spine, however, pending further discussion with cousin.  Assessment & Plan   Cardiac arrest/PEA  -Based on cardiac enzymes and EKG does not appear to be ischemic in etiology-unclear what precipitated the initial event   Acute respiratory failure with hypoxia/post arrest pulmonary edema/MRSA pneumonia  -Repeat CXR negative. Lungs CTA. -Vancomycin discontinued  Dysphagia  -Continue TF. Will likely need PEG. PMT following. 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 Continue free water   Alcohol abuse/ Hepatitis C   Hypertension -Stable, continue metoprolol  Protein calorie malnutrition  -Currently feeding TF through NG tube  Urinary retention: continue foley  Code Status: Full  Family Communication: None at bedside.  Disposition Plan: Admitted.  Palliative care meeting at 3pm today.  Time Spent in minutes   30 minutes  Procedures/Studies/Events 9/3 Admitted with cardiac arrest, hypothermia started  9/3  CT Head / C-Spine >>> nad  9/4 TTE >>> EF 55%  9/5 Rewarming finished  9/5 EEG >>> nonspecific cerebral dsyfxn, no seizures  9/6 New finding of quadriparesis, MRI brain / C-spine ordered  9/7 MRI brain/C Spine: abn signal to cervical cord C3-4 ? Possible cord infarct or edema . Brain neg for acute process.  9/7 Passed SBT. Strong cough when suctioned. Extubated.  9/14 Palliative care meeting 9/15 started on dysphagia 1 diet  Consults   PCCM Neurology Palliative Care CIR  DVT Prophylaxis  Heparin  Lab Results  Component Value Date   PLT 183 08/25/2014    Medications  Scheduled Meds: . antiseptic oral rinse  7 mL Mouth Rinse q12n4p  . aspirin  325 mg Per Tube Daily  . chlorhexidine  15 mL Mouth Rinse BID  . enoxaparin (LOVENOX) injection  30 mg Subcutaneous Q24H  . free water  200 mL Per Tube Q6H  . metoprolol tartrate  25 mg Per Tube BID   Continuous Infusions: . feeding supplement (VITAL AF 1.2 CAL) 1,000 mL (08/27/14 0235)   PRN Meds:.acetaminophen, ALPRAZolam, fentaNYL, pneumococcal 23 valent vaccine  Antibiotics    Anti-infectives   Start     Dose/Rate Route Frequency Ordered Stop   08/23/14 1100  vancomycin (VANCOCIN) 1,250 mg in sodium chloride 0.9 % 250 mL IVPB  Status:  Discontinued     1,250 mg 166.7 mL/hr over 90 Minutes Intravenous Every 8 hours 08/23/14 1036 08/26/14 1436   08/22/14 0030  vancomycin (VANCOCIN) IVPB 1000 mg/200 mL premix  Status:  Discontinued     1,000 mg 200 mL/hr over 60 Minutes Intravenous Every 8 hours 08/21/14 1559 08/23/14 1036   08/21/14 1630  vancomycin (VANCOCIN) 1,750 mg in sodium chloride 0.9 % 500 mL IVPB     1,750 mg 250 mL/hr over 120 Minutes Intravenous  Once 08/21/14 1559 08/21/14 1838   08/19/14 1200  Ampicillin-Sulbactam (UNASYN) 3 g in sodium chloride 0.9 % 100 mL IVPB  Status:  Discontinued     3 g 100 mL/hr over 60 Minutes Intravenous Every 6 hours 08/19/14 1140 08/22/14 1027        Subjective:   Anthony Mcclure  Anthony Mcclure seen and examined today.  Patient able to answer simple questions and can mouth answers.  Denies pain, chest pian, shortness of breath, abdominal pain.   Objective:   Filed Vitals:   08/27/14 1829 08/27/14 2018 08/28/14 0158 08/28/14 0558  BP: 103/52 122/60 117/66 133/85  Pulse: 77 76 67 69  Temp: 100.9 F (38.3 C) 100.2 F (37.9 C) 99 F (37.2 C) 100 F (37.8 C)  TempSrc: Oral Oral Oral Oral  Resp: 20 18 18 18   Height:      Weight:    94.6 kg (208 lb 8.9 oz)  SpO2: 94% 97% 98% 93%    Wt Readings from Last 3 Encounters:  08/28/14 94.6 kg (208 lb 8.9 oz)  04/20/14 92.443 kg (203 lb 12.8 oz)  02/19/14 91.264 kg (201 lb 3.2 oz)     Intake/Output Summary (Last 24 hours) at 08/28/14 1032 Last data filed at 08/28/14 0900  Gross per 24 hour  Intake 1784.67 ml  Output   1400 ml  Net 384.67 ml    Exam  General: Well developed, well nourished, NAD, appears stated age  44: NCAT,  mucous membranes moist. NGTube in place, poor dentition  Cardiovascular: S1 S2 auscultated, no rubs, murmurs or gallops. Regular rate and rhythm.  Respiratory: Clear to auscultation bilaterally with equal chest rise  Abdomen: Soft, nontender, nondistended, + bowel sounds  Extremities: warm dry without cyanosis clubbing  Neurology: Awake, alert, unable to assess orientation.  Can answer questions and follow some commands.  Can move LLE some.   Data Review   Micro Results Recent Results (from the past 240 hour(s))  CULTURE, RESPIRATORY (NON-EXPECTORATED)     Status: None   Collection Time    08/19/14 11:15 AM      Result Value Ref Range Status   Specimen Description TRACHEAL ASPIRATE   Final   Special Requests NONE   Final   Gram Stain     Final   Value: ABUNDANT WBC PRESENT,BOTH PMN AND MONONUCLEAR     NO SQUAMOUS EPITHELIAL CELLS SEEN     MODERATE GRAM POSITIVE COCCI     IN CLUSTERS     Performed at [facility]   Culture     Final   Value: ABUNDANT METHICILLIN  RESISTANT STAPHYLOCOCCUS AUREUS     Note: RIFAMPIN AND GENTAMICIN SHOULD NOT BE USED AS SINGLE DRUGS FOR TREATMENT OF STAPH INFECTIONS.     Performed at [facility]   Report Status 08/22/2014 FINAL   Final   Organism ID, Bacteria METHICILLIN RESISTANT STAPHYLOCOCCUS AUREUS   Final  CULTURE, BLOOD (ROUTINE X 2)     Status: None   Collection Time    08/19/14 11:25 AM      Result Value Ref Range Status   Specimen Description BLOOD LEFT ARM   Final   Special Requests BOTTLES DRAWN AEROBIC AND ANAEROBIC 5 CC   Final   Culture  Setup Time     Final   Value: 08/19/2014 19:17     Performed at [facility]   Culture     Final   Value: NO GROWTH 5 DAYS     Performed at [facility]   Report Status 08/25/2014 FINAL   Final  CULTURE, BLOOD (ROUTINE X 2)     Status: None   Collection Time    08/19/14 11:30 AM      Result Value Ref Range Status   Specimen Description BLOOD LEFT ARM   Final   Special Requests BOTTLES DRAWN AEROBIC AND ANAEROBIC 5 CC    Final   Culture  Setup Time     Final   Value: 08/19/2014 19:17     Performed at [facility]   Culture     Final   Value: NO GROWTH 5 DAYS     Performed at [facility]   Report Status 08/25/2014 FINAL   Final  CLOSTRIDIUM DIFFICILE BY PCR     Status: None   Collection Time    08/23/14  1:30 PM      Result Value Ref Range Status   C difficile by pcr NEGATIVE  NEGATIVE Final  URINE CULTURE     Status: None   Collection  Time    08/25/14  4:45 AM      Result Value Ref Range Status   Specimen Description URINE, CATHETERIZED   Final   Special Requests NONE   Final   Culture  Setup Time     Final   Value: 08/25/2014 16:02     Performed at [place]     Final   Value: NO GROWTH     Performed at [facility]   Culture     Final   Value: NO GROWTH     Performed at [facility]   Report Status 08/26/2014 FINAL   Final    Radiology Reports Ct Head Wo  Contrast  08/16/2014   CLINICAL DATA:  CPR  EXAM: CT HEAD WITHOUT CONTRAST  CT MAXILLOFACIAL WITHOUT CONTRAST  TECHNIQUE: Multidetector CT imaging of the head and cervical spine structures were performed using the standard protocol without intravenous contrast. Multiplanar CT image reconstructions of the maxillofacial structures were also generated.  COMPARISON:  10/24/2010 and 06/14/2009  FINDINGS: CT HEAD FINDINGS  Stable left frontal soft tissue lipoma over the periosteum.  Global atrophy and chronic ischemic changes are stable. No mass effect, midline shift, or acute intracranial hemorrhage. Mastoid air cells are clear. Minimal mucosal thickening in the ethmoid air cells. Old fracture of the medial wall of the left orbit.  CT CERVICAL SPINE FINDINGS  No acute fracture. No dislocation. 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 There is multilevel spinal stenosis. No obvious spinal hematoma. No obvious soft tissue injury. Endotracheal tube is in place.  IMPRESSION: No acute intracranial pathology.  No evidence of acute cervical spine injury. Advanced degenerative changes are noted.   Electronically Signed   By: Maryclare Bean M.D.   On: 08/16/2014 19:59   Ct Cervical Spine Wo Contrast  08/16/2014   CLINICAL DATA:  CPR  EXAM: CT HEAD WITHOUT CONTRAST  CT MAXILLOFACIAL WITHOUT CONTRAST  TECHNIQUE: Multidetector CT imaging of the head and cervical spine structures were performed using the standard protocol without intravenous contrast. Multiplanar CT image reconstructions of the maxillofacial structures were also generated.  COMPARISON:  10/24/2010 and 06/14/2009  FINDINGS: CT HEAD FINDINGS  Stable left frontal soft tissue lipoma over the periosteum.  Global atrophy and chronic ischemic changes are stable. No mass effect, midline shift, or acute intracranial hemorrhage. Mastoid air cells are clear. Minimal mucosal thickening in the ethmoid air cells. Old fracture of the medial wall of  the left orbit.  CT CERVICAL SPINE FINDINGS  No acute fracture. No dislocation. 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 There is multilevel spinal stenosis. No obvious spinal hematoma. No obvious soft tissue injury. Endotracheal tube is in place.  IMPRESSION: No acute intracranial pathology.  No evidence of acute cervical spine injury. Advanced degenerative changes are noted.   Electronically Signed   By: Maryclare Bean M.D.   On: 08/16/2014 19:59   Mr Jeri Cos ZD Contrast  08/19/2014   CLINICAL DATA:  63 year old hypertensive male with history of alcohol abuse, alcohol withdrawal seizures, hepatitis and atrial flutter. Found down and received CPR. Unable to move upper extremities post CPR. Old head injury.  EXAM: MRI HEAD WITHOUT AND WITH CONTRAST  TECHNIQUE: Multiplanar, multiecho pulse sequences of the brain and surrounding structures were obtained without and with intravenous contrast.  CONTRAST:  20 cc MultiHance.  COMPARISON:  MR cervical spine performed the same date and dictated separately.  Head CT 08/16/2014.  No comparison brain MR.  FINDINGS: Some the sequences are motion degraded.  No acute infarct  No intracranial hemorrhage.  Global atrophy. Ventricular size may be related to atrophy rather than hydrocephalus.  No intracranial mass or abnormal enhancement.  No MR evidence of Wernicke's encephalopathy.  Major intracranial vascular structures are patent.  Abnormal appearance of the upper cervical spine better delineated on recent cervical spine MR. Please see report from such. An additional consideration is that the patient had a traumatic event (such as a fall) which caused cord contusion contributing to patient's inability to move extremities. Subacute combined degeneration/ vitamin D deficiency contributing to the cervical cord changes although possible given the patient's history of alcohol use is felt unlikely as cause of the current findings.  Prominent pannus.   This contributes to baseline spinal stenosis.  Left frontal scalp lipoma.  IMPRESSION: Some the sequences are motion degraded.  No acute infarct  No intracranial hemorrhage.  Global atrophy. Ventricular size may be related to atrophy rather than hydrocephalus.  No intracranial mass or abnormal enhancement.  No MR evidence of Wernicke's encephalopathy.  Abnormal appearance of the upper cervical spine better delineated on recent cervical spine MR. Please see report from such. An additional consideration is that the patient had a traumatic event (such as a fall) which caused cord contusion contributing to patient's inability to move extremities.   Electronically Signed   By: Chauncey Cruel M.D.   On: 08/19/2014 17:56   Mr Cervical Spine W Wo Contrast  08/19/2014   CLINICAL DATA:  Found down hypothermic, received CPR. Pneumonia. Not moving upper extremities. Ventilator. Sedated.  EXAM: MRI CERVICAL SPINE WITHOUT AND WITH CONTRAST  TECHNIQUE: Multiplanar and multiecho pulse sequences of the cervical spine, to include the craniocervical junction and cervicothoracic junction, were obtained according to standard protocol without and with intravenous contrast.  CONTRAST:  98mL MULTIHANCE GADOBENATE DIMEGLUMINE 529 MG/ML IV SOLN  COMPARISON:  08/16/2014  FINDINGS: Despite efforts by the technologist and patient, motion artifact is present on today's exam and could not be eliminated. This reduces exam sensitivity and specificity.  Chronic degenerative findings and pannus posterior to the odontoid with likely chronic erosion of the odontoid, causing exaggerated curvature of the thecal sac at the craniocervical junction. Low-grade enhancement surrounds this pannus. Abnormal but low grade osseous edema noted anteriorly in C1 and C2.  The patient is orally intubated.  Abnormal increased T2 signal in the cord extends from the skull base through the C3-4 level, especially centrally, and most severely at the C1 vertebral level. No  associated enhancement in the cord to suggest that this is due to high-grade tumor. Gradient images are severely limited by motion artifact.  There is reversal of the normal cervical lordosis with loss of intervertebral disc height at C6-7 and C7-T 1. Multilevel degenerative endplate findings especially at C7-T1. Diffuse low-grade third spacing of fluid in the neck.  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  Additional findings at individual levels are as follows:  C1-2: Pannus and exaggerated curvature at the cervicothoracic junction cause moderate central narrowing of the thecal sac. Spurring from C1 causes at least mild bilateral foraminal stenosis, and there is a posterior fusion of the left C1-2 articulation.  C2-3: Prominent left and moderate right foraminal stenosis with moderate central narrowing of the thecal sac due to posterior osseous ridging, disc bulge, and facet arthropathy.  C3-4: Prominent left and moderate right foraminal stenosis with mild central narrowing of  the thecal sac due to uncinate spurring facet arthropathy, and disc bulge.  C4-5: Moderate right and mild left foraminal stenosis due to uncinate and facet spurring.  C5-6: Moderate left foraminal stenosis due to facet arthropathy and uncinate spurring.  C6-7: Moderate bilateral foraminal stenosis due to uncinate and facet spurring along with mild disc bulge.  C7-T1: Prominent bilateral foraminal stenosis due to posterior osseous ridging, uncinate spurring, and facet arthropathy.  IMPRESSION: 1. Abnormal signal in the cervical cord extending from the craniocervical junction through the C3-4 level, possibly from cord infarct, cord edema, or chronic myelomalacia. No associated cord enhancement to suggest high-grade tumor. 2. Chronic degenerative findings at C1-2 and C2-3 with considerable pannus posterior to the odontoid, causing exaggerated curvature of the cord and central narrowing of the thecal sac. 3. Cervical  spondylosis and degenerative disc disease is also present. Overall associated impingement is graded as prominent at C2-3, C3-4, and C7-T1; and moderate at C1-2, C4-5, C5-6, and C6-7, as detailed above.   Electronically Signed   By: Sherryl Barters M.D.   On: 08/19/2014 16:55   Dg Chest Port 1 View  08/25/2014   CLINICAL DATA:  Dyspnea  EXAM: PORTABLE CHEST - 1 VIEW  COMPARISON:  08/22/2014  FINDINGS: Mild to moderate cardiac enlargement stable. Feeding tube crosses the gastroesophageal junction and right internal jugular central line also in unchanged position.  Vascular pattern is normal. No consolidation or effusion. Mild residual left lower lobe atelectasis.  IMPRESSION: No active disease.   Electronically Signed   By: Skipper Cliche M.D.   On: 08/25/2014 12:26   Dg Chest Port 1 View  08/22/2014   CLINICAL DATA:  Shortness of breath.  Evaluate atelectasis  EXAM: PORTABLE CHEST - 1 VIEW  COMPARISON:  08/21/2014; 08/20/2014; 08/19/2014  FINDINGS: Grossly unchanged enlarged cardiac silhouette. Bilateral infrahilar heterogeneous opacities are unchanged. No new focal airspace opacities. No evidence of edema. No pleural effusion. A skin fold overlies the right upper lobe. No pleural effusion. Unchanged bones.  IMPRESSION: 1. Grossly unchanged bilateral infrahilar heterogeneous opacities, likely atelectasis. 2. A skin fold overlies the right upper lung. No definite pneumothorax. Further evaluation could be performed with PA and lateral chest radiograph as clinically indicated.   Electronically Signed   By: Sandi Mariscal M.D.   On: 08/22/2014 07:47   Dg Chest Port 1 View  08/21/2014   CLINICAL DATA:  Extubated patient.  EXAM: PORTABLE CHEST - 1 VIEW  COMPARISON:  08/20/2014  FINDINGS: Since the prior study, the endotracheal tube and nasogastric tube have been removed. The right internal jugular central venous catheter remains unchanged.  Right lung base opacity has improved. No new areas of lung opacity. No  pulmonary edema. No pleural effusion or pneumothorax on this semi-erect study.  Cardiac silhouette is normal in size. No mediastinal or hilar masses.  IMPRESSION: 1. Improved right lung base opacity consistent with an improved infiltrate or improved atelectasis. 2. Status post extubation and removal of the nasogastric tube.   Electronically Signed   By: Lajean Manes M.D.   On: 08/21/2014 07:37   Dg Chest Port 1 View  08/20/2014   CLINICAL DATA:  Acute respiratory failure. On ventilator. Status post cardiac arrest.  EXAM: PORTABLE CHEST - 1 VIEW  COMPARISON:  08/19/2014  FINDINGS: Mild cardiomegaly is stable. Increased opacity is seen in the right lower lobe silhouetting the right hemidiaphragm. No evidence of pleural effusion or pneumothorax. Support lines and tubes in appropriate position.  IMPRESSION: Increased right lower lobe  infiltrate.  Stable mild cardiomegaly.   Electronically Signed   By: Earle Gell M.D.   On: 08/20/2014 09:15   Dg Chest Port 1 View  08/19/2014   CLINICAL DATA:  Intubated. Congestive heart failure. Alcohol abuse.  EXAM: PORTABLE CHEST - 1 VIEW  COMPARISON:  One day prior  FINDINGS: Endotracheal tube unchanged with tip 3.5 cm above carina. Nasogastric tube extends beyond the inferior aspect of the film. Right internal jugular line tip mid SVC.  Mild cardiomegaly. No pleural effusion or pneumothorax. Improved to resolved left base airspace disease. Developing right base opacity.  IMPRESSION: Decreased to resolved left base airspace disease. New developing right base airspace disease. Favor shifting atelectasis. Early right-sided infection cannot be excluded.  Cardiomegaly without congestive failure.   Electronically Signed   By: Abigail Miyamoto M.D.   On: 08/19/2014 07:38   Dg Chest Port 1 View  08/18/2014   CLINICAL DATA:  Endotracheal to  EXAM: PORTABLE CHEST - 1 VIEW  COMPARISON:  Yesterday  FINDINGS: Tubular device is stable. No pneumothorax. Normal heart size. Increasing left  basilar airspace disease.  IMPRESSION: Increasing left basilar airspace disease.   Electronically Signed   By: Maryclare Bean M.D.   On: 08/18/2014 07:34   Portable Chest Xray In Am  08/17/2014   CLINICAL DATA:  Reassess support tube placement.  EXAM: PORTABLE CHEST - 1 VIEW  COMPARISON:  Portable chest x-ray of August 16, 2014  FINDINGS: The lungs are reasonably well inflated and clear. The cardiac silhouette is mildly enlarged though stable. A external pacing -defibrillator pads are present. The pulmonary vascularity is not engorged. The endotracheal tube tip lies 3.9 cm above the crotch of the carina. The esophagogastric tube tip lies in the gastric cardia. The proximal port likely lies at or above the GE junction. The right internal jugular venous catheter tip lies in the midportion of the SVC.  IMPRESSION: 1. There is no evidence of pneumonia nor pulmonary edema. 2. The endotracheal tube and internal jugular catheter are in appropriate position. Advancement of the nasogastric tube by 5-10 cm is recommended to assure that the proximal port is below the GE junction.   Electronically Signed   By: David  Martinique   On: 08/17/2014 07:25   Dg Chest Port 1 View  08/16/2014   CLINICAL DATA:  line placement  EXAM: PORTABLE CHEST - 1 VIEW  COMPARISON:  08/16/2014  FINDINGS: Right IJ catheter tip projects over the mid SVC. Endotracheal tube tip projects 3.7 cm proximal to the carina. NG tube descends below the level of the image. Other wires and tubes are presumably external. Left chest external support devices obscure detailed evaluation. Heart size enlarged. Mediastinal contours otherwise within normal range. No definite consolidation, pleural effusion, or pneumothorax. Bilateral acromioclavicular DJD. No interval osseous change.  IMPRESSION: Support devices are appropriately positioned as above. No pneumothorax.  Mild cardiac enlargement.  No focal consolidation   Electronically Signed   By: Carlos Levering M.D.   On:  08/16/2014 23:03   Dg Chest Portable 1 View  08/16/2014   CLINICAL DATA:  Acute respiratory failure.  Atrial fibrillation.  EXAM: PORTABLE CHEST - 1 VIEW  COMPARISON:  02/14/2014  FINDINGS: Endotracheal tube is seen with tip approximately 6 cm above the carina.  Both lungs are clear. No evidence of pleural effusion. Mild cardiomegaly is stable.  IMPRESSION: Endotracheal tube in satisfactory position. No acute lung disease. Stable mild cardiomegaly.   Electronically Signed   By: Earle Gell  M.D.   On: 08/16/2014 19:36   Dg Abd Portable 1v  08/21/2014   CLINICAL DATA:  Feeding tube placement.  EXAM: PORTABLE ABDOMEN - 1 VIEW  COMPARISON:  Single view of the abdomen 08/19/2014.  FINDINGS: Feeding tube is in place with the tip projecting inferiorly along the midbody of the stomach.  IMPRESSION: As above.   Electronically Signed   By: Inge Rise M.D.   On: 08/21/2014 12:53   Dg Abd Portable 1v  08/20/2014   CLINICAL DATA:  Feeding tube placement  EXAM: PORTABLE ABDOMEN - 1 VIEW  COMPARISON:  CT abdomen pelvis dated 06/14/2009  FINDINGS: Enteric tube terminates in the distal gastric antrum.  Nonspecific bowel gas pattern, but without disproportionate small bowel dilatation to suggest small bowel obstruction.  Degenerative changes of the lumbar spine.  IMPRESSION: Enteric tube terminates in the distal gastric antrum.   Electronically Signed   By: Julian Hy M.D.   On: 08/20/2014 00:36    CBC  Recent Labs Lab 08/25/14 0430  WBC 9.8  HGB 12.6*  HCT 38.7*  PLT 183  MCV 83.6  MCH 27.2  MCHC 32.6  RDW 17.8*    Chemistries   Recent Labs Lab 08/22/14 0635 08/23/14 0650 08/24/14 0312 08/25/14 0430 08/26/14 0435  NA 148* 149* 151* 148* 142  K 3.6* 3.7 3.7 3.8 3.8  CL 109 110 112 110 104  CO2 29 30 29 29 29   GLUCOSE 133* 129* 124* 120* 113*  BUN 19 24* 24* 22 20  CREATININE 0.81 0.93 0.84 0.81 0.78  CALCIUM 8.8 8.4 8.4 8.7 8.7  MG 1.9 2.1  --   --   --   AST  --   --   --  125*   --   ALT  --   --   --  147*  --   ALKPHOS  --   --   --  53  --   BILITOT  --   --   --  0.6  --    ------------------------------------------------------------------------------------------------------------------ estimated creatinine clearance is 112.8 ml/min (by C-G formula based on Cr of 0.78). ------------------------------------------------------------------------------------------------------------------ No results found for this basename: HGBA1C,  in the last 72 hours ------------------------------------------------------------------------------------------------------------------ No results found for this basename: CHOL, HDL, LDLCALC, TRIG, CHOLHDL, LDLDIRECT,  in the last 72 hours ------------------------------------------------------------------------------------------------------------------ No results found for this basename: TSH, T4TOTAL, FREET3, T3FREE, THYROIDAB,  in the last 72 hours ------------------------------------------------------------------------------------------------------------------ No results found for this basename: VITAMINB12, FOLATE, FERRITIN, TIBC, IRON, RETICCTPCT,  in the last 72 hours  Coagulation profile No results found for this basename: INR, PROTIME,  in the last 168 hours  No results found for this basename: DDIMER,  in the last 72 hours  Cardiac Enzymes No results found for this basename: CK, CKMB, TROPONINI, MYOGLOBIN,  in the last 168 hours ------------------------------------------------------------------------------------------------------------------ No components found with this basename: POCBNP,     Dora Simeone D.O. on 08/28/2014 at 10:32 AM  Between 7am to 7pm - Pager - [phone]  After 7pm go to [email or web address] - password [email or web address]  And look for the night coverage person covering for me after hours  [facility] Office  [phone]

## 2014-08-29 ENCOUNTER — Inpatient Hospital Stay (HOSPITAL_COMMUNITY): Payer: Medicare Other

## 2014-08-29 DIAGNOSIS — R1310 Dysphagia, unspecified: Secondary | ICD-10-CM

## 2014-08-29 LAB — BASIC METABOLIC PANEL
Anion gap: 9 (ref 5–15)
BUN: 21 mg/dL (ref 6–23)
CO2: 29 mEq/L (ref 19–32)
CREATININE: 0.69 mg/dL (ref 0.50–1.35)
Calcium: 9.1 mg/dL (ref 8.4–10.5)
Chloride: 100 mEq/L (ref 96–112)
Glucose, Bld: 102 mg/dL — ABNORMAL HIGH (ref 70–99)
Potassium: 4.4 mEq/L (ref 3.7–5.3)
Sodium: 138 mEq/L (ref 137–147)

## 2014-08-29 LAB — CBC
HEMATOCRIT: 37.7 % — AB (ref 39.0–52.0)
Hemoglobin: 12.5 g/dL — ABNORMAL LOW (ref 13.0–17.0)
MCH: 27.4 pg (ref 26.0–34.0)
MCHC: 33.2 g/dL (ref 30.0–36.0)
MCV: 82.5 fL (ref 78.0–100.0)
Platelets: 297 10*3/uL (ref 150–400)
RBC: 4.57 MIL/uL (ref 4.22–5.81)
RDW: 17.2 % — ABNORMAL HIGH (ref 11.5–15.5)
WBC: 12.5 10*3/uL — ABNORMAL HIGH (ref 4.0–10.5)

## 2014-08-29 LAB — GLUCOSE, CAPILLARY
Glucose-Capillary: 114 mg/dL — ABNORMAL HIGH (ref 70–99)
Glucose-Capillary: 117 mg/dL — ABNORMAL HIGH (ref 70–99)

## 2014-08-29 MED ORDER — GADOBENATE DIMEGLUMINE 529 MG/ML IV SOLN
15.0000 mL | Freq: Once | INTRAVENOUS | Status: AC | PRN
  Administered 2014-08-29: 20 mL via INTRAVENOUS

## 2014-08-29 NOTE — Progress Notes (Signed)
Occupational Therapy Treatment Patient Details Name: Anthony Mcclure MRN: [id number] DOB: [birth year] Today's Date: 08/29/2014    History of present illness 63 yo adm 08/16/14 s/p cardiac arrest with 15-30 minute prior to return of spontaneous circulation (ROSC). Pt intubated and underwent hypothermia protocol. 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 MRI c-spine showed abnormal signal in the cord from the craniocervical junction through C3-4 level.  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 PMHx-CHF, hepatitis C, ETOH abuse, substance abuse, seizures, forearm reconstruction   OT comments  MD Please order: abdominal binder, ted hose, prafo boots, turn every 2 hours by RN staff and setting of air mattress to be on SOFt Pt currently without voluntary movement and sitting EOB with total +2 (A). Pt continues to demonstrate decr in BP during EOB and could benefit from above equipment to help regulate. Pt provided new SCD end of session. Ot to follow acutely at this time.    Follow Up Recommendations  SNF;Supervision/Assistance - 24 hour (denied LTACH CIR)    Equipment Recommendations  Hospital bed;Wheelchair cushion (measurements OT);Wheelchair (measurements OT);Other (comment) (ted hose, prafo, abdominal binder, lift )    Recommendations for Other Services      Precautions / Restrictions Precautions Precautions: Fall Precaution Comments: monitor HR, watch BP, reposition if demonstrates autonomic dysreflexia       Mobility Bed Mobility Overal bed mobility: Needs Assistance;+2 for physical assistance;+ 2 for safety/equipment Bed Mobility: Supine to Sit;Sit to Supine Rolling: Total assist;+2 for physical assistance Sidelying to sit: Total assist;+2 for physical assistance       General bed mobility comments: total dependence for all mobility  Transfers                      Balance Overall balance assessment: Needs assistance Sitting-balance support: Feet  supported Sitting balance-Leahy Scale: Zero                             ADL Overall ADL's : Needs assistance/impaired   Eating/Feeding Details (indicate cue type and reason): now with diet total (A) for self feeding Grooming: Total assistance   Upper Body Bathing: Total assistance   Lower Body Bathing: Total assistance                         General ADL Comments: Pt with ace wraps applied to bil LE.SCD applied to help with regulation of BP. Pt sitting eob and SCD not working properly. Pt with decr BP and return supine. Session limited by BP. Pt provided PROM of BIL UE and noted ot have bil shoulder subluxation. Tech educated on mattress and how to set mattress of soft setting. pt on firm setting on arrival. RN notified of items therapy recommending. please see above highlighted patient needs      Vision                     Perception     Praxis      Cognition   Behavior During Therapy: [facility] for tasks assessed/performed Overall Cognitive Status: Impaired/Different from baseline Area of Impairment: Memory                General Comments: Pt answering yes to all questions. pt does not use yes no responses accurately    Extremity/Trunk Assessment               Exercises  Shoulder Instructions       General Comments      Pertinent Vitals/ Pain          Home Living                                          Prior Functioning/Environment              Frequency Min 2X/week     Progress Toward Goals  OT Goals(current goals can now be found in the care plan section)  Progress towards OT goals: Progressing toward goals  Acute Rehab OT Goals Patient Stated Goal: uanble to state/communicate OT Goal Formulation: Patient unable to participate in goal setting Time For Goal Achievement: 09/04/14 Potential to Achieve Goals: Fair ADL Goals Additional ADL Goal #1: Pt will tolerate EOB sitting x 7 mins  without evidence of orthosasis in prep for transfers to chair Additional ADL Goal #2: Family will be independent with PROM bil. UEs (no family present during session) Additional ADL Goal #3: Pt will tolerate splint wear without evidence of skin breakdown  (not address at this time due to diaphoretic )  Plan Discharge plan needs to be updated    Co-evaluation                 End of Session     Activity Tolerance Patient tolerated treatment well   Patient Left in bed;with call bell/phone within reach   Nurse Communication Mobility status;Precautions        Time: 3086-5784 OT Time Calculation (min): 17 min  Charges: OT General Charges $OT Visit: 1 Procedure OT Treatments $Therapeutic Activity: 8-22 mins  Parke Poisson B 08/29/2014, 2:37 PM Pager: [phone]

## 2014-08-29 NOTE — Progress Notes (Signed)
[facility]                                                                              Patient Demographics  Anthony Mcclure, is a 63 y.o. male, DOB - 1950/12/25, EYC:[id number]  Admit date - 08/16/2014   Admitting Physician Anthony Mires, MD  Outpatient Primary MD for the patient is Anthony Marek, MD  LOS - 78   Chief Complaint  Patient presents with  . Cardiac Arrest      HPI/Interim history [name] brought to ED on 9/3 after he suffered an out of hospital cardiac arrest. Arrest was witnessed; however, no CPR was performed until 10-15 minutes later when EMS arrived. Upon their arrival, rhythm was noted to be PEA. ACLS was performed for 15 minutes before ROSC. During resuscitation, pt received 2 of epi and 1amp D50. In ED, pt was comatose and was intubated for respiratory failure. PCCM was consulted for admission and initiation of hypothermia protocol.   Postarrest EKG and enzymes were negative and not consistent with ischemic etiology to the arrest. Patient eventually stabilized and hypothermia was reversed and patient was able to be extubated. After regaining consciousness patient was unable to move his extremities. MRI of his brain was unremarkable no acute normality. MRI of the cervical spine showed abnormal increased T2 signal in the cervical cord extending from the craniocervical junction through the C3/4 level possibly from cord infarction/cord edema or chronic myelomalacia. Neurology was consulted. Patient underwent EEG on 9/5 that demonstrated generalized slowing consistent with encephalopathic state but otherwise was unremarkable.   Dr. Vertell Mcclure in neurosurgery who reviewed the images and stated he did not see any active compression and therefore did not feel there was any role for acute surgery or steroids. Neurology was also concerned about his mental status and documented there may be some sort of hypoxic brain injury there as well.   Patient has been evaluated by OT, PT  and speech therapy.  He was evaluated by CIR but was deemed not an appropriate candidate for inpatient stay at this time. Recommendation is to either pursue skilled nursing facility. [facility] has taken over care of this patient as of 08/24/14.  Patient is not a candidate for LTAC.  Palliative care has been consulted, meeting with patient's cousin for 3pm on 08/27/2014.  No decision has been was made regarding possible peg tube.  Pending further recommendations from palliative care.  Dysphagia 1 diet started. panda tube was removed 9-15.    Assessment & Plan   Cardiac arrest/PEA  -Based on cardiac enzymes and EKG does not appear to be ischemic in etiology-unclear what precipitated the initial event   Acute respiratory failure with hypoxia/post arrest pulmonary edema/MRSA pneumonia  -Repeat CXR negative. Lungs CTA. -Vancomycin discontinued  Dysphagia  - Patient was started on dysphagia 1 diet. Per nurse he ate 100 % breakfast.  -will consult nutritionist, for nutrition requirement.  -Guardianship needs to be established for GOC, PEG ... Per SW guardians ship not need, as brother and cousin will be making decision. Palliative to discussed with family Goals of care.   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   Dehydration with hypernatremia  -Resolved. Continue free water   Alcohol abuse/ Hepatitis C   Hypertension -Stable, continue metoprolol  Protein calorie malnutrition  -dysphagia diet. Nutritionist consulted.   Urinary retention: continue foley  Code Status: Full  Family Communication: None at bedside.  Disposition Plan: Admitted.  Palliative care meeting at 3pm today.  Time Spent in minutes   30 minutes  Procedures/Studies/Events 9/3 Admitted with cardiac arrest, hypothermia started  9/3 CT Head / C-Spine >>> nad    9/4 TTE >>> EF 55%  9/5 Rewarming finished  9/5 EEG >>> nonspecific cerebral dsyfxn, no seizures  9/6 New finding of quadriparesis, MRI brain / C-spine ordered  9/7 MRI brain/C Spine: abn signal to cervical cord C3-4 ? Possible cord infarct or edema . Brain neg for acute process.  9/7 Passed SBT. Strong cough when suctioned. Extubated.  9/14 Palliative care meeting 9/15 started on dysphagia 1 diet  Consults   PCCM Neurology Palliative Care CIR  DVT Prophylaxis  Heparin  Lab Results  Component Value Date   PLT 297 08/29/2014    Medications  Scheduled Meds: . antiseptic oral rinse  7 mL Mouth Rinse q12n4p  . aspirin  325 mg Oral Daily  . chlorhexidine  15 mL Mouth Rinse BID  . enoxaparin (LOVENOX) injection  30 mg Subcutaneous Q24H  . metoprolol tartrate  25 mg Oral BID  . senna  1 tablet Oral QHS   Continuous Infusions:   PRN Meds:.acetaminophen, ALPRAZolam, bisacodyl, fentaNYL, pneumococcal 23 valent vaccine  Antibiotics    Anti-infectives   Start     Dose/Rate Route Frequency Ordered Stop   08/23/14 1100  vancomycin (VANCOCIN) 1,250 mg in sodium chloride 0.9 % 250 mL IVPB  Status:  Discontinued     1,250 mg 166.7 mL/hr over 90 Minutes Intravenous Every 8 hours 08/23/14 1036 08/26/14 1436   08/22/14 0030  vancomycin (VANCOCIN) IVPB 1000 mg/200 mL premix  Status:  Discontinued     1,000 mg 200 mL/hr over 60 Minutes Intravenous Every 8 hours 08/21/14 1559 08/23/14 1036   08/21/14 1630  vancomycin (VANCOCIN) 1,750 mg in sodium chloride 0.9 % 500 mL IVPB     1,750 mg 250 mL/hr over 120 Minutes Intravenous  Once 08/21/14 1559 08/21/14 1838   08/19/14 1200  Ampicillin-Sulbactam (UNASYN) 3 g in sodium chloride 0.9 % 100 mL IVPB  Status:  Discontinued     3 g 100 mL/hr over 60 Minutes Intravenous Every 6 hours 08/19/14 1140 08/22/14 1027        Subjective:   Anthony Mcclure XXX Mcclure seen and examined today.  Speech soft, he was able to eat breakfast [er nurse.    Objective:   Filed Vitals:   08/29/14 0146 08/29/14 0519 08/29/14 1057 08/29/14 1543  BP: 115/79 106/70 138/83 129/81  Pulse: 72 79 73 76  Temp: 97.9 F (36.6 C) 97.2 F (36.2 C) 98.1 F (36.7 C) 98.4 F (36.9 C)  TempSrc: Oral Oral Axillary Axillary  Resp: 18 20 20 18   Height:      Weight:  93.849 kg (206 lb 14.4 oz)    SpO2: 97% 97% 99% 98%    Wt Readings from Last 3 Encounters:  08/29/14 93.849 kg (206 lb 14.4 oz)  04/20/14 92.443 kg (203 lb 12.8 oz)  02/19/14 91.264 kg (201 lb 3.2 oz)     Intake/Output Summary (Last 24 hours) at 08/29/14 1715  Last data filed at 08/29/14 1200  Gross per 24 hour  Intake    240 ml  Output    500 ml  Net   -260 ml    Exam  General:  NAD, appears stated age  HEENT: NCAT,  mucous membranes moist. NGTube in place, poor dentition  Cardiovascular: S1 S2 auscultated, no rubs, murmurs or gallops. Regular rate and rhythm.  Respiratory: Clear to auscultation bilaterally with equal chest rise  Abdomen: Soft, nontender, nondistended, + bowel sounds  Extremities: warm dry without cyanosis clubbing  Neurology: Awake, alert, unable to assess orientation.  Can answer questions and follow some commands.  Can move LLE some.   Data Review   Micro Results Recent Results (from the past 240 hour(s))  CLOSTRIDIUM DIFFICILE BY PCR     Status: None   Collection Time    08/23/14  1:30 PM      Result Value Ref Range Status   C difficile by pcr NEGATIVE  NEGATIVE Final  URINE CULTURE     Status: None   Collection Time    08/25/14  4:45 AM      Result Value Ref Range Status   Specimen Description URINE, CATHETERIZED   Final   Special Requests NONE   Final   Culture  Setup Time     Final   Value: 08/25/2014 16:02     Performed at [place]     Final   Value: NO GROWTH     Performed at [facility]   Culture     Final   Value: NO GROWTH     Performed at [facility]   Report Status 08/26/2014  FINAL   Final    Radiology Reports Ct Head Wo Contrast  08/16/2014   CLINICAL DATA:  CPR  EXAM: CT HEAD WITHOUT CONTRAST  CT MAXILLOFACIAL WITHOUT CONTRAST  TECHNIQUE: Multidetector CT imaging of the head and cervical spine structures were performed using the standard protocol without intravenous contrast. Multiplanar CT image reconstructions of the maxillofacial structures were also generated.  COMPARISON:  10/24/2010 and 06/14/2009  FINDINGS: CT HEAD FINDINGS  Stable left frontal soft tissue lipoma over the periosteum.  Global atrophy and chronic ischemic changes are stable. No mass effect, midline shift, or acute intracranial hemorrhage. Mastoid air cells are clear. Minimal mucosal thickening in the ethmoid air cells. Old fracture of the medial wall of the left orbit.  CT CERVICAL SPINE FINDINGS  No acute fracture. No dislocation. 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 There is multilevel spinal stenosis. No obvious spinal hematoma. No obvious soft tissue injury. Endotracheal tube is in place.  IMPRESSION: No acute intracranial pathology.  No evidence of acute cervical spine injury. Advanced degenerative changes are noted.   Electronically Signed   By: Maryclare Bean M.D.   On: 08/16/2014 19:59   Ct Cervical Spine Wo Contrast  08/16/2014   CLINICAL DATA:  CPR  EXAM: CT HEAD WITHOUT CONTRAST  CT MAXILLOFACIAL WITHOUT CONTRAST  TECHNIQUE: Multidetector CT imaging of the head and cervical spine structures were performed using the standard protocol without intravenous contrast. Multiplanar CT image reconstructions of the maxillofacial structures were also generated.  COMPARISON:  10/24/2010 and 06/14/2009  FINDINGS: CT HEAD FINDINGS  Stable left frontal soft tissue lipoma over the periosteum.  Global atrophy and chronic ischemic changes are stable. No mass effect, midline shift, or acute intracranial hemorrhage. Mastoid air cells are clear. Minimal mucosal  thickening in the  ethmoid air cells. Old fracture of the medial wall of the left orbit.  CT CERVICAL SPINE FINDINGS  No acute fracture. No dislocation. 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 There is multilevel spinal stenosis. No obvious spinal hematoma. No obvious soft tissue injury. Endotracheal tube is in place.  IMPRESSION: No acute intracranial pathology.  No evidence of acute cervical spine injury. Advanced degenerative changes are noted.   Electronically Signed   By: Maryclare Bean M.D.   On: 08/16/2014 19:59   Mr Jeri Cos ZO Contrast  08/19/2014   CLINICAL DATA:  63 year old hypertensive male with history of alcohol abuse, alcohol withdrawal seizures, hepatitis and atrial flutter. Found down and received CPR. Unable to move upper extremities post CPR. Old head injury.  EXAM: MRI HEAD WITHOUT AND WITH CONTRAST  TECHNIQUE: Multiplanar, multiecho pulse sequences of the brain and surrounding structures were obtained without and with intravenous contrast.  CONTRAST:  20 cc MultiHance.  COMPARISON:  MR cervical spine performed the same date and dictated separately.  Head CT 08/16/2014.  No comparison brain MR.  FINDINGS: Some the sequences are motion degraded.  No acute infarct  No intracranial hemorrhage.  Global atrophy. Ventricular size may be related to atrophy rather than hydrocephalus.  No intracranial mass or abnormal enhancement.  No MR evidence of Wernicke's encephalopathy.  Major intracranial vascular structures are patent.  Abnormal appearance of the upper cervical spine better delineated on recent cervical spine MR. Please see report from such. An additional consideration is that the patient had a traumatic event (such as a fall) which caused cord contusion contributing to patient's inability to move extremities. Subacute combined degeneration/ vitamin D deficiency contributing to the cervical cord changes although possible given the patient's history of alcohol use is felt unlikely as  cause of the current findings.  Prominent pannus.  This contributes to baseline spinal stenosis.  Left frontal scalp lipoma.  IMPRESSION: Some the sequences are motion degraded.  No acute infarct  No intracranial hemorrhage.  Global atrophy. Ventricular size may be related to atrophy rather than hydrocephalus.  No intracranial mass or abnormal enhancement.  No MR evidence of Wernicke's encephalopathy.  Abnormal appearance of the upper cervical spine better delineated on recent cervical spine MR. Please see report from such. An additional consideration is that the patient had a traumatic event (such as a fall) which caused cord contusion contributing to patient's inability to move extremities.   Electronically Signed   By: Chauncey Cruel M.D.   On: 08/19/2014 17:56   Mr Cervical Spine W Wo Contrast  08/19/2014   CLINICAL DATA:  Found down hypothermic, received CPR. Pneumonia. Not moving upper extremities. Ventilator. Sedated.  EXAM: MRI CERVICAL SPINE WITHOUT AND WITH CONTRAST  TECHNIQUE: Multiplanar and multiecho pulse sequences of the cervical spine, to include the craniocervical junction and cervicothoracic junction, were obtained according to standard protocol without and with intravenous contrast.  CONTRAST:  82mL MULTIHANCE GADOBENATE DIMEGLUMINE 529 MG/ML IV SOLN  COMPARISON:  08/16/2014  FINDINGS: Despite efforts by the technologist and patient, motion artifact is present on today's exam and could not be eliminated. This reduces exam sensitivity and specificity.  Chronic degenerative findings and pannus posterior to the odontoid with likely chronic erosion of the odontoid, causing exaggerated curvature of the thecal sac at the craniocervical junction. Low-grade enhancement surrounds this pannus. Abnormal but low grade osseous edema noted anteriorly in C1 and C2.  The patient is orally intubated.  Abnormal  increased T2 signal in the cord extends from the skull base through the C3-4 level, especially centrally,  and most severely at the C1 vertebral level. No associated enhancement in the cord to suggest that this is due to high-grade tumor. Gradient images are severely limited by motion artifact.  There is reversal of the normal cervical lordosis with loss of intervertebral disc height at C6-7 and C7-T 1. Multilevel degenerative endplate findings especially at C7-T1. Diffuse low-grade third spacing of fluid in the neck.  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  Additional findings at individual levels are as follows:  C1-2: Pannus and exaggerated curvature at the cervicothoracic junction cause moderate central narrowing of the thecal sac. Spurring from C1 causes at least mild bilateral foraminal stenosis, and there is a posterior fusion of the left C1-2 articulation.  C2-3: Prominent left and moderate right foraminal stenosis with moderate central narrowing of the thecal sac due to posterior osseous ridging, disc bulge, and facet arthropathy.  C3-4: Prominent left and moderate right foraminal stenosis with mild central narrowing of the thecal sac due to uncinate spurring facet arthropathy, and disc bulge.  C4-5: Moderate right and mild left foraminal stenosis due to uncinate and facet spurring.  C5-6: Moderate left foraminal stenosis due to facet arthropathy and uncinate spurring.  C6-7: Moderate bilateral foraminal stenosis due to uncinate and facet spurring along with mild disc bulge.  C7-T1: Prominent bilateral foraminal stenosis due to posterior osseous ridging, uncinate spurring, and facet arthropathy.  IMPRESSION: 1. Abnormal signal in the cervical cord extending from the craniocervical junction through the C3-4 level, possibly from cord infarct, cord edema, or chronic myelomalacia. No associated cord enhancement to suggest high-grade tumor. 2. Chronic degenerative findings at C1-2 and C2-3 with considerable pannus posterior to the odontoid, causing exaggerated curvature of the cord and central  narrowing of the thecal sac. 3. Cervical spondylosis and degenerative disc disease is also present. Overall associated impingement is graded as prominent at C2-3, C3-4, and C7-T1; and moderate at C1-2, C4-5, C5-6, and C6-7, as detailed above.   Electronically Signed   By: Sherryl Barters M.D.   On: 08/19/2014 16:55   Dg Chest Port 1 View  08/25/2014   CLINICAL DATA:  Dyspnea  EXAM: PORTABLE CHEST - 1 VIEW  COMPARISON:  08/22/2014  FINDINGS: Mild to moderate cardiac enlargement stable. Feeding tube crosses the gastroesophageal junction and right internal jugular central line also in unchanged position.  Vascular pattern is normal. No consolidation or effusion. Mild residual left lower lobe atelectasis.  IMPRESSION: No active disease.   Electronically Signed   By: Skipper Cliche M.D.   On: 08/25/2014 12:26   Dg Chest Port 1 View  08/22/2014   CLINICAL DATA:  Shortness of breath.  Evaluate atelectasis  EXAM: PORTABLE CHEST - 1 VIEW  COMPARISON:  08/21/2014; 08/20/2014; 08/19/2014  FINDINGS: Grossly unchanged enlarged cardiac silhouette. Bilateral infrahilar heterogeneous opacities are unchanged. No new focal airspace opacities. No evidence of edema. No pleural effusion. A skin fold overlies the right upper lobe. No pleural effusion. Unchanged bones.  IMPRESSION: 1. Grossly unchanged bilateral infrahilar heterogeneous opacities, likely atelectasis. 2. A skin fold overlies the right upper lung. No definite pneumothorax. Further evaluation could be performed with PA and lateral chest radiograph as clinically indicated.   Electronically Signed   By: Sandi Mariscal M.D.   On: 08/22/2014 07:47   Dg Chest Port 1 View  08/21/2014   CLINICAL DATA:  Extubated patient.  EXAM: PORTABLE CHEST -  1 VIEW  COMPARISON:  08/20/2014  FINDINGS: Since the prior study, the endotracheal tube and nasogastric tube have been removed. The right internal jugular central venous catheter remains unchanged.  Right lung base opacity has  improved. No new areas of lung opacity. No pulmonary edema. No pleural effusion or pneumothorax on this semi-erect study.  Cardiac silhouette is normal in size. No mediastinal or hilar masses.  IMPRESSION: 1. Improved right lung base opacity consistent with an improved infiltrate or improved atelectasis. 2. Status post extubation and removal of the nasogastric tube.   Electronically Signed   By: Lajean Manes M.D.   On: 08/21/2014 07:37   Dg Chest Port 1 View  08/20/2014   CLINICAL DATA:  Acute respiratory failure. On ventilator. Status post cardiac arrest.  EXAM: PORTABLE CHEST - 1 VIEW  COMPARISON:  08/19/2014  FINDINGS: Mild cardiomegaly is stable. Increased opacity is seen in the right lower lobe silhouetting the right hemidiaphragm. No evidence of pleural effusion or pneumothorax. Support lines and tubes in appropriate position.  IMPRESSION: Increased right lower lobe infiltrate.  Stable mild cardiomegaly.   Electronically Signed   By: Earle Gell M.D.   On: 08/20/2014 09:15   Dg Chest Port 1 View  08/19/2014   CLINICAL DATA:  Intubated. Congestive heart failure. Alcohol abuse.  EXAM: PORTABLE CHEST - 1 VIEW  COMPARISON:  One day prior  FINDINGS: Endotracheal tube unchanged with tip 3.5 cm above carina. Nasogastric tube extends beyond the inferior aspect of the film. Right internal jugular line tip mid SVC.  Mild cardiomegaly. No pleural effusion or pneumothorax. Improved to resolved left base airspace disease. Developing right base opacity.  IMPRESSION: Decreased to resolved left base airspace disease. New developing right base airspace disease. Favor shifting atelectasis. Early right-sided infection cannot be excluded.  Cardiomegaly without congestive failure.   Electronically Signed   By: Abigail Miyamoto M.D.   On: 08/19/2014 07:38   Dg Chest Port 1 View  08/18/2014   CLINICAL DATA:  Endotracheal to  EXAM: PORTABLE CHEST - 1 VIEW  COMPARISON:  Yesterday  FINDINGS: Tubular device is stable. No  pneumothorax. Normal heart size. Increasing left basilar airspace disease.  IMPRESSION: Increasing left basilar airspace disease.   Electronically Signed   By: Maryclare Bean M.D.   On: 08/18/2014 07:34   Portable Chest Xray In Am  08/17/2014   CLINICAL DATA:  Reassess support tube placement.  EXAM: PORTABLE CHEST - 1 VIEW  COMPARISON:  Portable chest x-ray of August 16, 2014  FINDINGS: The lungs are reasonably well inflated and clear. The cardiac silhouette is mildly enlarged though stable. A external pacing -defibrillator pads are present. The pulmonary vascularity is not engorged. The endotracheal tube tip lies 3.9 cm above the crotch of the carina. The esophagogastric tube tip lies in the gastric cardia. The proximal port likely lies at or above the GE junction. The right internal jugular venous catheter tip lies in the midportion of the SVC.  IMPRESSION: 1. There is no evidence of pneumonia nor pulmonary edema. 2. The endotracheal tube and internal jugular catheter are in appropriate position. Advancement of the nasogastric tube by 5-10 cm is recommended to assure that the proximal port is below the GE junction.   Electronically Signed   By: David  Martinique   On: 08/17/2014 07:25   Dg Chest Port 1 View  08/16/2014   CLINICAL DATA:  line placement  EXAM: PORTABLE CHEST - 1 VIEW  COMPARISON:  08/16/2014  FINDINGS: Right IJ  catheter tip projects over the mid SVC. Endotracheal tube tip projects 3.7 cm proximal to the carina. NG tube descends below the level of the image. Other wires and tubes are presumably external. Left chest external support devices obscure detailed evaluation. Heart size enlarged. Mediastinal contours otherwise within normal range. No definite consolidation, pleural effusion, or pneumothorax. Bilateral acromioclavicular DJD. No interval osseous change.  IMPRESSION: Support devices are appropriately positioned as above. No pneumothorax.  Mild cardiac enlargement.  No focal consolidation    Electronically Signed   By: Carlos Levering M.D.   On: 08/16/2014 23:03   Dg Chest Portable 1 View  08/16/2014   CLINICAL DATA:  Acute respiratory failure.  Atrial fibrillation.  EXAM: PORTABLE CHEST - 1 VIEW  COMPARISON:  02/14/2014  FINDINGS: Endotracheal tube is seen with tip approximately 6 cm above the carina.  Both lungs are clear. No evidence of pleural effusion. Mild cardiomegaly is stable.  IMPRESSION: Endotracheal tube in satisfactory position. No acute lung disease. Stable mild cardiomegaly.   Electronically Signed   By: Earle Gell M.D.   On: 08/16/2014 19:36   Dg Abd Portable 1v  08/21/2014   CLINICAL DATA:  Feeding tube placement.  EXAM: PORTABLE ABDOMEN - 1 VIEW  COMPARISON:  Single view of the abdomen 08/19/2014.  FINDINGS: Feeding tube is in place with the tip projecting inferiorly along the midbody of the stomach.  IMPRESSION: As above.   Electronically Signed   By: Inge Rise M.D.   On: 08/21/2014 12:53   Dg Abd Portable 1v  08/20/2014   CLINICAL DATA:  Feeding tube placement  EXAM: PORTABLE ABDOMEN - 1 VIEW  COMPARISON:  CT abdomen pelvis dated 06/14/2009  FINDINGS: Enteric tube terminates in the distal gastric antrum.  Nonspecific bowel gas pattern, but without disproportionate small bowel dilatation to suggest small bowel obstruction.  Degenerative changes of the lumbar spine.  IMPRESSION: Enteric tube terminates in the distal gastric antrum.   Electronically Signed   By: Julian Hy M.D.   On: 08/20/2014 00:36    CBC  Recent Labs Lab 08/25/14 0430 08/29/14 0650  WBC 9.8 12.5*  HGB 12.6* 12.5*  HCT 38.7* 37.7*  PLT 183 297  MCV 83.6 82.5  MCH 27.2 27.4  MCHC 32.6 33.2  RDW 17.8* 17.2*    Chemistries   Recent Labs Lab 08/23/14 0650 08/24/14 0312 08/25/14 0430 08/26/14 0435 08/29/14 0650  NA 149* 151* 148* 142 138  K 3.7 3.7 3.8 3.8 4.4  CL 110 112 110 104 100  CO2 30 29 29 29 29   GLUCOSE 129* 124* 120* 113* 102*  BUN 24* 24* 22 20 21     CREATININE 0.93 0.84 0.81 0.78 0.69  CALCIUM 8.4 8.4 8.7 8.7 9.1  MG 2.1  --   --   --   --   AST  --   --  125*  --   --   ALT  --   --  147*  --   --   ALKPHOS  --   --  53  --   --   BILITOT  --   --  0.6  --   --    ------------------------------------------------------------------------------------------------------------------ estimated creatinine clearance is 112.4 ml/min (by C-G formula based on Cr of 0.69). ------------------------------------------------------------------------------------------------------------------ No results found for this basename: HGBA1C,  in the last 72 hours ------------------------------------------------------------------------------------------------------------------ No results found for this basename: CHOL, HDL, LDLCALC, TRIG, CHOLHDL, LDLDIRECT,  in the last 72 hours ------------------------------------------------------------------------------------------------------------------ No results found for  this basename: TSH, T4TOTAL, FREET3, T3FREE, THYROIDAB,  in the last 72 hours ------------------------------------------------------------------------------------------------------------------ No results found for this basename: VITAMINB12, FOLATE, FERRITIN, TIBC, IRON, RETICCTPCT,  in the last 72 hours  Coagulation profile No results found for this basename: INR, PROTIME,  in the last 168 hours  No results found for this basename: DDIMER,  in the last 72 hours  Cardiac Enzymes No results found for this basename: CK, CKMB, TROPONINI, MYOGLOBIN,  in the last 168 hours ------------------------------------------------------------------------------------------------------------------ No components found with this basename: POCBNP,     Hobart Marte A D.O. on 08/29/2014 at 5:15 PM [phone] Between 7am to 7pm - Pager - [phone]  After 7pm go to [email or web address] - password [email or web address]  And look for the night coverage person covering for me after  hours  [facility] Office  [phone]

## 2014-08-29 NOTE — Progress Notes (Signed)
Patient RT:[id number] XXX Weinberg      DOB: 04/29/1951      BVA:[id number]   Anthony Mcclure called this evening and can meet but not until Friday at around 9 am will affirm appointment with her in the am.   Bradan Congrove L. Lovena Le, MD MBA The Palliative Medicine Team at [facility] Phone: [phone] Pager: [phone] ( Use team phone after hours)

## 2014-08-29 NOTE — Clinical Social Work Note (Signed)
CSW Guardianship Consult Acknowledged:    CSW conducted a conference call with DSS Ms. Enzo Bi the pt's cousin and Letitia Libra the pt's brother to discuss who will be making medical decision for the pt. The pt's brother Letitia Libra reported "[name] can make medical decision for my brother. I just ask if you all keep me updated." [name] agrees to continue to make medical decision for the pt.   CSW had a meeting with her team to discuss guardianship. At this time CSW team agrees that since the pt's brother, cousin and DSS is in agreement that [name] can continue to make decision for the pt guardianship is not needed.    Donnie Dechert  [facility]  [phone] [place] [address] [phone]   Greta Doom, MSW, [place]

## 2014-08-29 NOTE — Evaluation (Signed)
Speech Language Pathology Evaluation Patient Details Name: Anthony Mcclure MRN: [id number] DOB: Jun 02, 1951 Today's Date: 08/29/2014 Time: 9798-9211 SLP Time Calculation (min): 29 min  Problem List:  Patient Active Problem List   Diagnosis Date Noted  . Spinal cord infarction 08/24/2014  . Dehydration with hypernatremia 08/24/2014  . Dysphagia 08/24/2014  . Protein calorie malnutrition 08/24/2014  . Quadriplegia 08/19/2014  . Cardiac arrest 08/16/2014  . PEA (Pulseless electrical activity) 08/16/2014  . Acute respiratory failure with hypoxia 08/16/2014  . Alcohol dependence 07/20/2013  . Preventative health care 04/16/2012  . Alcohol withdrawal seizure 02/02/2012  . Hepatitis C 02/02/2012  . HTN (hypertension) 02/02/2012  . Noncompliance with medication treatment due to intermittent use of medication 02/02/2012  . Alcohol abuse 02/01/2012  . Paroxysmal Atrial Flutter 02/01/2012   Past Medical History:  Past Medical History  Diagnosis Date  . CHF (congestive heart failure)   . Alcohol abuse   . Seizure   . Noncompliance with medication treatment due to intermittent use of medication 02/02/2012  . HTN (hypertension) 02/02/2012  . Alcohol withdrawal seizure 02/02/2012  . Hypertension   . Dysrhythmia     paroxismal atrial fib  . Paroxysmal Atrial Flutter 02/01/2012  . Hepatitis C 02/02/2012    Unsure if he was ever diagnosed with Hep C  . Hepatitis C    Past Surgical History:  Past Surgical History  Procedure Laterality Date  . Forearm reconstruction     HPI:  [name] brought to ED on 9/3 after he suffered an out of hospital cardiac arrest. No CPR was performed until 10-15 minutes later when EMS arrived. In ED, pt was comatose and was intubated for respiratory failure, hypothermia protocol initiated. Following intubation, he became hypotensive. MRI brain/C Spine: abn signal to cervical cord C3-4 ? Possible cord infarct or edema . Brain neg for acute process.  EEG is  consistent with a generalized nonspecific cerebral dysfunction (encephalopathy). Pt intubated from 9/3-9/7.    Assessment / Plan / Recommendation Clinical Impression  Pt demonstrates multiple impairments impacting ability to functionally communicate. 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 Complicating factor is pts severely impaired cognitive function. With max cues and context pt is able to correctly articulate words and phrases (counting, days of the week, Y/N, simple phrases). Otherwise pts speech is self directed and without context is unintelligible. For example pt can be understood to say "water" and then say "Please give me some water" when cued to repeat, however if he is directed to respond Y/N in a basic communication task, with max cues he is unable to give reliable response without trying to extend to an unintelligible phrase. Suspect deficits are primarily due to poorly sustained attention, lack of awareness, problem solving and impulsivity. Pt will benefit from ongoing SLP therapy to facilitate basic functional communicaiton and rehabilitate voice. Any medical management that could facilitate vocal fold recovery would be helpful.   Pt also progressing with diet tolerance of puree and honey thick textures. He was given trials of water with significant delay in initiation and ongoing concern for aspiration.  With repeat objective testing in the future as time post intubation increases, pt may demonstrate improved airway protection and ability to upgrade liquid texture.     SLP Assessment  Patient needs continued [place] Pathology Services  Follow Up Recommendations  Skilled Nursing facility    Frequency and Duration min 2x/week  2 weeks   Pertinent Vitals/Pain Pain Assessment:  Faces Faces Pain Scale: No hurt   SLP Goals  SLP Goals Potential to Achieve Goals: Fair Potential Considerations: Family/community support;Severity of impairments  SLP Evaluation Prior Functioning  Cognitive/Linguistic Baseline: Information not available   Cognition  Overall Cognitive Status: Impaired/Different from baseline Arousal/Alertness: Lethargic Orientation Level: Oriented to person;Disoriented to place Attention: Focused;Sustained Focused Attention: Appears intact Sustained Attention: Appears intact Memory:  (UTA) Awareness: Impaired Awareness Impairment: Intellectual impairment;Emergent impairment;Other (comment) (based no use of independent repair strategies in communicati) Problem Solving: Impaired Problem Solving Impairment: Verbal basic Executive Function: Self Monitoring;Self Correcting Self Monitoring: Impaired Self Monitoring Impairment: Verbal basic Self Correcting: Impaired Self Correcting Impairment: Verbal basic    Comprehension  Auditory Comprehension Overall Auditory Comprehension: Impaired Yes/No Questions: Impaired Basic Biographical Questions: 26-50% accurate Basic Immediate Environment Questions: 0-24% accurate Commands: Impaired One Step Basic Commands: 75-100% accurate Interfering Components: Attention Reading Comprehension Reading Status: Not tested    Expression Verbal Expression Overall Verbal Expression: Impaired Initiation: No impairment Automatic Speech: Name;Counting;Day of week;Social Response Level of Generative/Spontaneous Verbalization: Phrase Repetition: Impaired Level of Impairment: Word level;Phrase level Naming: Impairment Verbal Errors: Not aware of errors Interfering Components: Attention;Speech intelligibility   Oral / Motor Oral Motor/Sensory Function Overall Oral Motor/Sensory Function: Impaired Labial ROM: Within Functional Limits Labial Symmetry: Within Functional Limits Labial Strength: Reduced Labial  Sensation: Within Functional Limits Lingual ROM: Within Functional Limits Lingual Symmetry: Within Functional Limits Lingual Strength: Within Functional Limits Lingual Sensation: Within Functional Limits Facial ROM: Within Functional Limits Facial Symmetry: Within Functional Limits Facial Strength: Within Functional Limits Facial Sensation: Within Functional Limits Motor Speech Overall Motor Speech: Impaired Respiration: Impaired Level of Impairment: Word Phonation: Aphonic Resonance: Within functional limits Articulation: Impaired Level of Impairment: Word Intelligibility: Intelligibility reduced Word: 25-49% accurate Phrase: 0-24% accurate Motor Planning: Witnin functional limits Motor Speech Errors: Unaware Effective Techniques: Increased vocal intensity   GO    Herbie Baltimore, MA CCC-SLP [phone]  Lynann Beaver 08/29/2014, 9:56 AM

## 2014-08-29 NOTE — Progress Notes (Signed)
Patient KP:Anthony Mcclure      DOB: November 17, 1951      LEX:[id number]   Palliative Medicine Team at [facility] Progress Note    Subjective: Anthony Mcclure is in good spirits.  He is not oriented but he is awake and alert.  Nursing states he is eating well.  Umer can move his left foot and very slightly the toes of his right foot.  He is in no acute distress despite his quadriparesis.  Filed Vitals:   08/29/14 1543  BP: 129/81  Pulse: 76  Temp: 98.4 F (36.9 C)  Resp: 18   Physical exam:  General: no acute distress PERRL, EOMI, and mmm speech slightly slurred Chest: decreased no wheezing CVS: regular S1, S2 ABd: soft, slightly distended but not tender , no guarding or rebound Ext: quadriparesis, trace non pitting edema Neuro: quadriparesis, confusion   Assessment and plan: 63 yr old african [place] male s/p possible trauma and cardiac arrest.  Patient is currently quadriparetic.    1.  Full code.  Attempting to contact the patient cousin [name].  2.  Quadriparesis: MRI pending  3.  Tolerating modified diet. Careful feeding very appropriate.   Total time:  15 min   Psalm Arman L. Lovena Le, MD MBA The Palliative Medicine Team at [facility] Phone: [phone] Pager: [phone] ( Use team phone after hours)

## 2014-08-30 MED ORDER — ENSURE PUDDING PO PUDG
1.0000 | Freq: Three times a day (TID) | ORAL | Status: DC
  Administered 2014-08-30 – 2014-08-31 (×3): 1 via ORAL

## 2014-08-30 MED ORDER — FLUCONAZOLE 100 MG PO TABS
100.0000 mg | ORAL_TABLET | Freq: Every day | ORAL | Status: DC
  Administered 2014-08-30 – 2014-08-31 (×2): 100 mg via ORAL
  Filled 2014-08-30 (×2): qty 1

## 2014-08-30 MED ORDER — FLUCONAZOLE 100 MG PO TABS: 100.0000 mg | ORAL_TABLET | Freq: Every day | ORAL | Status: DC

## 2014-08-30 MED ORDER — ENOXAPARIN SODIUM 40 MG/0.4ML ~~LOC~~ SOLN
40.0000 mg | SUBCUTANEOUS | Status: DC
  Administered 2014-08-30: 40 mg via SUBCUTANEOUS
  Filled 2014-08-30: qty 0.4

## 2014-08-30 MED ORDER — ACETAMINOPHEN 325 MG PO TABS: 650.0000 mg | ORAL_TABLET | Freq: Four times a day (QID) | ORAL | Status: DC | PRN

## 2014-08-30 NOTE — Progress Notes (Signed)
NUTRITION FOLLOW UP/New Consult  Intervention:   -Ensure Pudding po TID, each supplement provides 170 kcal and 4 grams of protein -Magic cup TID with meals, each supplement provides 290 kcal and 9 grams of protein -Continue to encourage adequate PO intake  Nutrition Dx:   Inadequate oral intake now related to swallowing difficulty as evidenced by Dysphagia 1 diet.   Goal:   Patient will meet >/=90% of estimated nutrition needs, not met, but improved  Monitor:   PO intake, swallowing function, weights, labs  Assessment:   [name] brought to ED on 9/3 after he suffered an out of hospital cardiac arrest. In ED, pt was comatose and was intubated for respiratory failure. PCCM was consulted for admission and initiation of hypothermia protocol.  TF and panda D/C 9/15. Patient now on Dysphagia 1, honey thick liquids diet. Intake is good with 50-100% of meals consumed. However, patient likely needs nutrition supplements to promote adequate PO intake.    Height: Ht Readings from Last 1 Encounters:  08/18/14 6' (1.829 m)    Weight Status:   Wt Readings from Last 1 Encounters:  08/30/14 195 lb 3.2 oz (88.542 kg)    Re-estimated needs:  Kcal: 2000-2200  Protein: 110-135 gm  Fluid: 2 L  Skin: 1+ generalized edema, 1+ RUE and LUE edema  Diet Order: Dysphagia 1, honey-thick liquids   Intake/Output Summary (Last 24 hours) at 08/30/14 1227 Last data filed at 08/30/14 0905  Gross per 24 hour  Intake    440 ml  Output    850 ml  Net   -410 ml    Last BM: 9/13   Labs:   Recent Labs Lab 08/25/14 0430 08/26/14 0435 08/29/14 0650  NA 148* 142 138  K 3.8 3.8 4.4  CL 110 104 100  CO2 _0 BUN _1 CREATININE 0.81 0.78 0.69  CALCIUM 8.7 8.7 9.1  GLUCOSE 120* 113* 102*    CBG (last 3)   Recent Labs  08/28/14 2014 08/29/14 0007 08/29/14 0522  GLUCAP 134* 117* 114*    Scheduled Meds: . antiseptic oral rinse  7 mL Mouth Rinse q12n4p  . aspirin  325 mg  Oral Daily  . chlorhexidine  15 mL Mouth Rinse BID  . enoxaparin (LOVENOX) injection  40 mg Subcutaneous Q24H  . metoprolol tartrate  25 mg Oral BID  . senna  1 tablet Oral QHS    Continuous Infusions:   Larey Seat, RD, LDN Pager #: [phone] Pager #: [phone]

## 2014-08-30 NOTE — Progress Notes (Signed)
Physical Therapy Treatment Patient Details Name: Anthony Mcclure MRN: [id number] DOB: January 05, 1951 Today's Date: 08/30/2014    History of Present Illness 63 yo adm 08/16/14 s/p cardiac arrest with 15-30 minute prior to return of spontaneous circulation (ROSC). Pt intubated and underwent hypothermia protocol. 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 MRI c-spine showed abnormal signal in the cord from the craniocervical junction through C3-4 level.  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 PMHx-CHF, hepatitis C, ETOH abuse, substance abuse, seizures, forearm reconstruction    PT Comments    Emphasized sitting balance in long sitting  Propped back on arms.  Pt became too diaphoretic to continue on to the chair after about 10-12 min in sitting.  BP at 84/50.  Follow Up Recommendations  SNF     Equipment Recommendations   (TBA next venue)    Recommendations for Other Services       Precautions / Restrictions Precautions Precautions: Fall Precaution Comments: watch BP, signs of dyreflexias    Mobility  Bed Mobility Overal bed mobility: Needs Assistance;+2 for physical assistance;+ 2 for safety/equipment Bed Mobility: Supine to Sit;Sit to Supine     Supine to sit: Total assist;+2 for physical assistance;+2 for safety/equipment Sit to supine: Total assist;+2 for safety/equipment;+2 for physical assistance   General bed mobility comments: total dependence for all mobility  Transfers                 General transfer comment: pt became profusely diaphoretic with a BP sitting 84/50 so sit in chair aborted.  Awaiting compression assist  Ambulation/Gait                 Stairs            Wheelchair Mobility    Modified Rankin (Stroke Patients Only)       Balance Overall balance assessment: Needs assistance   Sitting balance-Leahy Scale: Zero       Standing balance-Leahy Scale: Zero                      Cognition Arousal/Alertness:  Awake/alert Behavior During Therapy: WFL for tasks assessed/performed Overall Cognitive Status: Impaired/Different from baseline                 General Comments: pt not reliable with yes/no and runs on verbally with each question     Exercises Other Exercises Other Exercises: PROM to all 4's    General Comments        Pertinent Vitals/Pain Pain Assessment:  (no s/s of pain) Faces Pain Scale: No hurt    Home Living                      Prior Function            PT Goals (current goals can now be found in the care plan section) Acute Rehab PT Goals Patient Stated Goal: uanble to state/communicate PT Goal Formulation: Patient unable to participate in goal setting Time For Goal Achievement: 09/04/14 Potential to Achieve Goals: Good Progress towards PT goals: Not progressing toward goals - comment (too diaphoretic to continue.  Needs compression assist)    Frequency  Min 3X/week    PT Plan Current plan remains appropriate    Co-evaluation             End of Session   Activity Tolerance: Patient tolerated treatment well Patient left: in bed;with call bell/phone within reach (in sitting postition)  Time: 0223-3612 PT Time Calculation (min): 28 min  Charges:  $Therapeutic Activity: 23-37 mins                    G Codes:      Josiel Gahm, Tessie Fass 08/30/2014, 3:51 PM 08/30/2014  Donnella Sham, PT [phone]  (pager)

## 2014-08-30 NOTE — Clinical Social Work Note (Signed)
CSW received a call from pt's cousin [name]. [name] reported that she has mad a decide regarding which SNF she would like the pt to transition to. [name] reported that she would like the pt to go to [facility]. CSW left a voice message for the admission coordinator regarding the family decision.      [place], MSW, [place]

## 2014-08-30 NOTE — Progress Notes (Signed)
[facility]                                                                              Patient Demographics  Anthony Mcclure, is a 63 y.o. male, DOB - 11-28-1951, ZOX:[id number]  Admit date - 08/16/2014   Admitting Physician Chesley Mires, MD  Outpatient Primary MD for the patient is Anthony Marek, MD  LOS - 66   Chief Complaint  Patient presents with  . Cardiac Arrest      HPI/Interim history [name] brought to ED on 9/3 after he suffered an out of hospital cardiac arrest. Arrest was witnessed; however, no CPR was performed until 10-15 minutes later when EMS arrived. Upon their arrival, rhythm was noted to be PEA. ACLS was performed for 15 minutes before ROSC. During resuscitation, pt received 2 of epi and 1amp D50. In ED, pt was comatose and was intubated for respiratory failure. PCCM was consulted for admission and initiation of hypothermia protocol.   Postarrest EKG and enzymes were negative and not consistent with ischemic etiology to the arrest. Patient eventually stabilized and hypothermia was reversed and patient was able to be extubated. After regaining consciousness patient was unable to move his extremities. MRI of his brain was unremarkable no acute normality. MRI of the cervical spine showed abnormal increased T2 signal in the cervical cord extending from the craniocervical junction through the C3/4 level possibly from cord infarction/cord edema or chronic myelomalacia. Neurology was consulted. Patient underwent EEG on 9/5 that demonstrated generalized slowing consistent with encephalopathic state but otherwise was unremarkable.   Dr. Vertell Limber in neurosurgery who reviewed the images and stated he did not see any active compression and therefore did not feel there was any role for acute surgery or steroids. Neurology was also concerned about his mental status and documented there may be some sort of hypoxic brain injury there as well.   Patient has been evaluated by OT, PT  and speech therapy.  He was evaluated by CIR but was deemed not an appropriate candidate for inpatient stay at this time. Recommendation is to either pursue skilled nursing facility. [facility] has taken over care of this patient as of 08/24/14.  Patient is not a candidate for LTAC.  Palliative care has been consulted, meeting with patient's cousin for 08/31/2014. Pending further recommendations from palliative care.  Dysphagia 1 diet started. panda tube was removed 9-15. Started on ensure for nutrition support.    Assessment & Plan   Cardiac arrest/PEA  -Based on cardiac enzymes and EKG does not appear to be ischemic in etiology-unclear what precipitated the initial event   Acute respiratory failure with hypoxia/post arrest pulmonary edema/MRSA pneumonia  -Repeat CXR negative. Lungs CTA. -Vancomycin discontinued  Dysphagia  - Patient was started on dysphagia 1 diet. Per nurse he ate 100 % breakfast.  -will consult nutritionist, for nutrition requirement.  - Per SW guardians ship not needed, as brother and cousin will be making decision. Palliative to discussed with family Goals of care prior to discharge on 9-18.   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 -MRI repeated 9-17: Decreased cephalo caudad extent of T2 signal abnormality at the C1-2 level. There is no diffusion abnormality. The findings are most compatible with acute trauma.    Dehydration with hypernatremia  -Resolved. Continue free water   Alcohol abuse/ Hepatitis C   Hypertension -Stable, continue metoprolol  Protein calorie malnutrition  -dysphagia diet. Nutritionist consulted.   Urinary retention: continue foley Oral candidiasis: Will start fluconazole.   Code Status: Full  Family Communication:   Disposition Plan: Admitted.  Palliative care meeting tomorrow.   Time  Spent in minutes   30 minutes  Procedures/Studies/Events 9/3 Admitted with cardiac arrest, hypothermia started  9/3 CT Head / C-Spine >>> nad  9/4 TTE >>> EF 55%  9/5 Rewarming finished  9/5 EEG >>> nonspecific cerebral dsyfxn, no seizures  9/6 New finding of quadriparesis, MRI brain / C-spine ordered  9/7 MRI brain/C Spine: abn signal to cervical cord C3-4 ? Possible cord infarct or edema . Brain neg for acute process.  9/7 Passed SBT. Strong cough when suctioned. Extubated.  9/14 Palliative care meeting 9/15 started on dysphagia 1 diet  Consults   PCCM Neurology Palliative Care CIR  DVT Prophylaxis  Heparin  Lab Results  Component Value Date   PLT 297 08/29/2014    Medications  Scheduled Meds: . antiseptic oral rinse  7 mL Mouth Rinse q12n4p  . aspirin  325 mg Oral Daily  . chlorhexidine  15 mL Mouth Rinse BID  . enoxaparin (LOVENOX) injection  40 mg Subcutaneous Q24H  . feeding supplement (ENSURE)  1 Container Oral TID BM  . fluconazole  100 mg Oral Daily  . metoprolol tartrate  25 mg Oral BID  . senna  1 tablet Oral QHS   Continuous Infusions:   PRN Meds:.acetaminophen, ALPRAZolam, bisacodyl, fentaNYL, pneumococcal 23 valent vaccine  Antibiotics    Anti-infectives   Start     Dose/Rate Route Frequency Ordered Stop   08/30/14 1600  fluconazole (DIFLUCAN) tablet 100 mg     100 mg Oral Daily 08/30/14 1558     08/30/14 0500  fluconazole (DIFLUCAN) tablet 100 mg  Status:  Discontinued     100 mg Oral Daily 08/30/14 1557 08/30/14 1558   08/23/14 1100  vancomycin (VANCOCIN) 1,250 mg in sodium chloride 0.9 % 250 mL IVPB  Status:  Discontinued     1,250 mg 166.7 mL/hr over 90 Minutes Intravenous Every 8 hours 08/23/14 1036 08/26/14 1436   08/22/14 0030  vancomycin (VANCOCIN) IVPB 1000 mg/200 mL premix  Status:  Discontinued     1,000 mg 200 mL/hr over 60 Minutes Intravenous Every 8 hours 08/21/14 1559 08/23/14 1036   08/21/14 1630  vancomycin (VANCOCIN) 1,750 mg in  sodium chloride 0.9 % 500 mL IVPB     1,750 mg 250 mL/hr over 120 Minutes Intravenous  Once 08/21/14 1559 08/21/14 1838   08/19/14 1200  Ampicillin-Sulbactam (UNASYN) 3 g in sodium chloride 0.9 % 100 mL IVPB  Status:  Discontinued     3 g 100 mL/hr over 60 Minutes Intravenous Every 6 hours 08/19/14 1140 08/22/14 1027        Subjective:   Marion Downer seen and examined today.  Speech soft, he was able to eat breakfast [er nurse.   Objective:   Filed Vitals:   08/30/14 0551 08/30/14 0900 08/30/14 1000 08/30/14 1358  BP: 146/84  129/78 102/66  Pulse: 72  69 67  Temp: 98.6 F (37 C)  98.4 F (36.9 C) 98.6 F (37 C)  TempSrc: Oral  Oral Oral  Resp: 18  20 20   Height:      Weight:  88.542 kg (195 lb 3.2 oz)    SpO2: 99%  100% 98%    Wt Readings from Last 3 Encounters:  08/30/14 88.542 kg (195 lb 3.2 oz)  04/20/14 92.443 kg (203 lb 12.8 oz)  02/19/14 91.264 kg (201 lb 3.2 oz)     Intake/Output Summary (Last 24 hours) at 08/30/14 1606 Last data filed at 08/30/14 0905  Gross per 24 hour  Intake    440 ml  Output    850 ml  Net   -410 ml    Exam  General:  NAD, appears stated age  HEENT: NCAT,  mucous membranes moist.  poor dentition, white plaque tongue.   Cardiovascular: S1 S2 auscultated, no rubs, murmurs or gallops. Regular rate and rhythm.  Respiratory: Clear to auscultation bilaterally with equal chest rise  Abdomen: Soft, nontender, nondistended, + bowel sounds  Extremities: warm dry without cyanosis clubbing  Neurology: Awake, alert, unable to assess orientation.  Speech not clear,  Can move LLE some.   Data Review   Micro Results Recent Results (from the past 240 hour(s))  CLOSTRIDIUM DIFFICILE BY PCR     Status: None   Collection Time    08/23/14  1:30 PM      Result Value Ref Range Status   C difficile by pcr NEGATIVE  NEGATIVE Final  URINE CULTURE     Status: None   Collection Time    08/25/14  4:45 AM      Result Value Ref Range Status     Specimen Description URINE, CATHETERIZED   Final   Special Requests NONE   Final   Culture  Setup Time     Final   Value: 08/25/2014 16:02     Performed at [facility]     Final   Value: NO GROWTH     Performed at [facility]   Culture     Final   Value: NO GROWTH     Performed at [facility]   Report Status 08/26/2014 FINAL   Final    Radiology Reports Ct Head Wo Contrast  08/16/2014   CLINICAL DATA:  CPR  EXAM: CT HEAD WITHOUT CONTRAST  CT MAXILLOFACIAL WITHOUT CONTRAST  TECHNIQUE: Multidetector CT imaging of the head and cervical spine structures were performed using the standard protocol without intravenous contrast. Multiplanar CT image reconstructions of the maxillofacial structures were also generated.  COMPARISON:  10/24/2010 and 06/14/2009  FINDINGS: CT HEAD FINDINGS  Stable left frontal soft tissue lipoma over the periosteum.  Global atrophy and chronic ischemic changes are stable. No mass effect, midline shift, or acute intracranial hemorrhage. Mastoid air cells are clear. Minimal mucosal thickening in the ethmoid air cells. Old fracture of the medial wall of the left orbit.  CT CERVICAL SPINE FINDINGS  No acute fracture. No dislocation. 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 There is multilevel spinal stenosis. No obvious spinal hematoma. No obvious soft tissue injury. Endotracheal tube is in place.  IMPRESSION: No acute intracranial pathology.  No evidence of acute cervical spine injury. Advanced degenerative changes are noted.   Electronically Signed   By: Maryclare Bean M.D.   On: 08/16/2014 19:59   Ct Cervical Spine Wo Contrast  08/16/2014   CLINICAL DATA:  CPR  EXAM: CT HEAD WITHOUT CONTRAST  CT MAXILLOFACIAL WITHOUT CONTRAST  TECHNIQUE: Multidetector CT imaging of the head and cervical spine structures were performed using the standard protocol without intravenous contrast. Multiplanar CT image  reconstructions of the maxillofacial structures were also generated.  COMPARISON:  10/24/2010 and 06/14/2009  FINDINGS: CT HEAD FINDINGS  Stable left frontal soft tissue lipoma over the periosteum.  Global atrophy and chronic ischemic changes are stable. No mass effect, midline shift, or acute intracranial hemorrhage. Mastoid air cells are clear. Minimal mucosal thickening in the ethmoid air cells. Old fracture of the medial wall of the left orbit.  CT CERVICAL SPINE FINDINGS  No acute fracture. No dislocation. 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 There is multilevel spinal stenosis. No obvious spinal hematoma. No obvious soft tissue injury. Endotracheal tube is in place.  IMPRESSION: No acute intracranial pathology.  No evidence of acute cervical spine injury. Advanced degenerative changes are noted.   Electronically Signed   By: Maryclare Bean M.D.   On: 08/16/2014 19:59   Mr Jeri Cos RJ Contrast  08/19/2014   CLINICAL DATA:  63 year old hypertensive male with history of alcohol abuse, alcohol withdrawal seizures, hepatitis and atrial flutter. Found down and received CPR. Unable to move upper extremities post CPR. Old head injury.  EXAM: MRI HEAD WITHOUT AND WITH CONTRAST  TECHNIQUE: Multiplanar, multiecho pulse sequences of the brain and surrounding structures were obtained without and with intravenous contrast.  CONTRAST:  20 cc MultiHance.  COMPARISON:  MR cervical spine performed the same date and dictated separately.  Head CT 08/16/2014.  No comparison brain MR.  FINDINGS: Some the sequences are motion degraded.  No acute infarct  No intracranial hemorrhage.  Global atrophy. Ventricular size may be related to atrophy rather than hydrocephalus.  No intracranial mass or abnormal enhancement.  No MR evidence of Wernicke's encephalopathy.  Major intracranial vascular structures are patent.  Abnormal appearance of the upper cervical spine better delineated on recent cervical  spine MR. Please see report from such. An additional consideration is that the patient had a traumatic event (such as a fall) which caused cord contusion contributing to patient's inability to move extremities. Subacute combined degeneration/ vitamin D deficiency contributing to the cervical cord changes although possible given the patient's history of alcohol use is felt unlikely as cause of the current findings.  Prominent pannus.  This contributes to baseline spinal stenosis.  Left frontal scalp lipoma.  IMPRESSION: Some the sequences are motion degraded.  No acute infarct  No intracranial hemorrhage.  Global atrophy. Ventricular size may be related to atrophy rather than hydrocephalus.  No intracranial mass or abnormal enhancement.  No MR evidence of Wernicke's encephalopathy.  Abnormal appearance of the upper cervical spine better delineated on recent cervical spine MR. Please see report from such. An additional consideration is that the patient had a traumatic event (such as a fall) which caused cord contusion contributing to patient's inability to move extremities.   Electronically Signed   By: Chauncey Cruel M.D.   On: 08/19/2014 17:56   Mr Cervical Spine W Wo Contrast  08/19/2014   CLINICAL DATA:  Found down hypothermic, received CPR. Pneumonia. Not moving upper extremities. Ventilator. Sedated.  EXAM: MRI CERVICAL SPINE WITHOUT AND WITH CONTRAST  TECHNIQUE: Multiplanar and multiecho pulse sequences of the cervical spine, to include the craniocervical junction and cervicothoracic junction, were obtained according to standard protocol without and with intravenous contrast.  CONTRAST:  61mL MULTIHANCE GADOBENATE DIMEGLUMINE 529 MG/ML IV SOLN  COMPARISON:  08/16/2014  FINDINGS: Despite  efforts by the technologist and patient, motion artifact is present on today's exam and could not be eliminated. This reduces exam sensitivity and specificity.  Chronic degenerative findings and pannus posterior to the odontoid  with likely chronic erosion of the odontoid, causing exaggerated curvature of the thecal sac at the craniocervical junction. Low-grade enhancement surrounds this pannus. Abnormal but low grade osseous edema noted anteriorly in C1 and C2.  The patient is orally intubated.  Abnormal increased T2 signal in the cord extends from the skull base through the C3-4 level, especially centrally, and most severely at the C1 vertebral level. No associated enhancement in the cord to suggest that this is due to high-grade tumor. Gradient images are severely limited by motion artifact.  There is reversal of the normal cervical lordosis with loss of intervertebral disc height at C6-7 and C7-T 1. Multilevel degenerative endplate findings especially at C7-T1. Diffuse low-grade third spacing of fluid in the neck.  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  Additional findings at individual levels are as follows:  C1-2: Pannus and exaggerated curvature at the cervicothoracic junction cause moderate central narrowing of the thecal sac. Spurring from C1 causes at least mild bilateral foraminal stenosis, and there is a posterior fusion of the left C1-2 articulation.  C2-3: Prominent left and moderate right foraminal stenosis with moderate central narrowing of the thecal sac due to posterior osseous ridging, disc bulge, and facet arthropathy.  C3-4: Prominent left and moderate right foraminal stenosis with mild central narrowing of the thecal sac due to uncinate spurring facet arthropathy, and disc bulge.  C4-5: Moderate right and mild left foraminal stenosis due to uncinate and facet spurring.  C5-6: Moderate left foraminal stenosis due to facet arthropathy and uncinate spurring.  C6-7: Moderate bilateral foraminal stenosis due to uncinate and facet spurring along with mild disc bulge.  C7-T1: Prominent bilateral foraminal stenosis due to posterior osseous ridging, uncinate spurring, and facet arthropathy.  IMPRESSION:  1. Abnormal signal in the cervical cord extending from the craniocervical junction through the C3-4 level, possibly from cord infarct, cord edema, or chronic myelomalacia. No associated cord enhancement to suggest high-grade tumor. 2. Chronic degenerative findings at C1-2 and C2-3 with considerable pannus posterior to the odontoid, causing exaggerated curvature of the cord and central narrowing of the thecal sac. 3. Cervical spondylosis and degenerative disc disease is also present. Overall associated impingement is graded as prominent at C2-3, C3-4, and C7-T1; and moderate at C1-2, C4-5, C5-6, and C6-7, as detailed above.   Electronically Signed   By: Sherryl Barters M.D.   On: 08/19/2014 16:55   Dg Chest Port 1 View  08/25/2014   CLINICAL DATA:  Dyspnea  EXAM: PORTABLE CHEST - 1 VIEW  COMPARISON:  08/22/2014  FINDINGS: Mild to moderate cardiac enlargement stable. Feeding tube crosses the gastroesophageal junction and right internal jugular central line also in unchanged position.  Vascular pattern is normal. No consolidation or effusion. Mild residual left lower lobe atelectasis.  IMPRESSION: No active disease.   Electronically Signed   By: Skipper Cliche M.D.   On: 08/25/2014 12:26   Dg Chest Port 1 View  08/22/2014   CLINICAL DATA:  Shortness of breath.  Evaluate atelectasis  EXAM: PORTABLE CHEST - 1 VIEW  COMPARISON:  08/21/2014; 08/20/2014; 08/19/2014  FINDINGS: Grossly unchanged enlarged cardiac silhouette. Bilateral infrahilar heterogeneous opacities are unchanged. No new focal airspace opacities. No evidence of edema. No pleural effusion. A skin fold overlies the right upper lobe. No  pleural effusion. Unchanged bones.  IMPRESSION: 1. Grossly unchanged bilateral infrahilar heterogeneous opacities, likely atelectasis. 2. A skin fold overlies the right upper lung. No definite pneumothorax. Further evaluation could be performed with PA and lateral chest radiograph as clinically indicated.    Electronically Signed   By: Sandi Mariscal M.D.   On: 08/22/2014 07:47   Dg Chest Port 1 View  08/21/2014   CLINICAL DATA:  Extubated patient.  EXAM: PORTABLE CHEST - 1 VIEW  COMPARISON:  08/20/2014  FINDINGS: Since the prior study, the endotracheal tube and nasogastric tube have been removed. The right internal jugular central venous catheter remains unchanged.  Right lung base opacity has improved. No new areas of lung opacity. No pulmonary edema. No pleural effusion or pneumothorax on this semi-erect study.  Cardiac silhouette is normal in size. No mediastinal or hilar masses.  IMPRESSION: 1. Improved right lung base opacity consistent with an improved infiltrate or improved atelectasis. 2. Status post extubation and removal of the nasogastric tube.   Electronically Signed   By: Lajean Manes M.D.   On: 08/21/2014 07:37   Dg Chest Port 1 View  08/20/2014   CLINICAL DATA:  Acute respiratory failure. On ventilator. Status post cardiac arrest.  EXAM: PORTABLE CHEST - 1 VIEW  COMPARISON:  08/19/2014  FINDINGS: Mild cardiomegaly is stable. Increased opacity is seen in the right lower lobe silhouetting the right hemidiaphragm. No evidence of pleural effusion or pneumothorax. Support lines and tubes in appropriate position.  IMPRESSION: Increased right lower lobe infiltrate.  Stable mild cardiomegaly.   Electronically Signed   By: Earle Gell M.D.   On: 08/20/2014 09:15   Dg Chest Port 1 View  08/19/2014   CLINICAL DATA:  Intubated. Congestive heart failure. Alcohol abuse.  EXAM: PORTABLE CHEST - 1 VIEW  COMPARISON:  One day prior  FINDINGS: Endotracheal tube unchanged with tip 3.5 cm above carina. Nasogastric tube extends beyond the inferior aspect of the film. Right internal jugular line tip mid SVC.  Mild cardiomegaly. No pleural effusion or pneumothorax. Improved to resolved left base airspace disease. Developing right base opacity.  IMPRESSION: Decreased to resolved left base airspace disease. New developing  right base airspace disease. Favor shifting atelectasis. Early right-sided infection cannot be excluded.  Cardiomegaly without congestive failure.   Electronically Signed   By: Abigail Miyamoto M.D.   On: 08/19/2014 07:38   Dg Chest Port 1 View  08/18/2014   CLINICAL DATA:  Endotracheal to  EXAM: PORTABLE CHEST - 1 VIEW  COMPARISON:  Yesterday  FINDINGS: Tubular device is stable. No pneumothorax. Normal heart size. Increasing left basilar airspace disease.  IMPRESSION: Increasing left basilar airspace disease.   Electronically Signed   By: Maryclare Bean M.D.   On: 08/18/2014 07:34   Portable Chest Xray In Am  08/17/2014   CLINICAL DATA:  Reassess support tube placement.  EXAM: PORTABLE CHEST - 1 VIEW  COMPARISON:  Portable chest x-ray of August 16, 2014  FINDINGS: The lungs are reasonably well inflated and clear. The cardiac silhouette is mildly enlarged though stable. A external pacing -defibrillator pads are present. The pulmonary vascularity is not engorged. The endotracheal tube tip lies 3.9 cm above the crotch of the carina. The esophagogastric tube tip lies in the gastric cardia. The proximal port likely lies at or above the GE junction. The right internal jugular venous catheter tip lies in the midportion of the SVC.  IMPRESSION: 1. There is no evidence of pneumonia nor pulmonary edema. 2.  The endotracheal tube and internal jugular catheter are in appropriate position. Advancement of the nasogastric tube by 5-10 cm is recommended to assure that the proximal port is below the GE junction.   Electronically Signed   By: David  Martinique   On: 08/17/2014 07:25   Dg Chest Port 1 View  08/16/2014   CLINICAL DATA:  line placement  EXAM: PORTABLE CHEST - 1 VIEW  COMPARISON:  08/16/2014  FINDINGS: Right IJ catheter tip projects over the mid SVC. Endotracheal tube tip projects 3.7 cm proximal to the carina. NG tube descends below the level of the image. Other wires and tubes are presumably external. Left chest external  support devices obscure detailed evaluation. Heart size enlarged. Mediastinal contours otherwise within normal range. No definite consolidation, pleural effusion, or pneumothorax. Bilateral acromioclavicular DJD. No interval osseous change.  IMPRESSION: Support devices are appropriately positioned as above. No pneumothorax.  Mild cardiac enlargement.  No focal consolidation   Electronically Signed   By: Carlos Levering M.D.   On: 08/16/2014 23:03   Dg Chest Portable 1 View  08/16/2014   CLINICAL DATA:  Acute respiratory failure.  Atrial fibrillation.  EXAM: PORTABLE CHEST - 1 VIEW  COMPARISON:  02/14/2014  FINDINGS: Endotracheal tube is seen with tip approximately 6 cm above the carina.  Both lungs are clear. No evidence of pleural effusion. Mild cardiomegaly is stable.  IMPRESSION: Endotracheal tube in satisfactory position. No acute lung disease. Stable mild cardiomegaly.   Electronically Signed   By: Earle Gell M.D.   On: 08/16/2014 19:36   Dg Abd Portable 1v  08/21/2014   CLINICAL DATA:  Feeding tube placement.  EXAM: PORTABLE ABDOMEN - 1 VIEW  COMPARISON:  Single view of the abdomen 08/19/2014.  FINDINGS: Feeding tube is in place with the tip projecting inferiorly along the midbody of the stomach.  IMPRESSION: As above.   Electronically Signed   By: Inge Rise M.D.   On: 08/21/2014 12:53   Dg Abd Portable 1v  08/20/2014   CLINICAL DATA:  Feeding tube placement  EXAM: PORTABLE ABDOMEN - 1 VIEW  COMPARISON:  CT abdomen pelvis dated 06/14/2009  FINDINGS: Enteric tube terminates in the distal gastric antrum.  Nonspecific bowel gas pattern, but without disproportionate small bowel dilatation to suggest small bowel obstruction.  Degenerative changes of the lumbar spine.  IMPRESSION: Enteric tube terminates in the distal gastric antrum.   Electronically Signed   By: Julian Hy M.D.   On: 08/20/2014 00:36    CBC  Recent Labs Lab 08/25/14 0430 08/29/14 0650  WBC 9.8 12.5*  HGB 12.6* 12.5*    HCT 38.7* 37.7*  PLT 183 297  MCV 83.6 82.5  MCH 27.2 27.4  MCHC 32.6 33.2  RDW 17.8* 17.2*    Chemistries   Recent Labs Lab 08/24/14 0312 08/25/14 0430 08/26/14 0435 08/29/14 0650  NA 151* 148* 142 138  K 3.7 3.8 3.8 4.4  CL 112 110 104 100  CO2 29 29 29 29   GLUCOSE 124* 120* 113* 102*  BUN 24* 22 20 21   CREATININE 0.84 0.81 0.78 0.69  CALCIUM 8.4 8.7 8.7 9.1  AST  --  125*  --   --   ALT  --  147*  --   --   ALKPHOS  --  53  --   --   BILITOT  --  0.6  --   --    ------------------------------------------------------------------------------------------------------------------ estimated creatinine clearance is 103.7 ml/min (by C-G formula based on  Cr of 0.69). ------------------------------------------------------------------------------------------------------------------ No results found for this basename: HGBA1C,  in the last 72 hours ------------------------------------------------------------------------------------------------------------------ No results found for this basename: CHOL, HDL, LDLCALC, TRIG, CHOLHDL, LDLDIRECT,  in the last 72 hours ------------------------------------------------------------------------------------------------------------------ No results found for this basename: TSH, T4TOTAL, FREET3, T3FREE, THYROIDAB,  in the last 72 hours ------------------------------------------------------------------------------------------------------------------ No results found for this basename: VITAMINB12, FOLATE, FERRITIN, TIBC, IRON, RETICCTPCT,  in the last 72 hours  Coagulation profile No results found for this basename: INR, PROTIME,  in the last 168 hours  No results found for this basename: DDIMER,  in the last 72 hours  Cardiac Enzymes No results found for this basename: CK, CKMB, TROPONINI, MYOGLOBIN,  in the last 168 hours ------------------------------------------------------------------------------------------------------------------ No  components found with this basename: POCBNP,     Regalado, Belkys A D.O. on 08/30/2014 at 4:06 PM [phone] Between 7am to 7pm - Pager - [phone]  After 7pm go to [email or web address] - password [email or web address]  And look for the night coverage person covering for me after hours  [facility] Office  [phone]

## 2014-08-31 DIAGNOSIS — I469 Cardiac arrest, cause unspecified: Secondary | ICD-10-CM | POA: Diagnosis not present

## 2014-08-31 MED ORDER — SENNA 8.6 MG PO TABS: 1.0000 | ORAL_TABLET | Freq: Every day | ORAL | Status: AC

## 2014-08-31 MED ORDER — CHLORHEXIDINE GLUCONATE 0.12 % MT SOLN: 15.0000 mL | Freq: Two times a day (BID) | OROMUCOSAL | Status: AC

## 2014-08-31 MED ORDER — METOPROLOL TARTRATE 25 MG PO TABS: 25.0000 mg | ORAL_TABLET | Freq: Two times a day (BID) | ORAL | Status: DC

## 2014-08-31 MED ORDER — ALPRAZOLAM 1 MG PO TABS
1.0000 mg | ORAL_TABLET | Freq: Three times a day (TID) | ORAL | Status: DC | PRN
Start: 2014-08-31 — End: 2014-10-05

## 2014-08-31 MED ORDER — ENSURE PUDDING PO PUDG: 1.0000 | Freq: Three times a day (TID) | ORAL | Status: DC

## 2014-08-31 MED ORDER — ASPIRIN 325 MG PO TABS: 325.0000 mg | ORAL_TABLET | Freq: Every day | ORAL | Status: AC

## 2014-08-31 MED ORDER — ACETAMINOPHEN 325 MG PO TABS: 650.0000 mg | ORAL_TABLET | Freq: Four times a day (QID) | ORAL | Status: DC | PRN

## 2014-08-31 MED ORDER — FLUCONAZOLE 100 MG PO TABS: 100.0000 mg | ORAL_TABLET | Freq: Every day | ORAL | Status: DC

## 2014-08-31 NOTE — Progress Notes (Signed)
D/C orders received. Pt notified and verbalized understanding. Writer called report to [facility]. Pt dressed and belongings packed up. [facility] transported pt to [facility] along with his belongings.

## 2014-08-31 NOTE — Progress Notes (Signed)
Physical Therapy Treatment Patient Details Name: Anthony Mcclure MRN: [id number] DOB: 04/25/1951 Today's Date: 08/31/2014    History of Present Illness 63 yo adm 08/16/14 s/p cardiac arrest with 15-30 minute prior to return of spontaneous circulation (ROSC). Pt intubated and underwent hypothermia protocol. 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 MRI c-spine showed abnormal signal in the cord from the craniocervical junction through C3-4 level.  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 PMHx-CHF, hepatitis C, ETOH abuse, substance abuse, seizures, forearm reconstruction    PT Comments    Continues to work of sitting tolerance and truncal / muscle activation.  Compression assist (TED's and ABD binder) and foot positioners finally arrived before d/c  Follow Up Recommendations  SNF     Equipment Recommendations  None recommended by PT    Recommendations for Other Services       Precautions / Restrictions Precautions Precautions: Fall Precaution Comments: watch BP, signs of dyreflexias    Mobility  Bed Mobility Overal bed mobility: Needs Assistance;+2 for physical assistance;+ 2 for safety/equipment Bed Mobility: Supine to Sit;Sit to Supine     Supine to sit: Total assist;+2 for physical assistance;+2 for safety/equipment Sit to supine: Total assist;+2 for safety/equipment;+2 for physical assistance   General bed mobility comments: total dependence for all mobility  Transfers                 General transfer comment: Did not get OOB again due to pt too diaphoretic with just ACE Wrapping of legs,  Abdominal binder still not present  Ambulation/Gait                 Stairs            Wheelchair Mobility    Modified Rankin (Stroke Patients Only)       Balance Overall balance assessment: Needs assistance Sitting-balance support: Feet supported;Bilateral upper extremity supported Sitting balance-Leahy Scale: Zero Sitting balance - Comments: EOB  about 14 min working on sitting tolerance/truncal acitivation.  BP quickly dipped into the 80's /40's range, pt became diaphoretic and showed signs of feeling sluggish.  Tried propping up and moving legs passively with little positive results.                            Cognition Arousal/Alertness: Awake/alert Behavior During Therapy: WFL for tasks assessed/performed Overall Cognitive Status: Impaired/Different from baseline Area of Impairment: Following commands;Problem solving;Orientation       Following Commands: Follows one step commands inconsistently;Follows one step commands with increased time     Problem Solving: Slow processing General Comments: pt not reliable with yes/no and runs on verbally with each question     Exercises Other Exercises Other Exercises: PROM to all 4's    General Comments        Pertinent Vitals/Pain Pain Assessment: Faces Faces Pain Scale: No hurt    Home Living                      Prior Function            PT Goals (current goals can now be found in the care plan section) Acute Rehab PT Goals Patient Stated Goal: uanble to state/communicate PT Goal Formulation: Patient unable to participate in goal setting Time For Goal Achievement: 09/04/14 Potential to Achieve Goals: Good Progress towards PT goals: Not progressing toward goals - comment (having trouble progressing due to lack of compression aides)  Frequency  Min 3X/week    PT Plan Current plan remains appropriate    Co-evaluation             End of Session   Activity Tolerance: Patient tolerated treatment well;Other (comment) (shortened by falling BP's) Patient left: in bed;with call bell/phone within reach     Time: 1313-1336 PT Time Calculation (min): 23 min  Charges:  $Therapeutic Activity: 23-37 mins                    G Codes:      Shavette Shoaff, Tessie Fass 08/31/2014, 4:31 PM

## 2014-08-31 NOTE — Discharge Summary (Signed)
Physician Discharge Summary  Anthony Mcclure OFB:[id number] DOB: 10-11-1951 DOA: 08/16/2014  PCP: [name], MD  Admit date: 08/16/2014 Discharge date: 08/31/2014  Time spent: 35 minutes  Recommendations for Outpatient Follow-up:  1. Need continue PT  2. Aspiration precaution.  3. Further discussion with palliative  Discharge Diagnoses:    Cardiac arrest   PEA (Pulseless electrical activity)   Quadriplegia   Spinal cord infarction   Dehydration with hypernatremia   Alcohol abuse   Paroxysmal Atrial Flutter   Hepatitis C   HTN (hypertension)   Acute respiratory failure with hypoxia   Dysphagia   Protein calorie malnutrition   Discharge Condition: Stable.   Diet recommendation: Dysphagia 1 Diet.   Filed Weights   08/29/14 0519 08/30/14 0900 08/31/14 0558  Weight: 93.849 kg (206 lb 14.4 oz) 88.542 kg (195 lb 3.2 oz) 86.456 kg (190 lb 9.6 oz)    History of present illness:  [name] brought to ED on 9/3 after he suffered an out of hospital cardiac arrest. Arrest was witnessed; however, no CPR was performed until 10-15 minutes later when EMS arrived. Upon their arrival, rhythm was noted to be PEA. ACLS was performed for 15 minutes before ROSC. During resuscitation, pt received 2 of epi and 1amp D50. In ED, pt was comatose and was intubated for respiratory failure. PCCM was consulted for admission and initiation of hypothermia protocol.   Hospital Course:  HPI/Interim history  [name] brought to ED on 9/3 after he suffered an out of hospital cardiac arrest. Arrest was witnessed; however, no CPR was performed until 10-15 minutes later when EMS arrived. Upon their arrival, rhythm was noted to be PEA. ACLS was performed for 15 minutes before ROSC. During resuscitation, pt received 2 of epi and 1amp D50. In ED, pt was comatose and was intubated for respiratory failure. PCCM was consulted for admission and initiation of hypothermia protocol.  Postarrest EKG and enzymes were  negative and not consistent with ischemic etiology to the arrest. Patient eventually stabilized and hypothermia was reversed and patient was able to be extubated. After regaining consciousness patient was unable to move his extremities. MRI of his brain was unremarkable no acute normality. MRI of the cervical spine showed abnormal increased T2 signal in the cervical cord extending from the craniocervical junction through the C3/4 level possibly from cord infarction/cord edema or chronic myelomalacia. Neurology was consulted. Patient underwent EEG on 9/5 that demonstrated generalized slowing consistent with encephalopathic state but otherwise was unremarkable.  Dr. Vertell Limber in neurosurgery who reviewed the images and stated he did not see any active compression and therefore did not feel there was any role for acute surgery or steroids. Neurology was also concerned about his mental status and documented there may be some sort of hypoxic brain injury there as well.  Patient has been evaluated by OT, PT and speech therapy. He was evaluated by CIR but was deemed not an appropriate candidate for inpatient stay at this time. Recommendation is to either pursue skilled nursing facility. [facility] has taken over care of this patient as of 08/24/14.  Patient is not a candidate for LTAC. Palliative care has been consulted, meeting with patient's cousin for 08/31/2014. Pending further recommendations from palliative care. Dysphagia 1 diet started. panda tube was removed 9-15. Started on ensure for nutrition support.   Cardiac arrest/PEA  -Based on cardiac enzymes and EKG does not appear to be ischemic in etiology-unclear what precipitated the initial event   Acute respiratory failure  with hypoxia/post arrest pulmonary edema/MRSA pneumonia  -Repeat CXR negative. Lungs CTA.  -Vancomycin discontinued   Dysphagia  - Patient was started on dysphagia 1 diet. Per nurse he ate 100 % breakfast.  -will consult  nutritionist, for nutrition requirement. Started on ensure.  - Per SW guardians ship not needed, as brother and cousin will be making decision.   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  -MRI repeated 9-17: Decreased cephalo caudad extent of T2 signal abnormality at the C1-2 level. There is no diffusion abnormality. The findings are most compatible with acute trauma.  -Spoke with Dr Lodema Pilot no further recommendation regarding MRI finding.   Dehydration with hypernatremia  -Resolved. Continue free water   Alcohol abuse/ Hepatitis C  Hypertension  -Stable, continue metoprolol  Protein calorie malnutrition  -dysphagia diet. Nutritionist consulted.  Urinary retention: continue foley  Oral candidiasis: Continue with fluconazole.   Procedures: 9/3 Admitted with cardiac arrest, hypothermia started  9/3 CT Head / C-Spine >>> nad  9/4 TTE >>> EF 55%  9/5 Rewarming finished  9/5 EEG >>> nonspecific cerebral dsyfxn, no seizures  9/6 New finding of quadriparesis, MRI brain / C-spine ordered  9/7 MRI brain/C Spine: abn signal to cervical cord C3-4 ? Possible cord infarct or edema . Brain neg for acute process.  9/7 Passed SBT. Strong cough when suctioned. Extubated.  9/14 Palliative care meeting  9/15 started on dysphagia 1 diet   Consultations:  Neurology.   Discharge Exam: Filed Vitals:   08/31/14 0950  BP: 121/69  Pulse: 66  Temp: 98.4 F (36.9 C)  Resp: 18    General: Alert, in no distress.  Cardiovascular: S 1, S 2 RRR Respiratory: Bilateral ronchus.   Discharge Instructions You were cared for by a hospitalist during your hospital stay. If you have any questions about your discharge medications or the care you received while you were in the hospital after you are discharged, you can call the unit and asked to speak with the hospitalist on  call if the hospitalist that took care of you is not available. Once you are discharged, your primary care physician will handle any further medical issues. Please note that NO REFILLS for any discharge medications will be authorized once you are discharged, as it is imperative that you return to your primary care physician (or establish a relationship with a primary care physician if you do not have one) for your aftercare needs so that they can reassess your need for medications and monitor your lab values.   Current Discharge Medication List    START taking these medications   Details  acetaminophen (TYLENOL) 325 MG tablet Take 2 tablets (650 mg total) by mouth every 6 (six) hours as needed for fever. Qty: 30 tablet, Refills: 0    ALPRAZolam (XANAX) 1 MG tablet Take 1 tablet (1 mg total) by mouth 3 (three) times daily as needed for anxiety or sleep (agitation). Qty: 15 tablet, Refills: 0    aspirin 325 MG tablet Take 1 tablet (325 mg total) by mouth daily. Qty: 30 tablet, Refills: 0    chlorhexidine (PERIDEX) 0.12 % solution 15 mLs by Mouth Rinse route 2 (two) times daily. Qty: 120 mL, Refills: 0    feeding supplement, ENSURE, (ENSURE) PUDG Take 1 Container by mouth 3 (three) times daily between meals. Qty: 30 Can, Refills: 0    fluconazole (DIFLUCAN)  100 MG tablet Take 1 tablet (100 mg total) by mouth daily. Qty: 5 tablet, Refills: 0    senna (SENOKOT) 8.6 MG TABS tablet Take 1 tablet (8.6 mg total) by mouth at bedtime. Qty: 120 each, Refills: 0      CONTINUE these medications which have CHANGED   Details  metoprolol tartrate (LOPRESSOR) 25 MG tablet Take 1 tablet (25 mg total) by mouth 2 (two) times daily. Qty: 30 tablet, Refills: 0      STOP taking these medications     verapamil (CALAN) 120 MG tablet      meloxicam (MOBIC) 15 MG tablet        No Known Allergies    The results of significant diagnostics from this hospitalization (including imaging, microbiology,  ancillary and laboratory) are listed below for reference.    Significant Diagnostic Studies: Ct Head Wo Contrast  08/16/2014   CLINICAL DATA:  CPR  EXAM: CT HEAD WITHOUT CONTRAST  CT MAXILLOFACIAL WITHOUT CONTRAST  TECHNIQUE: Multidetector CT imaging of the head and cervical spine structures were performed using the standard protocol without intravenous contrast. Multiplanar CT image reconstructions of the maxillofacial structures were also generated.  COMPARISON:  10/24/2010 and 06/14/2009  FINDINGS: CT HEAD FINDINGS  Stable left frontal soft tissue lipoma over the periosteum.  Global atrophy and chronic ischemic changes are stable. No mass effect, midline shift, or acute intracranial hemorrhage. Mastoid air cells are clear. Minimal mucosal thickening in the ethmoid air cells. Old fracture of the medial wall of the left orbit.  CT CERVICAL SPINE FINDINGS  No acute fracture. No dislocation. 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 There is multilevel spinal stenosis. No obvious spinal hematoma. No obvious soft tissue injury. Endotracheal tube is in place.  IMPRESSION: No acute intracranial pathology.  No evidence of acute cervical spine injury. Advanced degenerative changes are noted.   Electronically Signed   By: Maryclare Bean M.D.   On: 08/16/2014 19:59   Ct Cervical Spine Wo Contrast  08/16/2014   CLINICAL DATA:  CPR  EXAM: CT HEAD WITHOUT CONTRAST  CT MAXILLOFACIAL WITHOUT CONTRAST  TECHNIQUE: Multidetector CT imaging of the head and cervical spine structures were performed using the standard protocol without intravenous contrast. Multiplanar CT image reconstructions of the maxillofacial structures were also generated.  COMPARISON:  10/24/2010 and 06/14/2009  FINDINGS: CT HEAD FINDINGS  Stable left frontal soft tissue lipoma over the periosteum.  Global atrophy and chronic ischemic changes are stable. No mass effect, midline shift, or acute intracranial hemorrhage. Mastoid air  cells are clear. Minimal mucosal thickening in the ethmoid air cells. Old fracture of the medial wall of the left orbit.  CT CERVICAL SPINE FINDINGS  No acute fracture. No dislocation. 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 There is multilevel spinal stenosis. No obvious spinal hematoma. No obvious soft tissue injury. Endotracheal tube is in place.  IMPRESSION: No acute intracranial pathology.  No evidence of acute cervical spine injury. Advanced degenerative changes are noted.   Electronically Signed   By: Maryclare Bean M.D.   On: 08/16/2014 19:59   Mr Jeri Cos EU Contrast  08/19/2014   CLINICAL DATA:  63 year old hypertensive male with history of alcohol abuse, alcohol withdrawal seizures, hepatitis and atrial flutter. Found down and received CPR. Unable to move upper extremities post CPR. Old head injury.  EXAM: MRI HEAD WITHOUT AND WITH CONTRAST  TECHNIQUE: Multiplanar, multiecho pulse sequences of the brain and surrounding structures  were obtained without and with intravenous contrast.  CONTRAST:  20 cc MultiHance.  COMPARISON:  MR cervical spine performed the same date and dictated separately.  Head CT 08/16/2014.  No comparison brain MR.  FINDINGS: Some the sequences are motion degraded.  No acute infarct  No intracranial hemorrhage.  Global atrophy. Ventricular size may be related to atrophy rather than hydrocephalus.  No intracranial mass or abnormal enhancement.  No MR evidence of Wernicke's encephalopathy.  Major intracranial vascular structures are patent.  Abnormal appearance of the upper cervical spine better delineated on recent cervical spine MR. Please see report from such. An additional consideration is that the patient had a traumatic event (such as a fall) which caused cord contusion contributing to patient's inability to move extremities. Subacute combined degeneration/ vitamin D deficiency contributing to the cervical cord changes although possible given the  patient's history of alcohol use is felt unlikely as cause of the current findings.  Prominent pannus.  This contributes to baseline spinal stenosis.  Left frontal scalp lipoma.  IMPRESSION: Some the sequences are motion degraded.  No acute infarct  No intracranial hemorrhage.  Global atrophy. Ventricular size may be related to atrophy rather than hydrocephalus.  No intracranial mass or abnormal enhancement.  No MR evidence of Wernicke's encephalopathy.  Abnormal appearance of the upper cervical spine better delineated on recent cervical spine MR. Please see report from such. An additional consideration is that the patient had a traumatic event (such as a fall) which caused cord contusion contributing to patient's inability to move extremities.   Electronically Signed   By: Chauncey Cruel M.D.   On: 08/19/2014 17:56   Mr Cervical Spine W Wo Contrast  08/29/2014   CLINICAL DATA:  Cervical spine trauma versus infarct. Unable to move his arms. The patient has mobility of his lower extremity.  EXAM: MRI CERVICAL SPINE WITHOUT AND WITH CONTRAST  TECHNIQUE: Multiplanar and multiecho pulse sequences of the cervical spine, to include the craniocervical junction and cervicothoracic junction, were obtained according to standard protocol without and with intravenous contrast.  CONTRAST:  86mL MULTIHANCE GADOBENATE DIMEGLUMINE 529 MG/ML IV SOLN  COMPARISON:  MRI of the cervical spine without and with contrast 08/19/2014.  FINDINGS: Marked pannus is again noted posterior to C1-2 with narrowing of the dens, compatible with rheumatoid arthritis. This results in focal narrowing of the spinal canal.  Diffuse cord signal abnormality is again noted within the cord at this level. The cephalo caudad extent of cord signal abnormality has diminished. Diffusion-weighted imaging was done through this areas well without evidence for restricted diffusion.  Postcontrast imaging demonstrates no focal enhancement of the cord. There is dural  enhancement is previously-seen. There is also enhancement within the dens. There is also some enhancement of the posterior pannus.  Multilevel degenerative changes of the cervical spine are otherwise stable. Chronic marrow signal changes at C7-T1 are stable. No other focal marrow signal abnormality is present.  IMPRESSION: 1. Decreased cephalo caudad extent of T2 signal abnormality at the C1-2 level. There is no diffusion abnormality. The findings are most compatible with acute trauma. Myelomalacia is considered less likely given normal overall size of the cord. 2. Abnormal marrow signal and enhancement within the dens. This may be related to the arthritic changes. Posttraumatic marrow changes are also considered. There is no definite displaced fracture. 3. Multilevel spondylosis of the cervical spine is otherwise stable.   Electronically Signed   By: Lawrence Santiago M.D.   On: 08/29/2014 19:00  Mr Cervical Spine W Wo Contrast  08/19/2014   CLINICAL DATA:  Found down hypothermic, received CPR. Pneumonia. Not moving upper extremities. Ventilator. Sedated.  EXAM: MRI CERVICAL SPINE WITHOUT AND WITH CONTRAST  TECHNIQUE: Multiplanar and multiecho pulse sequences of the cervical spine, to include the craniocervical junction and cervicothoracic junction, were obtained according to standard protocol without and with intravenous contrast.  CONTRAST:  54mL MULTIHANCE GADOBENATE DIMEGLUMINE 529 MG/ML IV SOLN  COMPARISON:  08/16/2014  FINDINGS: Despite efforts by the technologist and patient, motion artifact is present on today's exam and could not be eliminated. This reduces exam sensitivity and specificity.  Chronic degenerative findings and pannus posterior to the odontoid with likely chronic erosion of the odontoid, causing exaggerated curvature of the thecal sac at the craniocervical junction. Low-grade enhancement surrounds this pannus. Abnormal but low grade osseous edema noted anteriorly in C1 and C2.  The patient is  orally intubated.  Abnormal increased T2 signal in the cord extends from the skull base through the C3-4 level, especially centrally, and most severely at the C1 vertebral level. No associated enhancement in the cord to suggest that this is due to high-grade tumor. Gradient images are severely limited by motion artifact.  There is reversal of the normal cervical lordosis with loss of intervertebral disc height at C6-7 and C7-T 1. Multilevel degenerative endplate findings especially at C7-T1. Diffuse low-grade third spacing of fluid in the neck.  Congenitally [place] pedicles in the upper cervical spine noted.  Additional findings at individual levels are as follows:  C1-2: Pannus and exaggerated curvature at the cervicothoracic junction cause moderate central narrowing of the thecal sac. Spurring from C1 causes at least mild bilateral foraminal stenosis, and there is a posterior fusion of the left C1-2 articulation.  C2-3: Prominent left and moderate right foraminal stenosis with moderate central narrowing of the thecal sac due to posterior osseous ridging, disc bulge, and facet arthropathy.  C3-4: Prominent left and moderate right foraminal stenosis with mild central narrowing of the thecal sac due to uncinate spurring facet arthropathy, and disc bulge.  C4-5: Moderate right and mild left foraminal stenosis due to uncinate and facet spurring.  C5-6: Moderate left foraminal stenosis due to facet arthropathy and uncinate spurring.  C6-7: Moderate bilateral foraminal stenosis due to uncinate and facet spurring along with mild disc bulge.  C7-T1: Prominent bilateral foraminal stenosis due to posterior osseous ridging, uncinate spurring, and facet arthropathy.  IMPRESSION: 1. Abnormal signal in the cervical cord extending from the craniocervical junction through the C3-4 level, possibly from cord infarct, cord edema, or chronic myelomalacia. No associated cord enhancement to suggest high-grade tumor. 2. Chronic  degenerative findings at C1-2 and C2-3 with considerable pannus posterior to the odontoid, causing exaggerated curvature of the cord and central narrowing of the thecal sac. 3. Cervical spondylosis and degenerative disc disease is also present. Overall associated impingement is graded as prominent at C2-3, C3-4, and C7-T1; and moderate at C1-2, C4-5, C5-6, and C6-7, as detailed above.   Electronically Signed   By: Sherryl Barters M.D.   On: 08/19/2014 16:55   Dg Chest Port 1 View  08/28/2014   CLINICAL DATA:  Interval fevers, cough.  Thick mucus.  EXAM: PORTABLE CHEST - 1 VIEW  COMPARISON:  Chest radiograph 08/25/2014  FINDINGS: Stable cardiomegaly. Stable mediastinal and hilar contours. Trachea midline. Normal pulmonary vascularity. Lung volumes are normal in the lungs are clear. No airspace disease, effusion, or pneumothorax is visualized. There typical degenerative changes of thoracic spine.  Visualized upper abdomen is unremarkable.  IMPRESSION: Stable cardiomegaly.  No acute cardiopulmonary disease identified.   Electronically Signed   By: Curlene Dolphin M.D.   On: 08/28/2014 17:23   Dg Chest Port 1 View  08/25/2014   CLINICAL DATA:  Dyspnea  EXAM: PORTABLE CHEST - 1 VIEW  COMPARISON:  08/22/2014  FINDINGS: Mild to moderate cardiac enlargement stable. Feeding tube crosses the gastroesophageal junction and right internal jugular central line also in unchanged position.  Vascular pattern is normal. No consolidation or effusion. Mild residual left lower lobe atelectasis.  IMPRESSION: No active disease.   Electronically Signed   By: Skipper Cliche M.D.   On: 08/25/2014 12:26   Dg Chest Port 1 View  08/22/2014   CLINICAL DATA:  Shortness of breath.  Evaluate atelectasis  EXAM: PORTABLE CHEST - 1 VIEW  COMPARISON:  08/21/2014; 08/20/2014; 08/19/2014  FINDINGS: Grossly unchanged enlarged cardiac silhouette. Bilateral infrahilar heterogeneous opacities are unchanged. No new focal airspace opacities. No evidence  of edema. No pleural effusion. A skin fold overlies the right upper lobe. No pleural effusion. Unchanged bones.  IMPRESSION: 1. Grossly unchanged bilateral infrahilar heterogeneous opacities, likely atelectasis. 2. A skin fold overlies the right upper lung. No definite pneumothorax. Further evaluation could be performed with PA and lateral chest radiograph as clinically indicated.   Electronically Signed   By: Sandi Mariscal M.D.   On: 08/22/2014 07:47   Dg Chest Port 1 View  08/21/2014   CLINICAL DATA:  Extubated patient.  EXAM: PORTABLE CHEST - 1 VIEW  COMPARISON:  08/20/2014  FINDINGS: Since the prior study, the endotracheal tube and nasogastric tube have been removed. The right internal jugular central venous catheter remains unchanged.  Right lung base opacity has improved. No new areas of lung opacity. No pulmonary edema. No pleural effusion or pneumothorax on this semi-erect study.  Cardiac silhouette is normal in size. No mediastinal or hilar masses.  IMPRESSION: 1. Improved right lung base opacity consistent with an improved infiltrate or improved atelectasis. 2. Status post extubation and removal of the nasogastric tube.   Electronically Signed   By: Lajean Manes M.D.   On: 08/21/2014 07:37   Dg Chest Port 1 View  08/20/2014   CLINICAL DATA:  Acute respiratory failure. On ventilator. Status post cardiac arrest.  EXAM: PORTABLE CHEST - 1 VIEW  COMPARISON:  08/19/2014  FINDINGS: Mild cardiomegaly is stable. Increased opacity is seen in the right lower lobe silhouetting the right hemidiaphragm. No evidence of pleural effusion or pneumothorax. Support lines and tubes in appropriate position.  IMPRESSION: Increased right lower lobe infiltrate.  Stable mild cardiomegaly.   Electronically Signed   By: Earle Gell M.D.   On: 08/20/2014 09:15   Dg Chest Port 1 View  08/19/2014   CLINICAL DATA:  Intubated. Congestive heart failure. Alcohol abuse.  EXAM: PORTABLE CHEST - 1 VIEW  COMPARISON:  One day prior   FINDINGS: Endotracheal tube unchanged with tip 3.5 cm above carina. Nasogastric tube extends beyond the inferior aspect of the film. Right internal jugular line tip mid SVC.  Mild cardiomegaly. No pleural effusion or pneumothorax. Improved to resolved left base airspace disease. Developing right base opacity.  IMPRESSION: Decreased to resolved left base airspace disease. New developing right base airspace disease. Favor shifting atelectasis. Early right-sided infection cannot be excluded.  Cardiomegaly without congestive failure.   Electronically Signed   By: Abigail Miyamoto M.D.   On: 08/19/2014 07:38   Dg Chest [facility]  08/18/2014   CLINICAL DATA:  Endotracheal to  EXAM: PORTABLE CHEST - 1 VIEW  COMPARISON:  Yesterday  FINDINGS: Tubular device is stable. No pneumothorax. Normal heart size. Increasing left basilar airspace disease.  IMPRESSION: Increasing left basilar airspace disease.   Electronically Signed   By: Maryclare Bean M.D.   On: 08/18/2014 07:34   Portable Chest Xray In Am  08/17/2014   CLINICAL DATA:  Reassess support tube placement.  EXAM: PORTABLE CHEST - 1 VIEW  COMPARISON:  Portable chest x-ray of August 16, 2014  FINDINGS: The lungs are reasonably well inflated and clear. The cardiac silhouette is mildly enlarged though stable. A external pacing -defibrillator pads are present. The pulmonary vascularity is not engorged. The endotracheal tube tip lies 3.9 cm above the crotch of the carina. The esophagogastric tube tip lies in the gastric cardia. The proximal port likely lies at or above the GE junction. The right internal jugular venous catheter tip lies in the midportion of the SVC.  IMPRESSION: 1. There is no evidence of pneumonia nor pulmonary edema. 2. The endotracheal tube and internal jugular catheter are in appropriate position. Advancement of the nasogastric tube by 5-10 cm is recommended to assure that the proximal port is below the GE junction.   Electronically Signed   By: David   [place]   On: 08/17/2014 07:25   Dg Chest Port 1 View  08/16/2014   CLINICAL DATA:  line placement  EXAM: PORTABLE CHEST - 1 VIEW  COMPARISON:  08/16/2014  FINDINGS: Right IJ catheter tip projects over the mid SVC. Endotracheal tube tip projects 3.7 cm proximal to the carina. NG tube descends below the level of the image. Other wires and tubes are presumably external. Left chest external support devices obscure detailed evaluation. Heart size enlarged. Mediastinal contours otherwise within normal range. No definite consolidation, pleural effusion, or pneumothorax. Bilateral acromioclavicular DJD. No interval osseous change.  IMPRESSION: Support devices are appropriately positioned as above. No pneumothorax.  Mild cardiac enlargement.  No focal consolidation   Electronically Signed   By: Carlos Levering M.D.   On: 08/16/2014 23:03   Dg Chest Portable 1 View  08/16/2014   CLINICAL DATA:  Acute respiratory failure.  Atrial fibrillation.  EXAM: PORTABLE CHEST - 1 VIEW  COMPARISON:  02/14/2014  FINDINGS: Endotracheal tube is seen with tip approximately 6 cm above the carina.  Both lungs are clear. No evidence of pleural effusion. Mild cardiomegaly is stable.  IMPRESSION: Endotracheal tube in satisfactory position. No acute lung disease. Stable mild cardiomegaly.   Electronically Signed   By: Earle Gell M.D.   On: 08/16/2014 19:36   Dg Abd Portable 1v  08/21/2014   CLINICAL DATA:  Feeding tube placement.  EXAM: PORTABLE ABDOMEN - 1 VIEW  COMPARISON:  Single view of the abdomen 08/19/2014.  FINDINGS: Feeding tube is in place with the tip projecting inferiorly along the midbody of the stomach.  IMPRESSION: As above.   Electronically Signed   By: Inge Rise M.D.   On: 08/21/2014 12:53   Dg Abd Portable 1v  08/20/2014   CLINICAL DATA:  Feeding tube placement  EXAM: PORTABLE ABDOMEN - 1 VIEW  COMPARISON:  CT abdomen pelvis dated 06/14/2009  FINDINGS: Enteric tube terminates in the distal gastric antrum.   Nonspecific bowel gas pattern, but without disproportionate small bowel dilatation to suggest small bowel obstruction.  Degenerative changes of the lumbar spine.  IMPRESSION: Enteric tube terminates in the distal gastric antrum.   Electronically Signed  By: Julian Hy M.D.   On: 08/20/2014 00:36    Microbiology: Recent Results (from the past 240 hour(s))  CLOSTRIDIUM DIFFICILE BY PCR     Status: None   Collection Time    08/23/14  1:30 PM      Result Value Ref Range Status   C difficile by pcr NEGATIVE  NEGATIVE Final  URINE CULTURE     Status: None   Collection Time    08/25/14  4:45 AM      Result Value Ref Range Status   Specimen Description URINE, CATHETERIZED   Final   Special Requests NONE   Final   Culture  Setup Time     Final   Value: 08/25/2014 16:02     Performed at [facility]     Final   Value: NO GROWTH     Performed at [facility]   Culture     Final   Value: NO GROWTH     Performed at [facility]   Report Status 08/26/2014 FINAL   Final     Labs: Basic Metabolic Panel:  Recent Labs Lab 08/25/14 0430 08/26/14 0435 08/29/14 0650  NA 148* 142 138  K 3.8 3.8 4.4  CL 110 104 100  CO2 29 29 29   GLUCOSE 120* 113* 102*  BUN 22 20 21   CREATININE 0.81 0.78 0.69  CALCIUM 8.7 8.7 9.1   Liver Function Tests:  Recent Labs Lab 08/25/14 0430  AST 125*  ALT 147*  ALKPHOS 53  BILITOT 0.6  PROT 6.3  ALBUMIN 2.4*   No results found for this basename: LIPASE, AMYLASE,  in the last 168 hours No results found for this basename: AMMONIA,  in the last 168 hours CBC:  Recent Labs Lab 08/25/14 0430 08/29/14 0650  WBC 9.8 12.5*  HGB 12.6* 12.5*  HCT 38.7* 37.7*  MCV 83.6 82.5  PLT 183 297   Cardiac Enzymes: No results found for this basename: CKTOTAL, CKMB, CKMBINDEX, TROPONINI,  in the last 168 hours BNP: BNP (last 3 results)  Recent Labs  02/14/14 1145 08/25/14 0430  PROBNP 1322.0* 243.9*    CBG:  Recent Labs Lab 08/28/14 1122 08/28/14 1734 08/28/14 2014 08/29/14 0007 08/29/14 0522  GLUCAP 125* 126* 134* 117* 114*       Signed:  Jocee Kissick A  [facility] 08/31/2014, 11:40 AM

## 2014-09-03 ENCOUNTER — Non-Acute Institutional Stay (SKILLED_NURSING_FACILITY): Payer: Medicare Other | Admitting: Internal Medicine

## 2014-09-03 DIAGNOSIS — E46 Unspecified protein-calorie malnutrition: Secondary | ICD-10-CM

## 2014-09-03 DIAGNOSIS — I10 Essential (primary) hypertension: Secondary | ICD-10-CM

## 2014-09-03 DIAGNOSIS — G8250 Quadriplegia, unspecified: Secondary | ICD-10-CM

## 2014-09-03 DIAGNOSIS — K5900 Constipation, unspecified: Secondary | ICD-10-CM

## 2014-09-03 NOTE — Progress Notes (Signed)
HISTORY & PHYSICAL  DATE: 09/03/2014   FACILITY: [facility]  LEVEL OF CARE: SNF (31)  ALLERGIES:  No Known Allergies  CHIEF COMPLAINT:  Manage hypertension, constipation and quadriplegia  HISTORY OF PRESENT ILLNESS: 63 year old African American male was hospitalized secondary to cardiac arrest. After hospitalization he admitted to this facility for long-term care and management. Patient is a poor historian.  HTN: Pt 's HTN remains stable.  Staff Deny CP, sob, DOE, pedal edema.  No complications from the medications currently being used.  Last BP : 126/70.  CONSTIPATION: The constipation remains stable. No complications from the medications presently being used. Staff deny ongoing constipation, abdominal pain, nausea or vomiting.  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 Staff reports that he is frequently sweating.  PAST MEDICAL HISTORY :  Past Medical History  Diagnosis Date  . CHF (congestive heart failure)   . Alcohol abuse   . Seizure   . Noncompliance with medication treatment due to intermittent use of medication 02/02/2012  . HTN (hypertension) 02/02/2012  . Alcohol withdrawal seizure 02/02/2012  . Hypertension   . Dysrhythmia     paroxismal atrial fib  . Paroxysmal Atrial Flutter 02/01/2012  . Hepatitis C 02/02/2012    Unsure if he was ever diagnosed with Hep C  . Hepatitis C     PAST SURGICAL HISTORY: Past Surgical History  Procedure Laterality Date  . Forearm reconstruction      SOCIAL HISTORY:  reports that he has never smoked. He has never used smokeless tobacco. He reports that he drinks alcohol. He reports that he uses illicit drugs ("Crack" cocaine).  FAMILY HISTORY:  Family History  Problem Relation Age of Onset  . Heart disease Father   . Alcohol abuse Father   . Alcohol abuse Brother   . Diabetes type II Mother     CURRENT MEDICATIONS: Reviewed per MAR/see medication  list  REVIEW OF SYSTEMS: Unobtainable-patient is a poor historian  PHYSICAL EXAMINATION  VS:  See VS section  GENERAL: no acute distress, normal body habitus EYES: conjunctivae normal, sclerae normal, normal eye lids MOUTH/THROAT: Unable to assess NECK: supple, trachea midline, no neck masses, no thyroid tenderness, no thyromegaly LYMPHATICS: no LAN in the neck, no supraclavicular LAN RESPIRATORY: breathing is even & unlabored, BS CTAB CARDIAC: RRR, no murmur,no extra heart sounds, no edema GI:  ABDOMEN: abdomen soft, no BS, no masses, no tenderness  LIVER/SPLEEN: no hepatomegaly, no splenomegaly MUSCULOSKELETAL: HEAD: normal to inspection  EXTREMITIES: Unable to assess PSYCHIATRIC: the patient is alert & unable to assess orientation, affect & behavior appropriate  LABS/RADIOLOGY:  Labs reviewed: Basic Metabolic Panel:  Recent Labs  08/19/14 0441  08/21/14 0300 08/22/14 0635 08/23/14 0650  08/25/14 0430 08/26/14 0435 08/29/14 0650  NA 141  < > 143 148* 149*  < > 148* 142 138  K 4.2  < > 4.9 3.6* 3.7  < > 3.8 3.8 4.4  CL 108  < > 107 109 110  < > 110 104 100  CO2 22  < > 25 29 30   < > 29 29 29   GLUCOSE 153*  < > 93 133* 129*  < > 120* 113* 102*  BUN 16  < > 14 19 24*  < > 22 20 21   CREATININE 0.92  < > 0.77 0.81 0.93  < > 0.81 0.78 0.69  CALCIUM 7.5*  < > 8.1* 8.8 8.4  < >  8.7 8.7 9.1  MG 1.5  < > 2.1 1.9 2.1  --   --   --   --   PHOS 3.1  --   --  3.3 2.8  --   --   --   --   < > = values in this interval not displayed. Liver Function Tests:  Recent Labs  08/19/14 0441 08/20/14 0541 08/25/14 0430  AST 120* 85* 125*  ALT 110* 84* 147*  ALKPHOS 64 62 53  BILITOT 1.1 1.2 0.6  PROT 6.4 6.4 6.3  ALBUMIN 2.7* 2.7* 2.4*   CBC:  Recent Labs  02/14/14 1145  08/18/14 0419  08/21/14 0300 08/25/14 0430 08/29/14 0650  WBC 7.2  < > 7.4  < > 11.7* 9.8 12.5*  NEUTROABS 4.6  --  6.3  --   --   --   --   HGB 15.7  < > 14.3  < > 12.4* 12.6* 12.5*  HCT 44.9  < >  40.9  < > 37.3* 38.7* 37.7*  MCV 81.2  < > 79.6  < > 82.9 83.6 82.5  PLT 152  < > 144*  < > 128* 183 297  < > = values in this interval not displayed.  Lipid Panel:  Recent Labs  08/21/14 0300  HDL 63   Cardiac Enzymes:  Recent Labs  01/19/14 0018  08/16/14 2250 08/17/14 0330 08/17/14 0915  CKTOTAL 1064*  --   --   --   --   TROPONINI  --   < > <0.30 <0.30 <0.30  < > = values in this interval not displayed.  CBG:  Recent Labs  08/28/14 2014 08/29/14 0007 08/29/14 0522  GLUCAP 134* 117* 114*    Transthoracic Echocardiography  Patient:    Anthony Mcclure, Anthony Mcclure MR #:       [id number] Study Date: 08/17/2014 Gender:     M Age:        89 Height:     182.9 cm Weight:     91.2 kg BSA:        2.17 m^2 Pt. Status: Room:       [facility]   ADMITTING    [place], [place], [place]  SONOGRAPHER  Tresa Res, RDCS  PERFORMING   [facility], Inpatient  ORDERING     Shearon Stalls, Rahul P  REFERRING    Shearon Stalls, Rahul P  cc:  ------------------------------------------------------------------- LV EF: 50% -   55%  ------------------------------------------------------------------- Indications:      Cardiac arrest 427.5.  ------------------------------------------------------------------- History:   Risk factors:  Alcohol Abuse. Rhabdomyolysis. Hypertension.  ------------------------------------------------------------------- Study Conclusions  - Left ventricle: Inferobasal hypokinesis. The cavity size was   mildly dilated. Wall thickness was normal. Systolic function was   normal. The estimated ejection fraction was in the range of 50%   to 55%. - Aorta: Moderate dilatation of the sinuses and ascending root No   signs of discection suggest CT correlation. - Atrial septum: No defect or patent foramen ovale was identified.  Transthoracic echocardiography.  M-mode, complete 2D, spectral Doppler, and color Doppler.  Birthdate:  Patient birthdate: 15-Jan-1951.  Age:   Patient is 63 yr old.  Sex:  Gender: male. BMI: 27.3 kg/m^2.  Blood pressure:     140/102  Patient status: Inpatient.  Study date:  Study date: 08/17/2014. Study time: 12:39 PM.  Location:  ICU/CCU  -------------------------------------------------------------------  ------------------------------------------------------------------- Left ventricle:  Inferobasal hypokinesis. The cavity size was mildly dilated. Wall thickness  was normal. Systolic function was normal. The estimated ejection fraction was in the range of 50% to 55%.  ------------------------------------------------------------------- Aortic valve:   Mildly thickened leaflets.  Doppler:   There was no stenosis.  ------------------------------------------------------------------- Aorta:  Moderate dilatation of the sinuses and ascending root No signs of discection suggest CT correlation.  ------------------------------------------------------------------- Mitral valve:   Mildly thickened leaflets .  Doppler:  There was trivial regurgitation.  ------------------------------------------------------------------- Left atrium:  The atrium was normal in size.  ------------------------------------------------------------------- Atrial septum:  No defect or patent foramen ovale was identified.   ------------------------------------------------------------------- Right ventricle:  The cavity size was normal. Wall thickness was normal. Systolic function was normal.  ------------------------------------------------------------------- Pulmonic valve:    Structurally normal valve.   Cusp separation was normal.  Doppler:  Transvalvular velocity was within the normal range. There was trivial regurgitation.  ------------------------------------------------------------------- Tricuspid valve:   Structurally normal valve.   Leaflet separation was normal.  Doppler:  Transvalvular velocity was within the normal range. There was mild  regurgitation.  ------------------------------------------------------------------- Right atrium:  The atrium was normal in size.  ------------------------------------------------------------------- Pericardium:  The pericardium was normal in appearance.   ------------------------------------------------------------------- Post procedure conclusions Ascending Aorta:  - Moderate dilatation of the sinuses and ascending root No signs of   discection suggest CT correlation.  ------------------------------------------------------------------- Measurements   Left ventricle                              Value        Reference  LV ID, ED, PLAX chordal             (L)     32.4  mm     43 - 52  LV ID, ES, PLAX chordal                     25.4  mm     23 - 38  LV fx shortening, PLAX chordal      (L)     22    %      >=29  LV PW thickness, ED                         14.8  mm     ---------  IVS/LV PW ratio, ED                         0.69         <=1.3  LV e&', lateral                              6.31  cm/s   ---------  LV E/e&', lateral                            8.68         ---------  LV e&', medial                               5.44  cm/s   ---------  LV E/e&', medial                             10.07        ---------  LV e&', average                              5.88  cm/s   ---------  LV E/e&', average                            9.33         ---------    Ventricular septum                          Value        Reference  IVS thickness, ED                           10.2  mm     ---------    Aorta                                       Value        Reference  Aortic root ID, ED                          43    mm     ---------    Left atrium                                 Value        Reference  LA ID, A-P, ES                              31    mm     ---------  LA ID/bsa, A-P                              1.43  cm/m^2 <=2.2    Mitral valve                                Value         Reference  Mitral E-wave peak velocity                 54.8  cm/s   ---------  Mitral A-wave peak velocity                 70.4  cm/s   ---------  Mitral deceleration time            (H)     349   ms     150 - 230  Mitral E/A ratio, peak                      0.8          ---------    Pulmonary arteries                          Value        Reference  PA pressure, S, DP  23    mm Hg  <=30    Tricuspid valve                             Value        Reference  Tricuspid peak RV-RA gradient               20    mm Hg  ---------  Tricuspid maximal regurg velocity,          224   cm/s   ---------  PISA    Systemic veins                              Value        Reference  Estimated CVP                               3     mm Hg  ---------    Right ventricle                             Value        Reference  RV pressure, S, DP                          23    mm Hg  <=30  CT HEAD WITHOUT CONTRAST   CT MAXILLOFACIAL WITHOUT CONTRAST   TECHNIQUE: Multidetector CT imaging of the head and cervical spine structures were performed using the standard protocol without intravenous contrast. Multiplanar CT image reconstructions of the maxillofacial structures were also generated.   COMPARISON:  10/24/2010 and 06/14/2009   FINDINGS: CT HEAD FINDINGS   Stable left frontal soft tissue lipoma over the periosteum.   Global atrophy and chronic ischemic changes are stable. No mass effect, midline shift, or acute intracranial hemorrhage. Mastoid air cells are clear. Minimal mucosal thickening in the ethmoid air cells. Old fracture of the medial wall of the left orbit.   CT CERVICAL SPINE FINDINGS   No acute fracture. No dislocation. 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 There is multilevel spinal stenosis. No obvious spinal hematoma. No obvious soft tissue injury. Endotracheal tube is in place.   IMPRESSION: No acute intracranial  pathology.   No evidence of acute cervical spine injury. Advanced degenerative changes are noted.     MRI HEAD WITHOUT AND WITH CONTRAST   TECHNIQUE: Multiplanar, multiecho pulse sequences of the brain and surrounding structures were obtained without and with intravenous contrast.   CONTRAST:  20 cc MultiHance.   COMPARISON:  MR cervical spine performed the same date and dictated separately.   Head CT 08/16/2014.  No comparison brain MR.   FINDINGS: Some the sequences are motion degraded.   No acute infarct   No intracranial hemorrhage.   Global atrophy. Ventricular size may be related to atrophy rather than hydrocephalus.   No intracranial mass or abnormal enhancement.   No MR evidence of Wernicke's encephalopathy.   Major intracranial vascular structures are patent.   Abnormal appearance of the upper cervical spine better delineated on recent cervical spine MR. Please see report from such. An additional consideration is that the patient had a traumatic event (such as a fall) which caused cord contusion contributing to  patient's inability to move extremities. Subacute combined degeneration/ vitamin D deficiency contributing to the cervical cord changes although possible given the patient's history of alcohol use is felt unlikely as cause of the current findings.   Prominent pannus.  This contributes to baseline spinal stenosis.   Left frontal scalp lipoma.   IMPRESSION: Some the sequences are motion degraded.   No acute infarct   No intracranial hemorrhage.   Global atrophy. Ventricular size may be related to atrophy rather than hydrocephalus.   No intracranial mass or abnormal enhancement.   No MR evidence of Wernicke's encephalopathy.   Abnormal appearance of the upper cervical spine better delineated on recent cervical spine MR. Please see report from such. An additional consideration is that the patient had a traumatic event (such as a fall) which  caused cord contusion contributing to patient's inability to move extremities.   PORTABLE ABDOMEN - 1 VIEW   COMPARISON:  Single view of the abdomen 08/19/2014.   FINDINGS: Feeding tube is in place with the tip projecting inferiorly along the midbody of the stomach.   IMPRESSION: As above. PORTABLE CHEST - 1 VIEW   COMPARISON:  Chest radiograph 08/25/2014   FINDINGS: Stable cardiomegaly. Stable mediastinal and hilar contours. Trachea midline. Normal pulmonary vascularity. Lung volumes are normal in the lungs are clear. No airspace disease, effusion, or pneumothorax is visualized. There typical degenerative changes of thoracic spine. Visualized upper abdomen is unremarkable.   IMPRESSION: Stable cardiomegaly.  No acute cardiopulmonary disease identified.   MRI CERVICAL SPINE WITHOUT AND WITH CONTRAST   TECHNIQUE: Multiplanar and multiecho pulse sequences of the cervical spine, to include the craniocervical junction and cervicothoracic junction, were obtained according to standard protocol without and with intravenous contrast.   CONTRAST:  30mL MULTIHANCE GADOBENATE DIMEGLUMINE 529 MG/ML IV SOLN   COMPARISON:  MRI of the cervical spine without and with contrast 08/19/2014.   FINDINGS: Marked pannus is again noted posterior to C1-2 with narrowing of the dens, compatible with rheumatoid arthritis. This results in focal narrowing of the spinal canal.   Diffuse cord signal abnormality is again noted within the cord at this level. The cephalo caudad extent of cord signal abnormality has diminished. Diffusion-weighted imaging was done through this areas well without evidence for restricted diffusion.   Postcontrast imaging demonstrates no focal enhancement of the cord. There is dural enhancement is previously-seen. There is also enhancement within the dens. There is also some enhancement of the posterior pannus.   Multilevel degenerative changes of the cervical spine  are otherwise stable. Chronic marrow signal changes at C7-T1 are stable. No other focal marrow signal abnormality is present.   IMPRESSION: 1. Decreased cephalo caudad extent of T2 signal abnormality at the C1-2 level. There is no diffusion abnormality. The findings are most compatible with acute trauma. Myelomalacia is considered less likely given normal overall size of the cord. 2. Abnormal marrow signal and enhancement within the dens. This may be related to the arthritic changes. Posttraumatic marrow changes are also considered. There is no definite displaced fracture. 3. Multilevel spondylosis of the cervical spine is otherwise stable.     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 Continue supportive care. Protein calorie malnutrition-continue supplements Alcohol abuse-Start MVI, thiamine and folic acid. Dysphagia-continue dysphagia 1 diet Elevated liver functions-likely secondary to alcohol abuse. Recheck. Check CBC and BMP  I have reviewed patient's medical records received at admission/from hospitalization.  CPT CODE: 50539  Gayani Y Dasanayaka, MD [facility]  [phone]      

## 2014-09-05 ENCOUNTER — Non-Acute Institutional Stay (SKILLED_NURSING_FACILITY): Payer: Medicare Other | Admitting: Internal Medicine

## 2014-09-05 DIAGNOSIS — D473 Essential (hemorrhagic) thrombocythemia: Secondary | ICD-10-CM

## 2014-09-07 NOTE — Progress Notes (Signed)
Patient ID: Anthony Mcclure, male   DOB: 13-May-1951, 62 y.o.   MRN: [id number]           PROGRESS NOTE  DATE: 09/05/2014           FACILITY:  [facility]  LEVEL OF CARE: SNF (31)  Acute Visit  CHIEF COMPLAINT:  Manage thrombocytosis.    HISTORY OF PRESENT ILLNESS: I was requested by the staff to assess the patient regarding above problem(s):  On 09/04/2014:  Platelet count was 423.  On 08/29/2014:  Platelet count was 297.  Patient is a poor historian.    PAST MEDICAL HISTORY : Reviewed.  No changes/see problem list  CURRENT MEDICATIONS: Reviewed per MAR/see medication list  PHYSICAL EXAMINATION  VS: see VS section  GENERAL: no acute distress, normal body habitus RESPIRATORY: breathing is even & unlabored, BS CTAB CARDIAC: RRR, no murmur,no extra heart sounds, no edema  ASSESSMENT/PLAN:  Thrombocytosis.  New problem.  Likely acute phase reactant.  We will recheck in two weeks.    CPT CODE: 29191           Gayani Y Dasanayaka, [place] [phone]

## 2014-09-10 ENCOUNTER — Non-Acute Institutional Stay (SKILLED_NURSING_FACILITY): Payer: Medicare Other | Admitting: Internal Medicine

## 2014-09-10 DIAGNOSIS — R404 Transient alteration of awareness: Secondary | ICD-10-CM

## 2014-09-10 DIAGNOSIS — R748 Abnormal levels of other serum enzymes: Secondary | ICD-10-CM

## 2014-09-10 DIAGNOSIS — R400 Somnolence: Secondary | ICD-10-CM

## 2014-09-12 DIAGNOSIS — R748 Abnormal levels of other serum enzymes: Secondary | ICD-10-CM | POA: Insufficient documentation

## 2014-09-12 NOTE — Addendum Note (Signed)
Addended by: Merlene Laughter on: 09/12/2014 05:20 PM   Modules accepted: Level of Service

## 2014-09-12 NOTE — Progress Notes (Addendum)
Patient ID: Anthony Mcclure, male   DOB: 1951-02-12, 63 y.o.   MRN: [id number]           PROGRESS NOTE  DATE: 09/10/2014          FACILITY:  [facility]  LEVEL OF CARE: SNF (31)  Acute Visit  CHIEF COMPLAINT:  Manage elevated liver functions & lethargy  HISTORY OF PRESENT ILLNESS: I was requested by the staff to assess the patient regarding above problem(s):  ELEVATED LFT: On 09/07/2014:  AST 75, ALT 96.  On 08/25/2014:  AST 125, ALT 147.  Patient is a poor historian.  He has a history of alcohol abuse.  Patient does not follow commands.     LETHARGY: New problem. Staff report the patient is very lethargic this morning. They cannot identify precipitating or alleviating factors. There are no other associated signs and symptoms. Symptoms are constant.  PAST MEDICAL HISTORY : Reviewed.  No changes/see problem list  CURRENT MEDICATIONS: Reviewed per MAR/see medication list  ROS: Unobtainable-patient is lethargic  PHYSICAL EXAMINATION  VS: see VS section  GENERAL: no acute distress, normal body habitus NECK: No thyromegaly, no mass, trachea midline RESPIRATORY: Decreased breath sounds, no wheezing or crackles CARDIAC: RRR, no murmur,no extra heart sounds, +2 bilateral lower extremity edema      GI: mild right upper quadrant tenderness to palpation, abdomen soft, normal BS, no masses, no hepatomegaly, no splenomegaly NEUROLOGIC: Patient is unresponsive  ASSESSMENT/PLAN:  Elevated liver functions.  Trending down.  We will monitor.   Altered mental status-will follow up on labs. Check UA culture and sensitivities. Monitor patient and VS closely  CPT CODE: 61224        Betrice Wanat Y Antaeus Karel, MD [facility] [phone]

## 2014-09-13 ENCOUNTER — Non-Acute Institutional Stay (SKILLED_NURSING_FACILITY): Payer: Medicare Other | Admitting: Internal Medicine

## 2014-09-13 DIAGNOSIS — Y95 Nosocomial condition: Principal | ICD-10-CM

## 2014-09-13 DIAGNOSIS — J189 Pneumonia, unspecified organism: Secondary | ICD-10-CM

## 2014-09-13 NOTE — Progress Notes (Signed)
Patient ID: Anthony Mcclure, male   DOB: 1951-04-03, 63 y.o.   MRN: [id number] Facility; [facility] Chief low-grade fever, diaphoresis, bradycardia History; this is a 64 year old man who was admitted to hospital at [facility] from 73 through 9/18. He suffered a witnessed cardiac arrest however he did not receive CPR for 15 minutes. Original rhythm was pulseless electrical activity appear he was intubated. After he is extubated he was noted to be quadriparetic at. 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 His cardiac enzymes and EKG were negative. He was felt to have MRSA pneumonia. He was started on a dysphagia 1 diet eating 100% of meal. He is also noted to have long-standing paroxysmal atrial flutter currently maintaining sinus rhythm.  Family last night this man was febrile up to 101 he responded to antipyretics and cold cloth to. He was noted today to be lying in bed he was diaphoretic with an irregular pulse. Vital signs showed a pulse that was 48 and irregular his axillary temperature was 100 and respirations 40  Past Medical History  Diagnosis Date  . CHF (congestive heart failure)   . Alcohol abuse   . Seizure   . Noncompliance with medication treatment due to intermittent use of medication 02/02/2012  . HTN (hypertension) 02/02/2012  . Alcohol withdrawal seizure 02/02/2012  . Hypertension   . Dysrhythmia     paroxismal atrial fib  . Paroxysmal Atrial Flutter 02/01/2012  . Hepatitis C 02/02/2012    Unsure if he was ever diagnosed with Hep C  . Hepatitis C    Medication ASA 325 daily Toprolol 25 twice a day Senokot 8.6 each bedtime Tylenol 650 every 6 when necessary Xanax 1 mg by mouth 3 times a day when necessary  Review of systems is not otherwise possible  Physical examination Vitals blood pressure 110/70 pulse 88 and irregular axillary  temperature was 100 respirations 34 and shallow. Pulse ox 93% on room air Gen. patient is awake tachypneic Respiratory; left lung sounds clear there is decreased air entry of the right lower lobe especially at the base. There is accessory muscle use but no wheezing Cardiac heart sounds are regular no gallops Abdomen bowel sounds are positive no liver no spleen no tenderness GU Foley catheter in place no tenderness no CVA tenderness Extremities; no evidence of DVT  Impression/plan #1 febrile illness with tachypnea. I suspect this man has right lower lobe pneumonia. This could be an early septic picture. I reviewed his cultures from the hospital. A. intubated sputum culture grew MRSA. Blood and urine cultures were negative specimen for C. difficile was negative. His empiric antibiotics would require MRSA coverage.   #2 found to have heart rate at 48 by the nursing staff. I am getting this in the 80s however this feels like atrial fibrillation. He has a long-standing history of atrial flutter  #3 lab work from 9/22 showed a white count of 9.9 hemoglobin of 60.6 metabolic panel was normal a urine culture was ordered on 9/20 80 see if I can get this result. He had mildly elevated AST at 75 ALT of 96 on 9/25.  I am attempting to get hold of family. He is listed as a DO NOT RESUSCITATE from the hospital however on admission here the patient rescinded this on his own recognizance. He is not competent to make this decision is not orientated to  the year or place month city. He could tell me his name however. I have written the DNR pending a propper discussion with the POA/RP

## 2014-09-13 DEATH — deceased

## 2014-09-17 ENCOUNTER — Non-Acute Institutional Stay (SKILLED_NURSING_FACILITY): Payer: Medicare Other | Admitting: Internal Medicine

## 2014-09-17 DIAGNOSIS — D638 Anemia in other chronic diseases classified elsewhere: Secondary | ICD-10-CM

## 2014-09-17 DIAGNOSIS — D72829 Elevated white blood cell count, unspecified: Secondary | ICD-10-CM

## 2014-09-17 DIAGNOSIS — N390 Urinary tract infection, site not specified: Secondary | ICD-10-CM

## 2014-09-17 DIAGNOSIS — I10 Essential (primary) hypertension: Secondary | ICD-10-CM

## 2014-09-17 DIAGNOSIS — T83511A Infection and inflammatory reaction due to indwelling urethral catheter, initial encounter: Principal | ICD-10-CM

## 2014-09-17 DIAGNOSIS — T8351XA Infection and inflammatory reaction due to indwelling urinary catheter, initial encounter: Secondary | ICD-10-CM

## 2014-09-18 ENCOUNTER — Non-Acute Institutional Stay (SKILLED_NURSING_FACILITY): Payer: Medicare Other | Admitting: Internal Medicine

## 2014-09-18 DIAGNOSIS — B9620 Unspecified Escherichia coli [E. coli] as the cause of diseases classified elsewhere: Secondary | ICD-10-CM

## 2014-09-18 DIAGNOSIS — T8351XA Infection and inflammatory reaction due to indwelling urinary catheter, initial encounter: Secondary | ICD-10-CM

## 2014-09-18 DIAGNOSIS — N390 Urinary tract infection, site not specified: Secondary | ICD-10-CM

## 2014-09-18 DIAGNOSIS — R894 Abnormal immunological findings in specimens from other organs, systems and tissues: Secondary | ICD-10-CM

## 2014-09-18 DIAGNOSIS — T83511A Infection and inflammatory reaction due to indwelling urethral catheter, initial encounter: Principal | ICD-10-CM

## 2014-09-18 DIAGNOSIS — R768 Other specified abnormal immunological findings in serum: Secondary | ICD-10-CM

## 2014-09-19 ENCOUNTER — Non-Acute Institutional Stay (SKILLED_NURSING_FACILITY): Payer: Medicare Other | Admitting: Internal Medicine

## 2014-09-19 DIAGNOSIS — D509 Iron deficiency anemia, unspecified: Secondary | ICD-10-CM

## 2014-09-20 NOTE — Progress Notes (Signed)
Patient ID: Anthony Mcclure, male   DOB: 1951-08-24, 63 y.o.   MRN: [id number]           PROGRESS NOTE  DATE: 09/17/2014              FACILITY:  [facility]  LEVEL OF CARE: SNF (31)  Acute Visit  CHIEF COMPLAINT:  Manage UTI & anemia.    HISTORY OF PRESENT ILLNESS: I was requested by the staff to assess the patient regarding above problem(s):  UTI.  On 09/11/2014:  Patient's urinalysis showed cloudy appearance, 3+ WBC esterase, +2 nitrite, WBC greater than 30, RBC 3-10.  Urine culture grows E.coli significantly.  Patient is a poor historian.    ANEMIA.  On 09/14/2014:  Patient's hemoglobin was 12.5, MCV 78.  On 09/04/2014:  Hemoglobin was 13.2, MCV 81.  Staff do not report melena or hematochezia.    PAST MEDICAL HISTORY : Reviewed.  No changes/see problem list  CURRENT MEDICATIONS: Reviewed per MAR/see medication list  REVIEW OF SYSTEMS:  Unobtainable.  Patient is a poor historian.      PHYSICAL EXAMINATION  VS: see VS section  GENERAL: no acute distress, normal body habitus EYES: conjunctivae normal, sclerae normal, normal eye lids NECK: supple, trachea midline, no neck masses, no thyroid tenderness, no thyromegaly LYMPHATICS: no LAN in the neck, no supraclavicular LAN RESPIRATORY: breathing is even & unlabored, BS CTAB CARDIAC: RRR, no murmur,no extra heart sounds, no edema GI: abdomen soft, normal BS, no masses, no tenderness, no hepatomegaly, no splenomegaly PSYCHIATRIC: the patient is alert, unable to assess orientation, affect & behavior appropriate  LABS/RADIOLOGY:  On 09/14/2014:  WBC 13.3, ANC 9.5.    On 09/04/2014:  WBC 9.9.    ASSESSMENT/PLAN:  UTI.  New problem.  Patient was started on Rocephin and doxycycline.     Anemia.  New problem.  Check iron studies due to microcytosis.    Leukocytosis.  New problem.  Likely secondary to UTI.  We will monitor.     Hypertension.  Blood pressure is uncontrolled.  Increase Lopressor to 50 mg b.i.d.       CPT CODE: 62263            Anthony Mcclure Y Anthony Mcclure, [name] [phone]

## 2014-09-21 NOTE — Progress Notes (Addendum)
Patient ID: Anthony Mcclure, male   DOB: [birth year], 63 y.o.   MRN: [id number]      .         PROGRESS NOTE  DATE:  09/18/2014    FACILITY: [facility]    LEVEL OF CARE:   SNF   Acute Visit   CHIEF COMPLAINT:  Follow up fever, diaphoresis, bradycardia.    HISTORY OF PRESENT ILLNESS:  I saw this man last week.  He is a gentleman who was admitted to [facility] from 08/16/2014 through 08/31/2014.  He suffered a witnessed cardiac arrest but did not receive CPR for 15 minutes.  Originally required ACLS for pulseless electrical activity.  He was intubated.  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  His cardiac enzymes and EKG were negative.    When I saw him last week, he had been febrile for 48 hours.  He was also diaphoretic and bradycardic.  I thought he had pneumonia, although a chest x-ray was apparently clear.  His white count was 13.3, 72% neutrophils.  Albumin was 2.8, AST 75, ALT 96.   His urine culture showed greater than 100,000 E.coli with multiple sensitivities.  Blood culture showed Coag-negative staph.    REVIEW OF SYSTEMS:  Not possible from the patient.   GU:  However, he has blisters on his foreskin that I have been asked to look at.     PHYSICAL EXAMINATION:   VITAL SIGNS:   O2 SATURATIONS:  95% on room air.   PULSE:  60.   RESPIRATIONS:  20.   GENERAL APPEARANCE:  The patient is awake and responsive.  However, during the course of my exam, he fades off to sleep and is difficult to arouse.  This is the same pattern I saw last week.   CHEST/RESPIRATORY:  Shallow, but otherwise clear air entry.   CARDIOVASCULAR:  CARDIAC:   Heart sounds are normal.   GASTROINTESTINAL:  ABDOMEN:   Soft.   LIVER/SPLEEN/KIDNEYS:  No liver, no spleen.  No tenderness.   CIRCULATION:  EDEMA/VARICOSITIES:  EXTREMITIES:  No evidence of a DVT.     ASSESSMENT/PLAN:  E.coli UTI.  He is on parenteral Rocephin and doxycycline which were started empirically when I thought he had pneumonia.  However, this can be simplified to cover the E.coli, which is ampicillin-sensitive, in his urine.    There are indeed blisters on the shaft of his penis.  This very well could be herpetic.   I will put him on Famvir.    History of hepatitis C with slight elevation of his liver function tests.  He has altered LOC.  It might be wise to check an ammonia level on him at some point.    Severe hypoalbuminemia with an albumin of 2.8.  We will need to monitor his oral intake.

## 2014-09-25 ENCOUNTER — Emergency Department (HOSPITAL_COMMUNITY): Payer: Medicare Other

## 2014-09-25 ENCOUNTER — Encounter (HOSPITAL_COMMUNITY): Payer: Self-pay | Admitting: Emergency Medicine

## 2014-09-25 ENCOUNTER — Emergency Department (HOSPITAL_COMMUNITY)
Admission: EM | Admit: 2014-09-25 | Discharge: 2014-09-26 | Disposition: A | Discharge status: Home / Self Care | Payer: Medicare Other | Attending: Emergency Medicine | Admitting: Emergency Medicine

## 2014-09-25 DIAGNOSIS — Z8669 Personal history of other diseases of the nervous system and sense organs: Secondary | ICD-10-CM | POA: Diagnosis not present

## 2014-09-25 DIAGNOSIS — Z7982 Long term (current) use of aspirin: Secondary | ICD-10-CM | POA: Diagnosis not present

## 2014-09-25 DIAGNOSIS — Z8619 Personal history of other infectious and parasitic diseases: Secondary | ICD-10-CM | POA: Diagnosis not present

## 2014-09-25 DIAGNOSIS — I4892 Unspecified atrial flutter: Secondary | ICD-10-CM | POA: Diagnosis not present

## 2014-09-25 DIAGNOSIS — R0682 Tachypnea, not elsewhere classified: Secondary | ICD-10-CM

## 2014-09-25 DIAGNOSIS — I509 Heart failure, unspecified: Secondary | ICD-10-CM | POA: Insufficient documentation

## 2014-09-25 DIAGNOSIS — Z720 Tobacco use: Secondary | ICD-10-CM | POA: Insufficient documentation

## 2014-09-25 DIAGNOSIS — Z79899 Other long term (current) drug therapy: Secondary | ICD-10-CM | POA: Diagnosis not present

## 2014-09-25 DIAGNOSIS — R4182 Altered mental status, unspecified: Secondary | ICD-10-CM

## 2014-09-25 DIAGNOSIS — I10 Essential (primary) hypertension: Secondary | ICD-10-CM | POA: Diagnosis not present

## 2014-09-25 DIAGNOSIS — Z8659 Personal history of other mental and behavioral disorders: Secondary | ICD-10-CM | POA: Insufficient documentation

## 2014-09-25 DIAGNOSIS — G8250 Quadriplegia, unspecified: Secondary | ICD-10-CM

## 2014-09-25 HISTORY — DX: Quadriplegia, unspecified: G82.50

## 2014-09-25 LAB — URINE MICROSCOPIC-ADD ON

## 2014-09-25 LAB — CBC WITH DIFFERENTIAL/PLATELET
BASOS ABS: 0 10*3/uL (ref 0.0–0.1)
Basophils Relative: 0 % (ref 0–1)
EOS ABS: 0.2 10*3/uL (ref 0.0–0.7)
Eosinophils Relative: 2 % (ref 0–5)
HCT: 39 % (ref 39.0–52.0)
Hemoglobin: 13 g/dL (ref 13.0–17.0)
Lymphocytes Relative: 16 % (ref 12–46)
Lymphs Abs: 1.8 10*3/uL (ref 0.7–4.0)
MCH: 26.7 pg (ref 26.0–34.0)
MCHC: 33.3 g/dL (ref 30.0–36.0)
MCV: 80.1 fL (ref 78.0–100.0)
Monocytes Absolute: 1.1 10*3/uL — ABNORMAL HIGH (ref 0.1–1.0)
Monocytes Relative: 9 % (ref 3–12)
Neutro Abs: 8.3 10*3/uL — ABNORMAL HIGH (ref 1.7–7.7)
Neutrophils Relative %: 73 % (ref 43–77)
PLATELETS: 188 10*3/uL (ref 150–400)
RBC: 4.87 MIL/uL (ref 4.22–5.81)
RDW: 16.1 % — ABNORMAL HIGH (ref 11.5–15.5)
WBC: 11.5 10*3/uL — ABNORMAL HIGH (ref 4.0–10.5)

## 2014-09-25 LAB — URINALYSIS, ROUTINE W REFLEX MICROSCOPIC
Bilirubin Urine: NEGATIVE
Glucose, UA: NEGATIVE mg/dL
HGB URINE DIPSTICK: NEGATIVE
Ketones, ur: NEGATIVE mg/dL
Nitrite: NEGATIVE
PROTEIN: NEGATIVE mg/dL
Specific Gravity, Urine: 1.029 (ref 1.005–1.030)
Urobilinogen, UA: 2 mg/dL — ABNORMAL HIGH (ref 0.0–1.0)
pH: 5.5 (ref 5.0–8.0)

## 2014-09-25 LAB — BASIC METABOLIC PANEL
Anion gap: 12 (ref 5–15)
BUN: 14 mg/dL (ref 6–23)
CO2: 26 mEq/L (ref 19–32)
Calcium: 9 mg/dL (ref 8.4–10.5)
Chloride: 100 mEq/L (ref 96–112)
Creatinine, Ser: 0.6 mg/dL (ref 0.50–1.35)
GFR calc Af Amer: >90 mL/min (ref >90)
Glucose, Bld: 102 mg/dL — ABNORMAL HIGH (ref 70–99)
Potassium: 4.3 mEq/L (ref 3.7–5.3)
Sodium: 138 mEq/L (ref 137–147)

## 2014-09-25 LAB — I-STAT CG4 LACTIC ACID, ED: Lactic Acid, Venous: 1.16 mmol/L (ref 0.5–2.2)

## 2014-09-25 MED ORDER — IPRATROPIUM BROMIDE 0.02 % IN SOLN
0.5000 mg | Freq: Once | RESPIRATORY_TRACT | Status: AC
  Administered 2014-09-25: 0.5 mg via RESPIRATORY_TRACT
  Filled 2014-09-25: qty 2.5

## 2014-09-25 MED ORDER — ALBUTEROL SULFATE (2.5 MG/3ML) 0.083% IN NEBU
5.0000 mg | INHALATION_SOLUTION | Freq: Once | RESPIRATORY_TRACT | Status: AC
  Administered 2014-09-25: 5 mg via RESPIRATORY_TRACT
  Filled 2014-09-25: qty 6

## 2014-09-25 MED ORDER — SODIUM CHLORIDE 0.9 % IV SOLN
INTRAVENOUS | Status: DC
  Administered 2014-09-25: 21:00:00 via INTRAVENOUS

## 2014-09-25 MED ORDER — METOPROLOL TARTRATE 25 MG PO TABS
50.0000 mg | ORAL_TABLET | Freq: Once | ORAL | Status: AC
  Administered 2014-09-25: 50 mg via ORAL
  Filled 2014-09-25: qty 2

## 2014-09-25 MED ORDER — METOPROLOL TARTRATE 50 MG PO TABS: 50.0000 mg | ORAL_TABLET | Freq: Two times a day (BID) | ORAL | Status: DC

## 2014-09-25 NOTE — ED Notes (Signed)
Per ems, called to patient facility for altered mental status, decreased level of consciousness.  ems states he is axo to person, place, and time.

## 2014-09-25 NOTE — ED Notes (Signed)
Bilateral feet in drop boots

## 2014-09-25 NOTE — ED Provider Notes (Signed)
CSN: [id number]     Arrival date & time 09/25/14  1856 History   First MD Initiated Contact with Patient 09/25/14 1919     Chief Complaint  Patient presents with  . Altered Mental Status     (Consider location/radiation/quality/duration/timing/severity/associated sxs/prior Treatment) HPI  Anthony Mcclure is a 63 y.o. maleWho is being cared for in a skilled nursing facility. Staff. They are concerned about these abnormal vital signs, and a period of unresponsiveness, today. Paperwork was sent with the patient, however, it does not detail the specific events of today. There is an on time. Note regarding. Vital signs today of blood pressure 74/50, respiratory rate 14, temperature 90.8 and heart rate 146, Pulse oxygenation 92%. Another note indicates, respiratory rate of 40, treated with Xanax.I called and talked to a nurse, that was familiar with his care and she states that his pulse has been as high as 156. His blood pressure as low as 70/48 and his respiratory rate, variable from 10-40. She also reports that the patient was lethargic, and unresponsive for one half hours, today. They thought they would call a code, but decided not to. Additionally, staff has noticed that when his head is raised about 45 he gets hypotensive, and when he is lying supine. His blood pressure is hypertensive. Patient completed a course of amoxicillin for UTI, 2 days ago. He is on Famvir, currently for penile lesions.   Level V Caveat- altered mental status   Past Medical History  Diagnosis Date  . CHF (congestive heart failure)   . Alcohol abuse   . Seizure   . Noncompliance with medication treatment due to intermittent use of medication 02/02/2012  . HTN (hypertension) 02/02/2012  . Alcohol withdrawal seizure 02/02/2012  . Hypertension   . Dysrhythmia     paroxismal atrial fib  . Paroxysmal Atrial Flutter 02/01/2012  . Hepatitis C 02/02/2012    Unsure if he was ever diagnosed with Hep C  . Hepatitis C   .  Quadriplegia    Past Surgical History  Procedure Laterality Date  . Forearm reconstruction     Family History  Problem Relation Age of Onset  . Heart disease Father   . Alcohol abuse Father   . Alcohol abuse Brother   . Diabetes type II Mother    History  Substance Use Topics  . Smoking status: Light Tobacco Smoker  . Smokeless tobacco: Never Used  . Alcohol Use: Yes     Comment: statrts drinking everyday at 5am drinks about 2 fiths been drinking for about 10 years    Review of Systems  Unable to perform ROS     Allergies  Review of patient's allergies indicates no known allergies.  Home Medications   Prior to Admission medications   Medication Sig Start Date End Date Taking? Authorizing Provider  acetaminophen (TYLENOL) 325 MG tablet Take 2 tablets (650 mg total) by mouth every 6 (six) hours as needed for fever. 08/31/14  Yes Belkys A Regalado, MD  ALPRAZolam (XANAX) 1 MG tablet Take 1 tablet (1 mg total) by mouth 3 (three) times daily as needed for anxiety or sleep (agitation). 08/31/14  Yes Belkys A Regalado, MD  Amino Acids-Protein Hydrolys (FEEDING SUPPLEMENT, PRO-STAT SUGAR FREE 64,) LIQD Take 30 mLs by mouth 2 (two) times daily.   Yes Historical Provider, MD  aspirin 325 MG tablet Take 1 tablet (325 mg total) by mouth daily. 08/31/14  Yes Belkys A Regalado, MD  chlorhexidine (PERIDEX) 0.12 % solution 15 mLs  by Mouth Rinse route 2 (two) times daily. 08/31/14  Yes Belkys A Regalado, MD  folic acid (FOLVITE) 1 MG tablet Take 1 mg by mouth daily.   Yes Historical Provider, MD  metoprolol tartrate (LOPRESSOR) 25 MG tablet Take 1 tablet (25 mg total) by mouth 2 (two) times daily. 08/31/14  Yes Belkys A Regalado, MD  Multiple Vitamins-Minerals (MULTIVITAMIN WITH MINERALS) tablet Take 1 tablet by mouth daily.   Yes Historical Provider, MD  senna (SENOKOT) 8.6 MG TABS tablet Take 1 tablet (8.6 mg total) by mouth at bedtime. 08/31/14  Yes Belkys A Regalado, MD   BP 117/75  Pulse  89  Temp(Src) 99.1 F (37.3 C) (Rectal)  Resp 23  Ht 6\' 2"  (1.88 m)  Wt 200 lb (90.719 kg)  BMI 25.67 kg/m2  SpO2 100% Physical Exam  Nursing note and vitals reviewed. Constitutional: He appears well-developed and well-nourished. No distress.  HENT:  Head: Normocephalic and atraumatic.  Right Ear: External ear normal.  Left Ear: External ear normal.  Oral mucosa are mildly dry  Eyes: Conjunctivae and EOM are normal. Pupils are equal, round, and reactive to light.  Neck: Normal range of motion and phonation normal. Neck supple.  Cardiovascular: Normal rate, regular rhythm and normal heart sounds.   Pulmonary/Chest: Effort normal and breath sounds normal. He exhibits no bony tenderness.  Abdominal: Soft. There is no tenderness.  Abdomen is full. Patient does not exhibit any distress with palpation of the abdomen.  Neurological: He is alert. No sensory deficit.  Flaccid paresis of the arms, and legs, bilaterally.  Skin: Skin is warm, dry and intact.  Mild diaphoresis of the face, only  Psychiatric: He has a normal mood and affect. His behavior is normal.    ED Course  Procedures (including critical care time)  Medications  0.9 %  sodium chloride infusion ( Intravenous Stopped 09/25/14 2326)  metoprolol tartrate (LOPRESSOR) tablet 50 mg (50 mg Oral Given 09/25/14 2249)  albuterol (PROVENTIL) (2.5 MG/3ML) 0.083% nebulizer solution 5 mg (5 mg Nebulization Given 09/25/14 2317)  ipratropium (ATROVENT) nebulizer solution 0.5 mg (0.5 mg Nebulization Given 09/25/14 2317)    Patient Vitals for the past 24 hrs:  BP Temp Temp src Pulse Resp SpO2 Height Weight  09/25/14 2330 104/73 mmHg - - 84 31 100 % - -  09/25/14 2300 93/63 mmHg - - 92 27 99 % - -  09/25/14 2249 134/83 mmHg - - 109 - - - -  09/25/14 2230 134/83 mmHg - - 103 24 93 % - -  09/25/14 2200 123/82 mmHg - - 90 29 100 % - -  09/25/14 2146 137/93 mmHg - - 92 22 99 % - -  09/25/14 2130 137/93 mmHg - - 88 37 100 % - -   09/25/14 2100 147/95 mmHg - - 87 30 99 % - -  09/25/14 2040 142/96 mmHg - - 91 32 98 % - -  09/25/14 2000 141/91 mmHg - - 86 29 100 % - -  09/25/14 1940 112/66 mmHg - - 87 34 100 % - -  09/25/14 1930 117/75 mmHg - - 89 23 100 % - -  09/25/14 1920 - 99.1 F (37.3 C) Rectal - - - - -  09/25/14 1915 120/85 mmHg - - 93 21 99 % - -  09/25/14 1901 122/84 mmHg 99.2 F (37.3 C) Oral 93 33 99 % 6\' 2"  (1.88 m) 200 lb (90.719 kg)  09/25/14 1857 - - - - - 99 % - -  11:55 PM Reevaluation with update and discussion. After initial assessment and treatment, an updated evaluation reveals he is comfortable, BP improved, no further c/o. Avenell Sellers L   CRITICAL CARE Performed by: Daleen Bo L Total critical care time: 35 minutes Critical care time was exclusive of separately billable procedures and treating other patients. Critical care was necessary to treat or prevent imminent or life-threatening deterioration. Critical care was time spent personally by me on the following activities: development of treatment plan with patient and/or surrogate as well as nursing, discussions with consultants, evaluation of patient's response to treatment, examination of patient, obtaining history from patient or surrogate, ordering and performing treatments and interventions, ordering and review of laboratory studies, ordering and review of radiographic studies, pulse oximetry and re-evaluation of patient's condition.  Labs Review Labs Reviewed - No data to display  Imaging Review No results found.   EKG Interpretation   Date/Time:  Tuesday September 25 2014 19:02:41 EDT Ventricular Rate:  86 PR Interval:  147 QRS Duration: 101 QT Interval:  374 QTC Calculation: 447 R Axis:   59 Text Interpretation:  Sinus rhythm Atrial premature complex Borderline low  voltage, extremity leads Since last tracing rate slower and nonspecific  intraventricular conduction delay resolved. Confirmed by Eulis Foster  MD,  [place]  [phone]) on 09/25/2014 9:49:13 PM       EKG Interpretation  Date/Time:  Tuesday September 25 2014 19:34:14 EDT Ventricular Rate:  115 PR Interval:  189 QRS Duration: 99 QT Interval:  347 QTC Calculation: 480 R Axis:   78 Text Interpretation:  Sinus tachycardia Borderline low voltage, extremity leads Borderline prolonged QT interval Since last tracing of earlier today QT has lengthened and nonspecific ectopy, abberent conduction vs. PVCs Confirmed by Eulis Foster  MD, Fredna Stricker ([id number]) on 09/25/2014 9:53:23 PM      Monitor strips, from cardiac monitoring, reviewed and attached to the chart. He is having periods of rapid heartbeat, which are consistent with atrial flutter, rate, around 150, with aberrant conduction. These periods are Weyers lived, and spontaneously improved. The arrhythmia occurs between one and 5 minutes apart. The patient is asymptomatic for these episodes. And there has been no change in his blood pressure.   MDM   Final diagnoses:  Altered mental state  Atrial flutter, unspecified    Nonspecific labile vital signs, with clear evidence for atrial flutter- intermittant, and history of atrial flutter in the past, preceding his sudden cardiac death. Blood pressure stable here. No evidence for acute infectious process.a urine culture was sent, but it should be interpreted in light of firm evidence for urinary tract infection.   Nursing Notes Reviewed/ Care Coordinated Applicable Imaging Reviewed Interpretation of Laboratory Data incorporated into ED treatment  The patient appears reasonably screened and/or stabilized for discharge and I doubt any other medical condition or other [facility] requiring further screening, evaluation, or treatment in the ED at this time prior to discharge.  Plan: Home Medications- Increase Metoprolol to 50 mg BID; Home Treatments- rest, fluids; return here if the recommended treatment, does not improve the symptoms; Recommended follow up- PCP 3 days for  f/u.   Richarda Blade, MD 09/25/14 [phone]

## 2014-09-25 NOTE — ED Notes (Signed)
Spoke with Dr. Eulis Foster regarding patient's intermittent tachycardia, rhythm strips printed and placed in Medical Records chart per EDP request.

## 2014-09-25 NOTE — ED Notes (Signed)
Pt incontinent of stool on arrival.  Bottom cleansed, bedding changed.

## 2014-09-25 NOTE — Discharge Instructions (Signed)
Start the new dose, metoprolol, in the morning. Drink plenty of fluids.   Atrial Flutter Atrial flutter is a heart rhythm that can cause the heart to beat very fast (tachycardia). It originates in the upper chambers of the heart (atria). In atrial flutter, the top chambers of the heart (atria) often beat much faster than the bottom chambers of the heart (ventricles). Atrial flutter has a regular "saw toothed" appearance in an EKG readout. An EKG is a test that records the electrical activity of the heart. Atrial flutter can cause the heart to beat up to 150 beats per minute (BPM). Atrial flutter can either be Cicio lived (paroxysmal) or permanent.  CAUSES  Causes of atrial flutter can be many. Some of these include:  Heart related issues:  Heart attack (myocardial infarction).  Heart failure.  Heart valve problems.  Poorly controlled high blood pressure (hypertension).  After open heart surgery.  Lung related issues:  A blood clot in the lungs (pulmonary embolism).  Chronic obstructive pulmonary disease (COPD). Medications used to treat COPD can attribute to atrial flutter.  Other related causes:  Hyperthyroidism.  Caffeine.  Some decongestant cold medications.  Low electrolyte levels such as potassium or magnesium.  Cocaine. SYMPTOMS  An awareness of your heart beating rapidly (palpitations).  Shortness of breath.  Chest pain.  Low blood pressure (hypotension).  Dizziness or fainting. DIAGNOSIS  Different tests can be performed to diagnose atrial flutter.   An EKG.  Holter monitor. This is a 24-hour recording of your heart rhythm. You will also be given a diary. Write down all symptoms that you have and what you were doing at the time you experienced symptoms.  Cardiac event monitor. This small device can be worn for up to 30 days. When you have heart symptoms, you will push a button on the device. This will then record your heart rhythm.  Echocardiogram.  This is an imaging test to look at your heart. Your caregiver will look at your heart valves and the ventricles.  Stress test. This test can help determine if the atrial flutter is related to exercise or if coronary artery disease is present.  Laboratory studies will look at certain blood levels like:  Complete blood count (CBC).  Potassium.  Magnesium.  Thyroid function. TREATMENT  Treatment of atrial flutter varies. A combination of therapies may be used or sometimes atrial flutter may need only 1 type of treatment.  Lab work: If your blood work, such as your electrolytes (potassium, magnesium) or your thyroid function tests, are abnormal, your caregiver will treat them accordingly.  Medication:  There are several different types of medications that can convert your heart to a normal rhythm and prevent atrial flutter from reoccurring.  Nonsurgical procedures: Nonsurgical techniques may be used to control atrial flutter. Some examples include:  Cardioversion. This technique uses either drugs or an electrical shock to restore a normal heart rhythm:  Cardioversion drugs may be given through an intravenous (IV) line to help "reset" the heart rhythm.  In electrical cardioversion, your caregiver shocks your heart with electrical energy. This helps to reset the heartbeat to a normal rhythm.  Ablation. If atrial flutter is a persistent problem, an ablation may be needed. This procedure is done under mild sedation. High frequency radio-wave energy is used to destroy the area of heart tissue responsible for atrial flutter. SEEK IMMEDIATE MEDICAL CARE IF:  You have:  Dizziness.  Near fainting or fainting.  Shortness of breath.  Chest pain or pressure.  Sudden nausea or vomiting.  Profuse sweating. If you have the above symptoms, call your local emergency service immediately! Do not drive yourself to the hospital. MAKE SURE YOU:   Understand these instructions.  Will watch your  condition.  Will get help right away if you are not doing well or get worse. Document Released: 04/18/2009 Document Revised: 04/16/2014 Document Reviewed: 04/18/2009 [facility] Patient Information 2015 [place], [place]. This information is not intended to replace advice given to you by your health care provider. Make sure you discuss any questions you have with your health care provider.

## 2014-09-26 ENCOUNTER — Non-Acute Institutional Stay (SKILLED_NURSING_FACILITY): Payer: Medicare Other | Admitting: Internal Medicine

## 2014-09-26 DIAGNOSIS — R0682 Tachypnea, not elsewhere classified: Secondary | ICD-10-CM

## 2014-09-26 DIAGNOSIS — G8250 Quadriplegia, unspecified: Secondary | ICD-10-CM

## 2014-09-26 NOTE — Progress Notes (Signed)
Patient ID: Anthony Mcclure, male   DOB: [birth year], 63 y.o.   MRN: [id number]           PROGRESS NOTE  DATE: 09/19/2014      FACILITY:  [facility]  LEVEL OF CARE: SNF (31)  Acute Visit  CHIEF COMPLAINT:  Manage microcytic anemia   HISTORY OF PRESENT ILLNESS: I was requested by the staff to assess the patient regarding above problem(s):  Due to microcytic anemia, the patient's iron studies were checked.  On 09/18/2014:  TIBC 221, serum iron level 24, percent saturation 11, ferritin level 835.  Patient is a poor historian.  Staff do not report melena or hematochezia.    PAST MEDICAL HISTORY : Reviewed.  No changes/see problem list  CURRENT MEDICATIONS: Reviewed per MAR/see medication list  REVIEW OF SYSTEMS:  Unobtainable.  Patient is a poor historian.         PHYSICAL EXAMINATION  GENERAL: no acute distress, normal body habitus NECK: supple, trachea midline, no neck masses, no thyroid tenderness, no thyromegaly RESPIRATORY: breathing is even & unlabored, BS CTAB CARDIAC: RRR, no murmur,no extra heart sounds, no edema GI: abdomen soft, normal BS, no masses, no tenderness, no hepatomegaly, no splenomegaly PSYCHIATRIC: the patient is alert, disoriented, affect & behavior appropriate  LABS/RADIOLOGY:     09/14/2014:    Hemoglobin 12.5, MCV 78.    ASSESSMENT/PLAN:  Microcytic anemia.  Patient's ferritin level is high.  No sign of iron deficiency.  This is anemia of chronic disease.   We will check stool guaiac x3.    CPT CODE: 84696           Gayani Y Dasanayaka, [place] [phone]

## 2014-09-27 LAB — URINE CULTURE
Colony Count: NO GROWTH
Culture: NO GROWTH

## 2014-09-28 ENCOUNTER — Non-Acute Institutional Stay (SKILLED_NURSING_FACILITY): Payer: Medicare Other | Admitting: Internal Medicine

## 2014-09-28 DIAGNOSIS — G909 Disorder of the autonomic nervous system, unspecified: Secondary | ICD-10-CM

## 2014-09-28 DIAGNOSIS — G8250 Quadriplegia, unspecified: Secondary | ICD-10-CM

## 2014-10-02 NOTE — Progress Notes (Addendum)
Patient ID: Anthony Mcclure, male   DOB: [birth year], 63 y.o.   MRN: [id number]               PROGRESS NOTE  DATE:  09/25/2014    FAC[facility]: [facility]    LEVEL OF CARE:   SNF   Acute Visit   CHIEF COMPLAINT:  Tachypnea.    HISTORY OF PRESENT ILLNESS:  This is a 63 year-old man who was at [facility] from 08/16/2014 through 08/31/2014.  He suffered a witnessed cardiac arrest.  However, there was a delay in CPR initiation for 15 minutes, initial pulseless electrical activity.  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  An MRI of the cervical spine showed T2 signal injury compatible with a cord infarction at C3-C4.    He has a history of congestive heart failure, seizure disorder, paroxysmal atrial fib/flutter.    I was called this morning to a report that he was tachypneic at 40 and in some distress, although it seemed that he was not tachycardic with a pulse rate in the 60s and his O2 sat was in the high 90s.  There was also some suggestion that he was in a lot of pain.  Subsequent discussion with nursing staff suggests that he was repositioned.  He was given Xanax and then automatically became a lot more stable.  His breathing improved.  By the time I got here to see him later in the morning, everything appeared to be fine.    REVIEW OF SYSTEMS:  I cannot really get a lot out of this man.   CHEST/RESPIRATORY:  He does not complain of shortness of breath.   CARDIAC:   No real complaints of chest pain.    PHYSICAL EXAMINATION:   VITAL SIGNS:   O2 SATURATIONS:  Pulse ox 96%.   RESPIRATIONS:  18 and unlabored.   GENERAL APPEARANCE:  The patient is calm, relaxed, breathing easily by the time I here.   CHEST/RESPIRATORY:  Clear air entry bilaterally.   CARDIOVASCULAR:  CARDIAC:   Heart sounds are normal.  There are no murmurs.  He appears to be euvolemic.    GASTROINTESTINAL:  ABDOMEN:   Soft.   CIRCULATION:   EDEMA/VARICOSITIES:  Extremities:  He appears  to have some edema on the dorsum of his hands.  However, there is no evidence of a DVT in the upper or lower extremities.    ASSESSMENT/PLAN:  Tachypnea.  I am uncertain whether this represented a reaction to something, anxiety or pain, perhaps some aspiration.   However, this settled down very quickly and he appears to be stable now.  We will watch his vitals carefully over the next 48 hours and see how he is doing later in the week from my point of view.  No additional orders were left.  He may also have autonomic insufficiency related to his court issues. I will need to check this  CPT CODE: 31594

## 2014-10-03 ENCOUNTER — Encounter (HOSPITAL_COMMUNITY): Payer: Self-pay | Admitting: Radiology

## 2014-10-03 ENCOUNTER — Emergency Department (HOSPITAL_COMMUNITY): Payer: Medicare Other

## 2014-10-03 ENCOUNTER — Inpatient Hospital Stay (HOSPITAL_COMMUNITY)
Admission: EM | Admit: 2014-10-03 | Discharge: 2014-10-05 | DRG: 308 | Disposition: A | Discharge status: Skilled Nursing Facility | Payer: Medicare Other | Attending: Internal Medicine | Admitting: Internal Medicine

## 2014-10-03 ENCOUNTER — Non-Acute Institutional Stay (SKILLED_NURSING_FACILITY): Payer: Medicare Other | Admitting: Internal Medicine

## 2014-10-03 DIAGNOSIS — F17210 Nicotine dependence, cigarettes, uncomplicated: Secondary | ICD-10-CM | POA: Diagnosis present

## 2014-10-03 DIAGNOSIS — I10 Essential (primary) hypertension: Secondary | ICD-10-CM | POA: Diagnosis present

## 2014-10-03 DIAGNOSIS — R0600 Dyspnea, unspecified: Secondary | ICD-10-CM

## 2014-10-03 DIAGNOSIS — F1010 Alcohol abuse, uncomplicated: Secondary | ICD-10-CM | POA: Diagnosis present

## 2014-10-03 DIAGNOSIS — E46 Unspecified protein-calorie malnutrition: Secondary | ICD-10-CM | POA: Diagnosis present

## 2014-10-03 DIAGNOSIS — D72829 Elevated white blood cell count, unspecified: Secondary | ICD-10-CM | POA: Diagnosis present

## 2014-10-03 DIAGNOSIS — I4892 Unspecified atrial flutter: Secondary | ICD-10-CM | POA: Diagnosis present

## 2014-10-03 DIAGNOSIS — R1310 Dysphagia, unspecified: Secondary | ICD-10-CM | POA: Diagnosis present

## 2014-10-03 DIAGNOSIS — R0789 Other chest pain: Secondary | ICD-10-CM

## 2014-10-03 DIAGNOSIS — B1920 Unspecified viral hepatitis C without hepatic coma: Secondary | ICD-10-CM | POA: Diagnosis present

## 2014-10-03 DIAGNOSIS — Z86711 Personal history of pulmonary embolism: Secondary | ICD-10-CM | POA: Diagnosis not present

## 2014-10-03 DIAGNOSIS — Z9114 Patient's other noncompliance with medication regimen: Secondary | ICD-10-CM | POA: Diagnosis present

## 2014-10-03 DIAGNOSIS — I483 Typical atrial flutter: Secondary | ICD-10-CM

## 2014-10-03 DIAGNOSIS — Z8619 Personal history of other infectious and parasitic diseases: Secondary | ICD-10-CM

## 2014-10-03 DIAGNOSIS — R0602 Shortness of breath: Secondary | ICD-10-CM | POA: Diagnosis not present

## 2014-10-03 DIAGNOSIS — I469 Cardiac arrest, cause unspecified: Secondary | ICD-10-CM | POA: Diagnosis present

## 2014-10-03 DIAGNOSIS — Z79899 Other long term (current) drug therapy: Secondary | ICD-10-CM

## 2014-10-03 DIAGNOSIS — J9601 Acute respiratory failure with hypoxia: Secondary | ICD-10-CM

## 2014-10-03 DIAGNOSIS — K5900 Constipation, unspecified: Secondary | ICD-10-CM | POA: Diagnosis present

## 2014-10-03 DIAGNOSIS — I509 Heart failure, unspecified: Secondary | ICD-10-CM | POA: Diagnosis present

## 2014-10-03 DIAGNOSIS — R002 Palpitations: Secondary | ICD-10-CM

## 2014-10-03 DIAGNOSIS — G8250 Quadriplegia, unspecified: Secondary | ICD-10-CM | POA: Diagnosis present

## 2014-10-03 DIAGNOSIS — I480 Paroxysmal atrial fibrillation: Secondary | ICD-10-CM | POA: Diagnosis present

## 2014-10-03 DIAGNOSIS — L89152 Pressure ulcer of sacral region, stage 2: Secondary | ICD-10-CM | POA: Diagnosis present

## 2014-10-03 DIAGNOSIS — G9511 Acute infarction of spinal cord (embolic) (nonembolic): Secondary | ICD-10-CM

## 2014-10-03 DIAGNOSIS — Z7982 Long term (current) use of aspirin: Secondary | ICD-10-CM | POA: Diagnosis not present

## 2014-10-03 DIAGNOSIS — I491 Atrial premature depolarization: Secondary | ICD-10-CM

## 2014-10-03 DIAGNOSIS — Z8674 Personal history of sudden cardiac arrest: Secondary | ICD-10-CM

## 2014-10-03 DIAGNOSIS — B182 Chronic viral hepatitis C: Secondary | ICD-10-CM

## 2014-10-03 DIAGNOSIS — R079 Chest pain, unspecified: Secondary | ICD-10-CM

## 2014-10-03 DIAGNOSIS — Z66 Do not resuscitate: Secondary | ICD-10-CM | POA: Diagnosis present

## 2014-10-03 DIAGNOSIS — Z91148 Patient's other noncompliance with medication regimen for other reason: Secondary | ICD-10-CM

## 2014-10-03 LAB — I-STAT TROPONIN, ED: TROPONIN I, POC: 0.01 ng/mL (ref 0.00–0.08)

## 2014-10-03 LAB — URINALYSIS, ROUTINE W REFLEX MICROSCOPIC
Bilirubin Urine: NEGATIVE
GLUCOSE, UA: NEGATIVE mg/dL
HGB URINE DIPSTICK: NEGATIVE
Ketones, ur: NEGATIVE mg/dL
Leukocytes, UA: NEGATIVE
Nitrite: NEGATIVE
Protein, ur: NEGATIVE mg/dL
SPECIFIC GRAVITY, URINE: 1.005 (ref 1.005–1.030)
UROBILINOGEN UA: 4 mg/dL — AB (ref 0.0–1.0)
pH: 7 (ref 5.0–8.0)

## 2014-10-03 LAB — PROTIME-INR
INR: 0.99 (ref 0.00–1.49)
Prothrombin Time: 13.2 seconds (ref 11.6–15.2)

## 2014-10-03 LAB — CBC WITH DIFFERENTIAL/PLATELET
BASOS ABS: 0 10*3/uL (ref 0.0–0.1)
Basophils Relative: 0 % (ref 0–1)
EOS ABS: 0.3 10*3/uL (ref 0.0–0.7)
Eosinophils Relative: 2 % (ref 0–5)
HCT: 36.4 % — ABNORMAL LOW (ref 39.0–52.0)
HEMOGLOBIN: 12 g/dL — AB (ref 13.0–17.0)
Lymphocytes Relative: 17 % (ref 12–46)
Lymphs Abs: 2.1 10*3/uL (ref 0.7–4.0)
MCH: 26.3 pg (ref 26.0–34.0)
MCHC: 33 g/dL (ref 30.0–36.0)
MCV: 79.8 fL (ref 78.0–100.0)
MONOS PCT: 10 % (ref 3–12)
Monocytes Absolute: 1.2 10*3/uL — ABNORMAL HIGH (ref 0.1–1.0)
Neutro Abs: 8.9 10*3/uL — ABNORMAL HIGH (ref 1.7–7.7)
Neutrophils Relative %: 71 % (ref 43–77)
Platelets: 257 10*3/uL (ref 150–400)
RBC: 4.56 MIL/uL (ref 4.22–5.81)
RDW: 16.2 % — AB (ref 11.5–15.5)
WBC: 12.5 10*3/uL — ABNORMAL HIGH (ref 4.0–10.5)

## 2014-10-03 LAB — BASIC METABOLIC PANEL
Anion gap: 11 (ref 5–15)
BUN: 13 mg/dL (ref 6–23)
CO2: 27 meq/L (ref 19–32)
CREATININE: 0.54 mg/dL (ref 0.50–1.35)
Calcium: 9 mg/dL (ref 8.4–10.5)
Chloride: 102 mEq/L (ref 96–112)
GFR calc non Af Amer: >90 mL/min (ref >90)
GLUCOSE: 101 mg/dL — AB (ref 70–99)
POTASSIUM: 4.7 meq/L (ref 3.7–5.3)
Sodium: 140 mEq/L (ref 137–147)

## 2014-10-03 LAB — I-STAT CG4 LACTIC ACID, ED
LACTIC ACID, VENOUS: 2.39 mmol/L — AB (ref 0.5–2.2)
Lactic Acid, Venous: 2.13 mmol/L (ref 0.5–2.2)

## 2014-10-03 LAB — PRO B NATRIURETIC PEPTIDE: Pro B Natriuretic peptide (BNP): 222.4 pg/mL — ABNORMAL HIGH (ref 0–125)

## 2014-10-03 LAB — TROPONIN I

## 2014-10-03 LAB — TSH: TSH: 1.43 u[IU]/mL (ref 0.350–4.500)

## 2014-10-03 MED ORDER — HEPARIN SODIUM (PORCINE) 5000 UNIT/ML IJ SOLN
5000.0000 [IU] | Freq: Three times a day (TID) | INTRAMUSCULAR | Status: DC
  Administered 2014-10-03 – 2014-10-05 (×5): 5000 [IU] via SUBCUTANEOUS
  Filled 2014-10-03 (×7): qty 1

## 2014-10-03 MED ORDER — PIPERACILLIN-TAZOBACTAM 3.375 G IVPB
3.3750 g | Freq: Three times a day (TID) | INTRAVENOUS | Status: DC
  Filled 2014-10-03: qty 50

## 2014-10-03 MED ORDER — DILTIAZEM HCL 100 MG IV SOLR
5.0000 mg/h | INTRAVENOUS | Status: DC
  Administered 2014-10-03 – 2014-10-04 (×2): 5 mg/h via INTRAVENOUS

## 2014-10-03 MED ORDER — DILTIAZEM HCL 25 MG/5ML IV SOLN: 10.0000 mg | Freq: Once | INTRAVENOUS | Status: DC

## 2014-10-03 MED ORDER — IOHEXOL 350 MG/ML SOLN
100.0000 mL | Freq: Once | INTRAVENOUS | Status: AC | PRN
  Administered 2014-10-03: 100 mL via INTRAVENOUS

## 2014-10-03 MED ORDER — PIPERACILLIN-TAZOBACTAM 3.375 G IVPB 30 MIN
3.3750 g | Freq: Once | INTRAVENOUS | Status: AC
  Administered 2014-10-03: 3.375 g via INTRAVENOUS

## 2014-10-03 MED ORDER — DILTIAZEM LOAD VIA INFUSION
10.0000 mg | Freq: Once | INTRAVENOUS | Status: AC
  Administered 2014-10-03: 10 mg via INTRAVENOUS
  Filled 2014-10-03: qty 10

## 2014-10-03 MED ORDER — PIPERACILLIN-TAZOBACTAM 3.375 G IVPB
3.3750 g | Freq: Three times a day (TID) | INTRAVENOUS | Status: DC
  Administered 2014-10-04 – 2014-10-05 (×4): 3.375 g via INTRAVENOUS
  Filled 2014-10-03 (×5): qty 50

## 2014-10-03 MED ORDER — METOPROLOL TARTRATE 25 MG PO TABS
25.0000 mg | ORAL_TABLET | Freq: Two times a day (BID) | ORAL | Status: DC
  Administered 2014-10-03: 25 mg via ORAL
  Filled 2014-10-03: qty 1

## 2014-10-03 MED ORDER — SODIUM CHLORIDE 0.9 % IV BOLUS (SEPSIS): 1000.0000 mL | Freq: Once | INTRAVENOUS | Status: DC

## 2014-10-03 MED ORDER — SODIUM CHLORIDE 0.9 % IV BOLUS (SEPSIS)
500.0000 mL | Freq: Once | INTRAVENOUS | Status: AC
  Administered 2014-10-03: 500 mL via INTRAVENOUS

## 2014-10-03 MED ORDER — DILTIAZEM HCL 25 MG/5ML IV SOLN: 15.0000 mg | Freq: Once | INTRAVENOUS | Status: DC

## 2014-10-03 NOTE — Consult Note (Signed)
Reason for Consult: PAF   Referring Physician: Dr. Candiss Norse  PCP: [name], MD Primary Cardiologist:saw Dr. Bertrum Sol for consult in 3/15  Anthony Mcclure is an 63 y.o. male.    Chief Complaint: no specific complaints   HPI: 63 y.o. 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 CT angiogram of the chest was done which was nonacute. His lab work showed mild leukocytosis without any other problems  EKG STach with PACs ? PAT  at times on tele --given IV dilt.  He had a flutter in 02/2014 for a time in an alcohol state.  Troponin is negative. Pt without complaints.   Last echo 08/2014: Left ventricle: Inferobasal hypokinesis. The cavity size was mildly dilated. Wall thickness was normal. Systolic function was normal. The estimated ejection fraction was in the range of 50% to 55%. - Aorta: Moderate dilatation of the sinuses and ascending root No signs of discection suggest CT correlation. - Atrial septum: No defect or patent foramen ovale was identified.  Pt currently without complaints. No chest pain, no SOB.    Past Medical History  Diagnosis Date  . CHF (congestive heart failure)   . Alcohol abuse   . Seizure   . Noncompliance with medication treatment due to intermittent use of medication 02/02/2012  . HTN (hypertension) 02/02/2012  . Alcohol withdrawal seizure 02/02/2012  . Hypertension   . Dysrhythmia     paroxismal atrial fib  . Paroxysmal Atrial Flutter 02/01/2012  .  Hepatitis C 02/02/2012    Unsure if he was ever diagnosed with Hep C  . Hepatitis C   . Quadriplegia     Past Surgical History  Procedure Laterality Date  . Forearm reconstruction      Family History  Problem Relation Age of Onset  . Heart disease Father   . Alcohol abuse Father   . Alcohol abuse Brother   . Diabetes type II Mother    Social History:  reports that he has been smoking.  He has never used smokeless tobacco. He reports that he drinks alcohol. He reports that he uses illicit drugs ("Crack" cocaine).  Allergies: No Known Allergies  OUTPATIENT MEDS: No current facility-administered medications on file prior to encounter.   Current Outpatient Prescriptions on File Prior to Encounter  Medication Sig Dispense Refill  . acetaminophen (TYLENOL) 325 MG tablet Take 2 tablets (650 mg total) by mouth every 6 (six) hours as needed for fever.  30 tablet  0  . ALPRAZolam (XANAX) 1 MG tablet Take 1 tablet (1 mg total) by mouth 3 (three) times daily as needed for anxiety or sleep (agitation).  15 tablet  0  . Amino Acids-Protein Hydrolys (FEEDING SUPPLEMENT, PRO-STAT SUGAR FREE 64,) LIQD Take 30 mLs by mouth 2 (two) times daily.      Marland Kitchen aspirin 325 MG tablet Take 1 tablet (325 mg total) by mouth daily.  30 tablet  0  . chlorhexidine (PERIDEX) 0.12 % solution 15 mLs by Mouth Rinse  route 2 (two) times daily.  314 mL  0  . folic acid (FOLVITE) 1 MG tablet Take 1 mg by mouth daily.      . Multiple Vitamins-Minerals (MULTIVITAMIN WITH MINERALS) tablet Take 1 tablet by mouth daily.      Marland Kitchen senna (SENOKOT) 8.6 MG TABS tablet Take 1 tablet (8.6 mg total) by mouth at bedtime.  120 each  0     Results for orders placed during the hospital encounter of 10/03/14 (from the past 48 hour(s))  BASIC METABOLIC PANEL     Status: Abnormal   Collection Time    10/03/14 11:00 AM      Result Value Ref Range   Sodium 140  137 - 147 mEq/L   Potassium 4.7  3.7 - 5.3 mEq/L   Chloride 102  96 - 112 mEq/L    CO2 27  19 - 32 mEq/L   Glucose, Bld 101 (*) 70 - 99 mg/dL   BUN 13  6 - 23 mg/dL   Creatinine, Ser 0.54  0.50 - 1.35 mg/dL   Calcium 9.0  8.4 - 10.5 mg/dL   GFR calc non Af Amer >90  >90 mL/min   GFR calc Af Amer >90  >90 mL/min   Comment: (NOTE)     The eGFR has been calculated using the CKD EPI equation.     This calculation has not been validated in all clinical situations.     eGFR's persistently <90 mL/min signify possible Chronic Kidney     Disease.   Anion gap 11  5 - 15  PRO B NATRIURETIC PEPTIDE     Status: Abnormal   Collection Time    10/03/14 11:00 AM      Result Value Ref Range   Pro B Natriuretic peptide (BNP) 222.4 (*) 0 - 125 pg/mL  PROTIME-INR     Status: None   Collection Time    10/03/14 11:00 AM      Result Value Ref Range   Prothrombin Time 13.2  11.6 - 15.2 seconds   INR 0.99  0.00 - 1.49  I-STAT TROPOININ, ED     Status: None   Collection Time    10/03/14 11:20 AM      Result Value Ref Range   Troponin i, poc 0.01  0.00 - 0.08 ng/mL   Comment 3            Comment: Due to the release kinetics of cTnI,     a negative result within the first hours     of the onset of symptoms does not rule out     myocardial infarction with certainty.     If myocardial infarction is still suspected,     repeat the test at appropriate intervals.  I-STAT CG4 LACTIC ACID, ED     Status: None   Collection Time    10/03/14 11:25 AM      Result Value Ref Range   Lactic Acid, Venous 2.13  0.5 - 2.2 mmol/L  I-STAT CG4 LACTIC ACID, ED     Status: Abnormal   Collection Time    10/03/14 11:39 AM      Result Value Ref Range   Lactic Acid, Venous 2.39 (*) 0.5 - 2.2 mmol/L  CBC WITH DIFFERENTIAL     Status: Abnormal   Collection Time    10/03/14 11:56 AM      Result Value Ref Range   WBC 12.5 (*) 4.0 - 10.5 K/uL   RBC 4.56  4.22 - 5.81 MIL/uL   Hemoglobin 12.0 (*) 13.0 - 17.0 g/dL   HCT 36.4 (*) 39.0 - 52.0 %   MCV 79.8  78.0 - 100.0 fL   MCH 26.3  26.0 - 34.0 pg    MCHC 33.0  30.0 - 36.0 g/dL   RDW 16.2 (*) 11.5 - 15.5 %   Platelets 257  150 - 400 K/uL   Neutrophils Relative % 71  43 - 77 %   Neutro Abs 8.9 (*) 1.7 - 7.7 K/uL   Lymphocytes Relative 17  12 - 46 %   Lymphs Abs 2.1  0.7 - 4.0 K/uL   Monocytes Relative 10  3 - 12 %   Monocytes Absolute 1.2 (*) 0.1 - 1.0 K/uL   Eosinophils Relative 2  0 - 5 %   Eosinophils Absolute 0.3  0.0 - 0.7 K/uL   Basophils Relative 0  0 - 1 %   Basophils Absolute 0.0  0.0 - 0.1 K/uL   Ct Angio Chest Pe W/cm &/or Wo Cm  10/03/2014   CLINICAL DATA:  Chest pain and shortness of breath; history of hypertension, congestive heart failure, and cardiac dysrhythmia. Also history of alcohol abuse, hepatitis-C and previous episodes of respiratory failure  EXAM: CT ANGIOGRAPHY CHEST WITH CONTRAST  TECHNIQUE: Multidetector CT imaging of the chest was performed using the standard protocol during bolus administration of intravenous contrast. Multiplanar CT image reconstructions and MIPs were obtained to evaluate the vascular anatomy.  CONTRAST:  124m OMNIPAQUE IOHEXOL 350 MG/ML SOLN  COMPARISON:  Portable chest x-ray of today's date.  FINDINGS: Contrast within the pulmonary arterial tree is normal. There are no filling defects to suggest an acute pulmonary embolism. The caliber of the thoracic aorta is normal. The cardiac chambers are mildly enlarged. There is no pericardial effusion. There are coronary artery calcifications. There is no mediastinal nor hilar lymphadenopathy. No acute esophageal abnormality is demonstrated.  There is a mild atelectatic change posteriorly in the right lower lobe. There is no alveolar pneumonia. There is no pleural effusion or pneumothorax.  There is calcification of the anterior longitudinal ligament of the thoracic spine. The thoracic vertebral bodies are preserved in height. The sternum is intact. There are healing fractures of the second through fifth anterior ribs on the left. There are also healing  fractures of the anterior lateral aspects of the second through sixth as well as the eighth ribs on the right.  Within the upper abdomen the observed portions of the liver and spleen are normal.  Review of the MIP images confirms the above findings.  IMPRESSION: 1. There is no acute pulmonary embolism nor acute thoracic aortic pathology. 2. There is minimal atelectasis in the right lower lobe but no evidence of pneumonia nor other acute pulmonary abnormality. 3. There are multiple healing rib fractures bilaterally. There is no evidence of a pneumothorax or pleural effusion.   Electronically Signed   By: David  JMartinique  On: 10/03/2014 16:40   Dg Chest Port 1 View  10/03/2014   CLINICAL DATA:  Chest pain, shortness of breath, sepsis ; history of CHF and hepatitis  EXAM: PORTABLE CHEST - 1 VIEW  COMPARISON:  Portable chest x-ray of September 25, 2014  FINDINGS: The lungs are adequately inflated and clear. The heart and pulmonary vascularity are within the limits of normal. The mediastinum is normal in width. There is no pleural effusion. The bony thorax is unremarkable.  IMPRESSION: There is no acute cardiopulmonary abnormality.   Electronically  Signed   By: David  Martinique   On: 10/03/2014 11:43    ROS: General:no colds or fevers, no weight changes Skin:no rashes +  Ulcers on both elbows and sacrum,  HEENT:no blurred vision, no congestion CV:see HPI PUL:see HPI GI:no diarrhea constipation or melena, no indigestion GU:no hematuria, no dysuria MS:no joint pain, no claudication Neuro:no syncope, no lightheadedness Endo:no diabetes, no thyroid disease   Blood pressure 126/81, pulse 96, temperature 99.9 F (37.7 C), temperature source Rectal, resp. rate 20, SpO2 100.00%. PE: General:Pleasant affect, NAD Skin:Warm and dry, brisk capillary refill HEENT:normocephalic, sclera clear, mucus membranes moist Neck:supple, no JVD, no bruits  Heart:S1S2 irreg at times without murmur, gallup, rub or  click Lungs:clear ant.  without rales, rhonchi, or wheezes PHK:FEXM, non tender, + BS, do not palpate liver spleen or masses Ext:no lower ext edema,1+ pedal pulses, 2+ radial pulses-quadriplegia  Neuro:alert and oriented,  follows commands, + facial symmetry    Assessment/Plan 1.  Tachycardia, ST at times possible PAT- continue IV dilt tonight and plan to change to po in am.  He has had 1 L of fluid. Negative PE, by CTA.  Neg troponin.  2. Leukocytosis, does have foley cath and has ulcers on elbows and sacrum.  3.  HTN stable  4. Spinal cord infarct with quadriplegia- at time of arrest  5.  Decubitus   6. Hx of seizures   Will follow  [facility]  Nurse Practitioner Certified [place] Pager [phone] or after 5pm or weekends call [phone] 10/03/2014, 5:22 PM  The patient was seen in the emergency room with Cecilie Kicks NP-C.  We are asked to see him because of his cardiac arrhythmia.  His electrocardiogram and rhythm strips were reviewed.  His rhythm is normal sinus rhythm with frequent PACs and runs of PACs.  No evidence of atrial flutter.  The electrocardiogram shows that some of the PACs are conducted aberrantly. The patient denies any chest pain or shortness of breath.  He is not aware of his rapid heart rate.  He denies any cough or sputum production.  He denies any difficulty swallowing. His exam reveals clear lung fields.  The heart reveals no gallop.  The abdomen is scaphoid without masses or tenderness.  Extremities show no evidence of phlebitis.  His feet are covered with large padded boots for prevention of pressure ulcers from his quadriplegia. Agree with the workup as planned by hospitalist service.  There is no evidence of acute myocardial infarction.  The CT angiogram of the chest shows no dissection or pulmonary embolus. Would continue IV diltiazem overnight and then consider switch to oral diltiazem empirically for his frequent premature  atrial beats.  If he fails to respond to diltiazem adequately, beta blocker could also be employed instead.  His last echocardiogram on 08/17/14 showed low normal left ventricular systolic function with ejection fraction of 50-55%.

## 2014-10-03 NOTE — ED Notes (Signed)
To ED via [place] from [facility] with initial c/o chest pain--and shortness of breath

## 2014-10-03 NOTE — H&P (Signed)
Patient Demographics  Anthony Mcclure, is a 63 y.o. male  MRN: [id number]   DOB - 12-30-1950  Admit Date - 10/03/2014  Outpatient Primary MD for the patient is [name], MD   With History of -  Past Medical History  Diagnosis Date  . CHF (congestive heart failure)   . Alcohol abuse   . Seizure   . Noncompliance with medication treatment due to intermittent use of medication 02/02/2012  . HTN (hypertension) 02/02/2012  . Alcohol withdrawal seizure 02/02/2012  . Hypertension   . Dysrhythmia     paroxismal atrial fib  . Paroxysmal Atrial Flutter 02/01/2012  . Hepatitis C 02/02/2012    Unsure if he was ever diagnosed with Hep C  . Hepatitis C   . Quadriplegia       Past Surgical History  Procedure Laterality Date  . Forearm reconstruction      in for   Chief Complaint  Patient presents with  . Shortness of Breath     HPI  Anthony Mcclure  is a 63 y.o. 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 CT angiogram of the chest was done which was nonacute. His lab work showed mild leukocytosis without any other problems, I was called to admit the patient for atrial flutter/sinus tachycardia.    Review of Systems -ve ROS   In addition to the HPI above,  No Fever-chills, No Headache, No changes with Vision or  hearing, No problems swallowing food or Liquids, No Chest pain, Cough or Shortness of Breath, No Abdominal pain, No Nausea or Vommitting, Bowel movements are regular, No Blood in stool or Urine, No dysuria, No new skin rashes or bruises, No new joints pains-aches,  No new weakness, tingling, numbness in any extremity, No recent weight gain or loss, No polyuria, polydypsia or polyphagia, No significant Mental Stressors.  A full 10 point Review of Systems was done, except as stated above, all other Review of Systems were negative.   Social History History  Substance Use Topics  . Smoking status: Light Tobacco Smoker  . Smokeless tobacco: Never Used  . Alcohol Use: Yes     Comment: statrts drinking everyday at 5am drinks about 2 fiths been drinking for about 10 years      Family History Family History  Problem Relation Age of Onset  . Heart disease Father   . Alcohol abuse Father   . Alcohol abuse Brother   . Diabetes type II Mother  Prior to Admission medications   Medication Sig Start Date End Date Taking? Authorizing Provider  acetaminophen (TYLENOL) 325 MG tablet Take 2 tablets (650 mg total) by mouth every 6 (six) hours as needed for fever. 08/31/14  Yes Belkys A Regalado, MD  ALPRAZolam (XANAX) 1 MG tablet Take 1 tablet (1 mg total) by mouth 3 (three) times daily as needed for anxiety or sleep (agitation). 08/31/14  Yes Belkys A Regalado, MD  Amino Acids-Protein Hydrolys (FEEDING SUPPLEMENT, PRO-STAT SUGAR FREE 64,) LIQD Take 30 mLs by mouth 2 (two) times daily.   Yes Historical Provider, MD  aspirin 325 MG tablet Take 1 tablet (325 mg total) by mouth daily. 08/31/14  Yes Belkys A Regalado, MD  chlorhexidine (PERIDEX) 0.12 % solution 15 mLs by Mouth Rinse route 2 (two) times daily. 08/31/14  Yes Belkys A Regalado, MD  folic acid (FOLVITE) 1 MG tablet Take 1 mg by mouth daily.   Yes Historical Provider, MD  metoprolol tartrate (LOPRESSOR) 25 MG tablet Take 25 mg by  mouth 2 (two) times daily.   Yes Historical Provider, MD  Multiple Vitamins-Minerals (MULTIVITAMIN WITH MINERALS) tablet Take 1 tablet by mouth daily.   Yes Historical Provider, MD  senna (SENOKOT) 8.6 MG TABS tablet Take 1 tablet (8.6 mg total) by mouth at bedtime. 08/31/14  Yes Belkys A Regalado, MD  thiamine (VITAMIN B-1) 100 MG tablet Take 100 mg by mouth daily.   Yes Historical Provider, MD    No Known Allergies  Physical Exam  Vitals  Blood pressure 126/81, pulse 96, temperature 99.9 F (37.7 C), temperature source Rectal, resp. rate 20, SpO2 100.00%.   1. General middle-aged African American male lying in bed in NAD,     2. Normal affect and insight, Not Suicidal or Homicidal, Awake Alert, Oriented X 3.  3. baseline quadriplegic.  4. Ears and Eyes appear Normal, Conjunctivae clear, PERRLA. Moist Oral Mucosa.  5. Supple Neck, No JVD, No cervical lymphadenopathy appriciated, No Carotid Bruits.  6. Symmetrical Chest wall movement, Good air movement bilaterally, CTAB.  7. RRR, No Gallops, Rubs or Murmurs, No Parasternal Heave.  8. Positive Bowel Sounds, Abdomen Soft, No tenderness, No organomegaly appriciated,No rebound -guarding or rigidity.  9.  No Cyanosis, Normal Skin Turgor, No Skin Rash or Bruise. Wearing pressure/contracture boots in both lower extremities  10. Good muscle tone,  joints appear normal , no effusions, Normal ROM.  11. No Palpable Lymph Nodes in Neck or Axillae     Data Review  CBC  Recent Labs Lab 10/03/14 1156  WBC 12.5*  HGB 12.0*  HCT 36.4*  PLT 257  MCV 79.8  MCH 26.3  MCHC 33.0  RDW 16.2*  LYMPHSABS 2.1  MONOABS 1.2*  EOSABS 0.3  BASOSABS 0.0   ------------------------------------------------------------------------------------------------------------------  Chemistries   Recent Labs Lab 10/03/14 1100  NA 140  K 4.7  CL 102  CO2 27  GLUCOSE 101*  BUN 13  CREATININE 0.54  CALCIUM 9.0    ------------------------------------------------------------------------------------------------------------------ CrCl is unknown because both a height and weight (above a minimum accepted value) are required for this calculation. ------------------------------------------------------------------------------------------------------------------ No results found for this basename: TSH, T4TOTAL, FREET3, T3FREE, THYROIDAB,  in the last 72 hours   Coagulation profile  Recent Labs Lab 10/03/14 1100  INR 0.99   ------------------------------------------------------------------------------------------------------------------- No results found for this basename: DDIMER,  in the last 72 hours -------------------------------------------------------------------------------------------------------------------  Cardiac Enzymes No results found for this basename: CK, CKMB, TROPONINI, MYOGLOBIN,  in the last 168 hours ------------------------------------------------------------------------------------------------------------------  No components found with this basename: POCBNP,    ---------------------------------------------------------------------------------------------------------------  Urinalysis    Component Value Date/Time   COLORURINE AMBER* 09/25/2014 2051   APPEARANCEUR CLEAR 09/25/2014 2051   LABSPEC 1.029 09/25/2014 2051   PHURINE 5.5 09/25/2014 2051   [place] 09/25/2014 2051   [place] NEGATIVE 09/25/2014 2051   [place] NEGATIVE 09/25/2014 2051   [place] 09/25/2014 2051   PROTEINUR NEGATIVE 09/25/2014 2051   UROBILINOGEN 2.0* 09/25/2014 2051   NITRITE NEGATIVE 09/25/2014 2051   LEUKOCYTESUR SMALL* 09/25/2014 2051    ----------------------------------------------------------------------------------------------------------------  Imaging results:   Ct Angio Chest Pe W/cm &/or Wo Cm  10/03/2014   CLINICAL DATA:  Chest pain and shortness of breath;  history of hypertension, congestive heart failure, and cardiac dysrhythmia. Also history of alcohol abuse, hepatitis-C and previous episodes of respiratory failure  EXAM: CT ANGIOGRAPHY CHEST WITH CONTRAST  TECHNIQUE: Multidetector CT imaging of the chest was performed using the standard protocol during bolus administration of intravenous contrast. Multiplanar CT image reconstructions and MIPs were obtained to evaluate the vascular anatomy.  CONTRAST:  146mL OMNIPAQUE IOHEXOL 350 MG/ML SOLN  COMPARISON:  Portable chest x-ray of today's date.  FINDINGS: Contrast within the pulmonary arterial tree is normal. There are no filling defects to suggest an acute pulmonary embolism. The caliber of the thoracic aorta is normal. The cardiac chambers are mildly enlarged. There is no pericardial effusion. There are coronary artery calcifications. There is no mediastinal nor hilar lymphadenopathy. No acute esophageal abnormality is demonstrated.  There is a mild atelectatic change posteriorly in the right lower lobe. There is no alveolar pneumonia. There is no pleural effusion or pneumothorax.  There is calcification of the anterior longitudinal ligament of the thoracic spine. The thoracic vertebral bodies are preserved in height. The sternum is intact. There are healing fractures of the second through fifth anterior ribs on the left. There are also healing fractures of the anterior lateral aspects of the second through sixth as well as the eighth ribs on the right.  Within the upper abdomen the observed portions of the liver and spleen are normal.  Review of the MIP images confirms the above findings.  IMPRESSION: 1. There is no acute pulmonary embolism nor acute thoracic aortic pathology. 2. There is minimal atelectasis in the right lower lobe but no evidence of pneumonia nor other acute pulmonary abnormality. 3. There are multiple healing rib fractures bilaterally. There is no evidence of a pneumothorax or pleural effusion.    Electronically Signed   By: David  Martinique   On: 10/03/2014 16:40   Dg Chest Port 1 View  10/03/2014   CLINICAL DATA:  Chest pain, shortness of breath, sepsis ; history of CHF and hepatitis  EXAM: PORTABLE CHEST - 1 VIEW  COMPARISON:  Portable chest x-ray of September 25, 2014  FINDINGS: The lungs are adequately inflated and clear. The heart and pulmonary vascularity are within the limits of normal. The mediastinum is normal in width. There is no pleural effusion. The bony thorax is unremarkable.  IMPRESSION: There is no acute cardiopulmonary abnormality.   Electronically Signed   By: David  Martinique   On: 10/03/2014 11:43    My personal review of EKG: Rhythm sinus pericardium with a regular rate versus atrial flutter, Rate  140s/min, nonspecific ST changes    Assessment & Plan    1. Atrial flutter was a sinus tachycardia with a regular conduction. He has had history of PEA cardiac arrest recently without any clear etiology identified.  We'll keep him in stepped-down, will place him on IV Cardizem with infusion, heart rate in the 140s, give him IV fluid bolus, continue aspirin, recent echo reviewed with EF of 55% mild LVH, check TSH, cardiology requested to evaluate. Question if this is sinus tachycardia reactive to underlying stress.    2. Leukocytosis. Etiology unclear, CT scan shows mild atelectasis, he denies any aspiration or choking on food, however aspiration and dysphagia have been a problem in the past. For now we'll put him on dysphagia 3 diet with feeding assistance aspiration precautions, have speech reevaluate, Zosyn for now. Could be mild URI versus early aspiration pneumonia, will also obtain UA to rule out UTI. Check bladder scan as well to rule out retention.    3. Essential hypertension. Stable no acute issues continue  IV Cardizem for now.    4. Spinal cord infarct with quadriplegia. Supportive care.    5. Early stage II sacral decubitus ulcer. Skin care.    6. History  of hepatitis C. No acute issues outpatient follow with GI and ID.    7. History of PEA cardiac arrest with unclear etiology. Recent echo gram unremarkable with a preserved EF of 55% and mild LVH he did monitor on telemetry, cardiology to evaluate for #1 above.    DVT Prophylaxis Heparin   AM Labs Ordered, also please review Full Orders  Family Communication: Admission, patients condition and plan of care including tests being ordered have been discussed with the patient   who indicates understanding and agree with the plan and Code Status.  Code Status DNR  Likely DC to  SNF  Condition GUARDED   Time spent in minutes : 35    SINGH,PRASHANT K M.D on 10/03/2014 at 4:50 PM  Between 7am to 7pm - Pager - [phone]  After 7pm go to [email or web address] - password [email or web address]  And look for the night coverage person covering me after hours  [facility] Office  [phone]

## 2014-10-03 NOTE — ED Notes (Signed)
i-STAT CG4+ of 2.39 was shown to Dr. Christy Gentles.

## 2014-10-03 NOTE — ED Notes (Signed)
Attempted IV access unable paged and spoke with IV team who will attempt.

## 2014-10-03 NOTE — ED Notes (Signed)
Family at bedside, given update. All questions answered.

## 2014-10-03 NOTE — ED Provider Notes (Signed)
CSN: [id number]     Arrival date & time 10/03/14  1020 History   First MD Initiated Contact with Patient 10/03/14 1112     Chief Complaint  Patient presents with  . Shortness of Breath   LEVEL 5 CAVEAT - ACUITY OF CONDITION  Patient is a 63 y.o. male presenting with shortness of breath. The history is provided by the patient. The history is limited by the condition of the patient.  Shortness of Breath Severity:  Moderate Timing:  Constant Progression:  Unchanged Relieved by:  Nothing Patient presents from nursing home via EMS for shortness of breath Initial reports were he had CP, but now only reports SOB His history is difficult as patient has dysarthria.  However, he is able to indicate yes/no and denies chest pain and denies abd pain.  He reports shortness of breath but is unable to indicate how long this has been present.    Past Medical History  Diagnosis Date  . CHF (congestive heart failure)   . Alcohol abuse   . Seizure   . Noncompliance with medication treatment due to intermittent use of medication 02/02/2012  . HTN (hypertension) 02/02/2012  . Alcohol withdrawal seizure 02/02/2012  . Hypertension   . Dysrhythmia     paroxismal atrial fib  . Paroxysmal Atrial Flutter 02/01/2012  . Hepatitis C 02/02/2012    Unsure if he was ever diagnosed with Hep C  . Hepatitis C   . Quadriplegia    Past Surgical History  Procedure Laterality Date  . Forearm reconstruction     Family History  Problem Relation Age of Onset  . Heart disease Father   . Alcohol abuse Father   . Alcohol abuse Brother   . Diabetes type II Mother    History  Substance Use Topics  . Smoking status: Light Tobacco Smoker  . Smokeless tobacco: Never Used  . Alcohol Use: Yes     Comment: statrts drinking everyday at 5am drinks about 2 fiths been drinking for about 10 years    Review of Systems  Unable to perform ROS: Acuity of condition  Respiratory: Positive for shortness of breath.        Allergies  Review of patient's allergies indicates no known allergies.  Home Medications   Prior to Admission medications   Medication Sig Start Date End Date Taking? Authorizing Provider  acetaminophen (TYLENOL) 325 MG tablet Take 2 tablets (650 mg total) by mouth every 6 (six) hours as needed for fever. 08/31/14   Belkys A Regalado, MD  ALPRAZolam (XANAX) 1 MG tablet Take 1 tablet (1 mg total) by mouth 3 (three) times daily as needed for anxiety or sleep (agitation). 08/31/14   Belkys A Regalado, MD  Amino Acids-Protein Hydrolys (FEEDING SUPPLEMENT, PRO-STAT SUGAR FREE 64,) LIQD Take 30 mLs by mouth 2 (two) times daily.    Historical Provider, MD  aspirin 325 MG tablet Take 1 tablet (325 mg total) by mouth daily. 08/31/14   Belkys A Regalado, MD  chlorhexidine (PERIDEX) 0.12 % solution 15 mLs by Mouth Rinse route 2 (two) times daily. 08/31/14   Belkys A Regalado, MD  folic acid (FOLVITE) 1 MG tablet Take 1 mg by mouth daily.    Historical Provider, MD  metoprolol (LOPRESSOR) 50 MG tablet Take 1 tablet (50 mg total) by mouth 2 (two) times daily. 09/25/14   Richarda Blade, MD  Multiple Vitamins-Minerals (MULTIVITAMIN WITH MINERALS) tablet Take 1 tablet by mouth daily.    Historical Provider, MD  senna (SENOKOT) 8.6 MG TABS tablet Take 1 tablet (8.6 mg total) by mouth at bedtime. 08/31/14   Belkys A Regalado, MD   BP 119/95  Pulse 148  Temp(Src) 98.3 F (36.8 C) (Oral)  Resp 23  SpO2 98% Physical Exam CONSTITUTIONAL: ill appearing, appears older than stated age HEAD: Normocephalic/atraumatic EYES: EOMI/PERRL ENMT: Mucous membranes dry Spine - no tenderness noted CV: tachycardic LUNGS: mild tachpnea noted.  Coarse BS noted bilateraly ABDOMEN: soft, nontender, no rebound or guarding GU:no cva tenderness, foley in place on arrival to the ER NEURO: Pt is awake/alert he has flaccid paralysis of all extremities EXTREMITIES: pulses normal, full ROM No wounds noted to either  foot SKIN: warm, color normal PSYCH: no abnormalities of mood noted  ED Course  Procedures   11:34 AM Pt is from nursing home for SOB. He has h/o previous cardiac arrest and resultant quadriplegia His history is difficulty He appears to have runs of atrial fibrillation 1:45 PM Pt continues to be tachycardic more consistently More c/w sinus tach With reported h/o CP/SOB and pt is bed bound, will proceed with evaluation of acute PE   Labs Review Labs Reviewed  BASIC METABOLIC PANEL - Abnormal; Notable for the following:    Glucose, Bld 101 (*)    All other components within normal limits  PRO B NATRIURETIC PEPTIDE - Abnormal; Notable for the following:    Pro B Natriuretic peptide (BNP) 222.4 (*)    All other components within normal limits  CBC WITH DIFFERENTIAL - Abnormal; Notable for the following:    WBC 12.5 (*)    Hemoglobin 12.0 (*)    HCT 36.4 (*)    RDW 16.2 (*)    Neutro Abs 8.9 (*)    Monocytes Absolute 1.2 (*)    All other components within normal limits  I-STAT CG4 LACTIC ACID, ED - Abnormal; Notable for the following:    Lactic Acid, Venous 2.39 (*)    All other components within normal limits  CULTURE, BLOOD (ROUTINE X 2)  CULTURE, BLOOD (ROUTINE X 2)  PROTIME-INR  I-STAT TROPOININ, ED  I-STAT CG4 LACTIC ACID, ED    Imaging Review Dg Chest Port 1 View  10/03/2014   CLINICAL DATA:  Chest pain, shortness of breath, sepsis ; history of CHF and hepatitis  EXAM: PORTABLE CHEST - 1 VIEW  COMPARISON:  Portable chest x-ray of September 25, 2014  FINDINGS: The lungs are adequately inflated and clear. The heart and pulmonary vascularity are within the limits of normal. The mediastinum is normal in width. There is no pleural effusion. The bony thorax is unremarkable.  IMPRESSION: There is no acute cardiopulmonary abnormality.   Electronically Signed   By: David  Martinique   On: 10/03/2014 11:43     EKG Interpretation   Date/Time:  Wednesday October 03 2014 10:35:23  EDT Ventricular Rate:  107 PR Interval:    QRS Duration: 99 QT Interval:  340 QTC Calculation: 536 R Axis:   49 Text Interpretation:  Sinus rhythm Non-specific ST-t changes Paired  ventricular premature complexes Confirmed by Christy Gentles  MD, Yalonda Sample ([id number])  on 10/03/2014 11:11:39 AM      EKG Interpretation  Date/Time:  Wednesday October 03 2014 13:17:30 EDT Ventricular Rate:  146 PR Interval:  106 QRS Duration: 98 QT Interval:  302 QTC Calculation: 471 R Axis:   79 Text Interpretation:  Sinus tachycardia with irregular rate Borderline low voltage, extremity leads Confirmed by Christy Gentles  MD, Lacye Mccarn ([id number]) on 10/03/2014 1:42:48 PM  MDM   Final diagnoses:  None    Nursing notes including past medical history and social history reviewed and considered in documentation xrays reviewed and considered Labs/vital reviewed and considered Previous records reviewed and considered     Sharyon Cable, MD 10/03/14 1346

## 2014-10-03 NOTE — Progress Notes (Signed)
ANTIBIOTIC CONSULT NOTE - INITIAL  Pharmacy Consult for zosyn Indication: aspiration pneumonia  No Known Allergies  Patient Measurements:  Vital Signs: Temp: 99.9 F (37.7 C) (10/21 1151) Temp Source: Rectal (10/21 1151) BP: 126/81 mmHg (10/21 1648) Pulse Rate: 96 (10/21 1648) Intake/Output from previous day:   Intake/Output from this shift:    Labs:  Recent Labs  10/03/14 1100 10/03/14 1156  WBC  --  12.5*  HGB  --  12.0*  PLT  --  257  CREATININE 0.54  --    The CrCl is unknown because both a height and weight (above a minimum accepted value) are required for this calculation. No results found for this basename: Letta Median, VANCORANDOM, GENTTROUGH, GENTPEAK, GENTRANDOM, TOBRATROUGH, TOBRAPEAK, TOBRARND, AMIKACINPEAK, AMIKACINTROU, AMIKACIN,  in the last 72 hours   Microbiology: Recent Results (from the past 720 hour(s))  URINE CULTURE     Status: None   Collection Time    09/25/14  8:51 PM      Result Value Ref Range Status   Specimen Description URINE, RANDOM   Final   Special Requests NONE   Final   Culture  Setup Time     Final   Value: 09/25/2014 23:04     Performed at [place]     Final   Value: NO GROWTH     Performed at [facility]   Culture     Final   Value: NO GROWTH     Performed at [facility]   Report Status 09/27/2014 FINAL   Final    Medical History: Past Medical History  Diagnosis Date  . CHF (congestive heart failure)   . Alcohol abuse   . Seizure   . Noncompliance with medication treatment due to intermittent use of medication 02/02/2012  . HTN (hypertension) 02/02/2012  . Alcohol withdrawal seizure 02/02/2012  . Hypertension   . Dysrhythmia     paroxismal atrial fib  . Paroxysmal Atrial Flutter 02/01/2012  . Hepatitis C 02/02/2012    Unsure if he was ever diagnosed with Hep C  . Hepatitis C   . Quadriplegia    Assessment: 63 YO quadriplegic male from a SNF brought to the  ED with tachycardia, sob, leukocytosis. Pt. Had history of aspiration and dysphagia, pharmacy is consulted to start empiric zosyn. Tm 99.9, wbc 12.5. Scr 0.54  Zosyn 10/21 >>  10/21 blood x 2 10/21 urine   Plan:  - Start zosyn 3.375g IV Q 8 hrs - f/u cultures and renal function  Maryanna Shape, PharmD, BCPS  Clinical Pharmacist  Pager: [phone]   10/03/2014,5:00 PM

## 2014-10-03 NOTE — ED Notes (Signed)
IV team unable to get second IV. Dr Reece Levy paged.

## 2014-10-03 NOTE — ED Notes (Signed)
Nurse attempted again established IV left AC 22g.

## 2014-10-03 NOTE — ED Provider Notes (Signed)
At signout to dr Reather Converse, f/u on CT PE study If negative and tachycardic, will need admission Pt awake/alert, resting comfortably  Sharyon Cable, MD 10/03/14 [phone]

## 2014-10-03 NOTE — ED Notes (Signed)
Patient transported to CT 

## 2014-10-03 NOTE — ED Notes (Signed)
IV team at bedside 

## 2014-10-04 ENCOUNTER — Encounter (HOSPITAL_COMMUNITY): Payer: Self-pay | Admitting: General Practice

## 2014-10-04 DIAGNOSIS — I483 Typical atrial flutter: Secondary | ICD-10-CM

## 2014-10-04 DIAGNOSIS — D72829 Elevated white blood cell count, unspecified: Secondary | ICD-10-CM

## 2014-10-04 DIAGNOSIS — R0789 Other chest pain: Secondary | ICD-10-CM

## 2014-10-04 DIAGNOSIS — G9519 Other vascular myelopathies: Secondary | ICD-10-CM

## 2014-10-04 DIAGNOSIS — R002 Palpitations: Secondary | ICD-10-CM

## 2014-10-04 DIAGNOSIS — B1920 Unspecified viral hepatitis C without hepatic coma: Secondary | ICD-10-CM

## 2014-10-04 DIAGNOSIS — I10 Essential (primary) hypertension: Secondary | ICD-10-CM

## 2014-10-04 LAB — CBC
HCT: 35 % — ABNORMAL LOW (ref 39.0–52.0)
Hemoglobin: 11.4 g/dL — ABNORMAL LOW (ref 13.0–17.0)
MCH: 26.7 pg (ref 26.0–34.0)
MCHC: 32.6 g/dL (ref 30.0–36.0)
MCV: 82 fL (ref 78.0–100.0)
PLATELETS: 268 10*3/uL (ref 150–400)
RBC: 4.27 MIL/uL (ref 4.22–5.81)
RDW: 16.4 % — AB (ref 11.5–15.5)
WBC: 10.1 10*3/uL (ref 4.0–10.5)

## 2014-10-04 LAB — BASIC METABOLIC PANEL
ANION GAP: 11 (ref 5–15)
BUN: 10 mg/dL (ref 6–23)
CALCIUM: 8.8 mg/dL (ref 8.4–10.5)
CO2: 25 mEq/L (ref 19–32)
Chloride: 101 mEq/L (ref 96–112)
Creatinine, Ser: 0.58 mg/dL (ref 0.50–1.35)
Glucose, Bld: 90 mg/dL (ref 70–99)
Potassium: 4.2 mEq/L (ref 3.7–5.3)
SODIUM: 137 meq/L (ref 137–147)

## 2014-10-04 LAB — TROPONIN I: Troponin I: <0.3 ng/mL (ref <0.30)

## 2014-10-04 LAB — MRSA PCR SCREENING: MRSA BY PCR: POSITIVE — AB

## 2014-10-04 LAB — CBG MONITORING, ED: Glucose-Capillary: 100 mg/dL — ABNORMAL HIGH (ref 70–99)

## 2014-10-04 MED ORDER — HYDROCODONE-ACETAMINOPHEN 5-325 MG PO TABS: 1.0000 | ORAL_TABLET | ORAL | Status: DC | PRN

## 2014-10-04 MED ORDER — ALPRAZOLAM 0.5 MG PO TABS
1.0000 mg | ORAL_TABLET | Freq: Three times a day (TID) | ORAL | Status: DC | PRN
Start: 2014-10-04 — End: 2014-10-05

## 2014-10-04 MED ORDER — BACITRACIN ZINC 500 UNIT/GM EX OINT: TOPICAL_OINTMENT | Freq: Two times a day (BID) | CUTANEOUS | Status: DC

## 2014-10-04 MED ORDER — SODIUM CHLORIDE 0.9 % IJ SOLN
3.0000 mL | Freq: Two times a day (BID) | INTRAMUSCULAR | Status: DC
  Administered 2014-10-04 – 2014-10-05 (×2): 3 mL via INTRAVENOUS

## 2014-10-04 MED ORDER — GUAIFENESIN-DM 100-10 MG/5ML PO SYRP: 5.0000 mL | ORAL_SOLUTION | ORAL | Status: DC | PRN

## 2014-10-04 MED ORDER — CHLORHEXIDINE GLUCONATE CLOTH 2 % EX PADS: 6.0000 | MEDICATED_PAD | Freq: Every day | CUTANEOUS | Status: DC

## 2014-10-04 MED ORDER — METOPROLOL TARTRATE 25 MG PO TABS
25.0000 mg | ORAL_TABLET | Freq: Two times a day (BID) | ORAL | Status: DC
  Administered 2014-10-04 – 2014-10-05 (×3): 25 mg via ORAL
  Filled 2014-10-04 (×4): qty 1

## 2014-10-04 MED ORDER — MUPIROCIN 2 % EX OINT
1.0000 "application " | TOPICAL_OINTMENT | Freq: Two times a day (BID) | CUTANEOUS | Status: DC
  Administered 2014-10-04 – 2014-10-05 (×2): 1 via NASAL
  Filled 2014-10-04: qty 22

## 2014-10-04 MED ORDER — ASPIRIN 325 MG PO TABS
325.0000 mg | ORAL_TABLET | Freq: Every day | ORAL | Status: DC
  Administered 2014-10-04 – 2014-10-05 (×2): 325 mg via ORAL
  Filled 2014-10-04 (×2): qty 1

## 2014-10-04 MED ORDER — SENNA 8.6 MG PO TABS
1.0000 | ORAL_TABLET | Freq: Every day | ORAL | Status: DC
  Administered 2014-10-04: 8.6 mg via ORAL
  Filled 2014-10-04 (×3): qty 1

## 2014-10-04 MED ORDER — PRO-STAT SUGAR FREE PO LIQD
30.0000 mL | Freq: Two times a day (BID) | ORAL | Status: DC
  Administered 2014-10-04: 30 mL via ORAL
  Filled 2014-10-04 (×2): qty 30

## 2014-10-04 MED ORDER — CHLORHEXIDINE GLUCONATE CLOTH 2 % EX PADS
6.0000 | MEDICATED_PAD | Freq: Every day | CUTANEOUS | Status: DC
  Administered 2014-10-05: 6 via TOPICAL

## 2014-10-04 MED ORDER — ONDANSETRON HCL 4 MG PO TABS: 4.0000 mg | ORAL_TABLET | Freq: Four times a day (QID) | ORAL | Status: DC | PRN

## 2014-10-04 MED ORDER — INFLUENZA VAC SPLIT QUAD 0.5 ML IM SUSY
0.5000 mL | PREFILLED_SYRINGE | INTRAMUSCULAR | Status: AC
  Administered 2014-10-05: 0.5 mL via INTRAMUSCULAR
  Filled 2014-10-04: qty 0.5

## 2014-10-04 MED ORDER — SODIUM CHLORIDE 0.9 % IV SOLN: INTRAVENOUS | Status: DC

## 2014-10-04 MED ORDER — DILTIAZEM HCL ER COATED BEADS 120 MG PO CP24
120.0000 mg | ORAL_CAPSULE | Freq: Every day | ORAL | Status: DC
  Administered 2014-10-04 – 2014-10-05 (×2): 120 mg via ORAL
  Filled 2014-10-04 (×2): qty 1

## 2014-10-04 MED ORDER — CHLORHEXIDINE GLUCONATE 0.12 % MT SOLN
15.0000 mL | Freq: Two times a day (BID) | OROMUCOSAL | Status: DC
  Administered 2014-10-04 (×2): 15 mL via OROMUCOSAL
  Filled 2014-10-04 (×4): qty 15

## 2014-10-04 MED ORDER — VITAMIN B-1 100 MG PO TABS
100.0000 mg | ORAL_TABLET | Freq: Every day | ORAL | Status: DC
  Administered 2014-10-04 – 2014-10-05 (×2): 100 mg via ORAL
  Filled 2014-10-04 (×2): qty 1

## 2014-10-04 MED ORDER — POLYETHYLENE GLYCOL 3350 17 G PO PACK
17.0000 g | PACK | Freq: Every day | ORAL | Status: DC | PRN
  Filled 2014-10-04: qty 1

## 2014-10-04 MED ORDER — ONDANSETRON HCL 4 MG/2ML IJ SOLN: 4.0000 mg | Freq: Four times a day (QID) | INTRAMUSCULAR | Status: DC | PRN

## 2014-10-04 MED ORDER — ALUM & MAG HYDROXIDE-SIMETH 200-200-20 MG/5ML PO SUSP: 30.0000 mL | Freq: Four times a day (QID) | ORAL | Status: DC | PRN

## 2014-10-04 MED ORDER — FOLIC ACID 1 MG PO TABS
1.0000 mg | ORAL_TABLET | Freq: Every day | ORAL | Status: DC
Start: 2014-10-04 — End: 2014-10-05
  Administered 2014-10-04 – 2014-10-05 (×2): 1 mg via ORAL
  Filled 2014-10-04 (×2): qty 1

## 2014-10-04 NOTE — Progress Notes (Signed)
[facility] TEAM 1 - Stepdown/ICU TEAM Progress Note  Anthony Mcclure OXB:[id number] DOB: 09/17/1951 DOA: 10/03/2014 PCP: [name], MD  Admit HPI / Brief Narrative: Anthony Mcclure is a 63 y.o. 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 CT angiogram of the chest was done which was nonacute. His lab work showed mild leukocytosis without any other problems, I was called to admit the patient for atrial flutter/sinus tachycardia.   HPI/Subjective: 10/22 A./O. x2 (does not know when, why) dysarthria, Sirs nonsensically (baseline?)  Assessment/Plan: Atrial flutter  -Currently in NSR  -Cardiology evaluating  -Cardizem XL 120 mg per cardiology  -Continue metoprolol 25 mg BID per cardiology  - recent echo reviewed with EF of 55% mild LVH,  -TSH, within normal limit . Leukocytosis. - Etiology unclear, CT scan shows mild atelectasis; aspiration?, aspiration and dysphagia have been a problem in the past.  -N.p.o. speech to evaluate for dysphasia -Continue  Zosyn   Essential hypertension.  -Stable   Spinal cord infarct with quadriplegia.  -Supportive care.   Early stage II sacral decubitus ulcer. - Skin care.   History of hepatitis C.  -No acute issues outpatient follow with GI and ID.   History of PEA cardiac arrest with unclear etiology. -Stable - Recent echo gram unremarkable with a preserved EF of 55% and mild LVH he did monitor on telemetry   Code Status: FULL Family Communication: no family  present at time of exam Disposition Plan: Discharge in a.m.   Consultants: Dr. Shelva Majestic (cardiology)  Procedure/Significant Events:    Culture 10/21 blood right/left antecubital NGTD 10/21 urine pending 10/22 or so by PCR positive    Antibiotics: Zosyn 10/22>>   DVT prophylaxis: Subcutaneous heparin   Devices    LINES / TUBES:      Continuous Infusions:   Objective: VITAL SIGNS: Temp: 98.1 F (36.7 C) (10/22 1420) Temp Source: Oral (10/22 1420) BP: 123/77 mmHg (10/22 1330) Pulse Rate: 71 (10/22 1330) SPO2; FIO2:   Intake/Output Summary (Last 24 hours) at 10/04/14 1527 Last data filed at 10/04/14 0251  Gross per 24 hour  Intake      0 ml  Output   1000 ml  Net  -1000 ml     Exam: General: A./O. x2 (did not know when, why), NAD No acute respiratory distress Lungs: Clear to auscultation bilaterally without wheezes or crackles Cardiovascular: Irregular irregular rhythm and rate, without murmur gallop or rub normal S1 and S2 Abdomen: Nontender, nondistended, soft, bowel sounds positive, no rebound, no ascites, no appreciable mass Extremities: No significant cyanosis, clubbing, or edema bilateral lower extremities  Data Reviewed: Basic Metabolic Panel:  Recent Labs Lab 10/03/14 1100 10/04/14 0646  NA 140 137  K 4.7 4.2  CL 102 101  CO2 27 25  GLUCOSE 101* 90  BUN 13 10  CREATININE 0.54 0.58  CALCIUM 9.0 8.8   Liver Function Tests: No results found for this basename: AST, ALT, ALKPHOS, BILITOT, PROT, ALBUMIN,  in the last 168 hours No results found for this  basename: LIPASE, AMYLASE,  in the last 168 hours No results found for this basename: AMMONIA,  in the last 168 hours CBC:  Recent Labs Lab 10/03/14 1156 10/04/14 0646  WBC 12.5* 10.1  NEUTROABS 8.9*  --   HGB 12.0* 11.4*  HCT 36.4* 35.0*  MCV 79.8 82.0  PLT 257 268   Cardiac Enzymes:  Recent Labs Lab 10/03/14 2134 10/04/14 0315 10/04/14 0646  TROPONINI <0.30 <0.30  <0.30   BNP (last 3 results)  Recent Labs  02/14/14 1145 08/25/14 0430 10/03/14 1100  PROBNP 1322.0* 243.9* 222.4*   CBG:  Recent Labs Lab 10/04/14 1151  GLUCAP 100*    Recent Results (from the past 240 hour(s))  URINE CULTURE     Status: None   Collection Time    09/25/14  8:51 PM      Result Value Ref Range Status   Specimen Description URINE, RANDOM   Final   Special Requests NONE   Final   Culture  Setup Time     Final   Value: 09/25/2014 23:04     Performed at [facility]     Final   Value: NO GROWTH     Performed at [facility]   Culture     Final   Value: NO GROWTH     Performed at [facility]   Report Status 09/27/2014 FINAL   Final  CULTURE, BLOOD (ROUTINE X 2)     Status: None   Collection Time    10/03/14 11:00 AM      Result Value Ref Range Status   Specimen Description BLOOD RIGHT ANTECUBITAL   Final   Special Requests BOTTLES DRAWN AEROBIC AND ANAEROBIC 5CCS   Final   Culture  Setup Time     Final   Value: 10/03/2014 17:41     Performed at [facility]   Culture     Final   Value:        BLOOD CULTURE RECEIVED NO GROWTH TO DATE CULTURE WILL BE HELD FOR 5 DAYS BEFORE ISSUING A FINAL NEGATIVE REPORT     Performed at [facility]   Report Status PENDING   Incomplete  CULTURE, BLOOD (ROUTINE X 2)     Status: None   Collection Time    10/03/14 11:56 AM      Result Value Ref Range Status   Specimen Description BLOOD LEFT ANTECUBITAL   Final   Special Requests BOTTLES DRAWN AEROBIC AND ANAEROBIC 4CC   Final   Culture  Setup Time     Final   Value: 10/03/2014 17:42     Performed at [facility]   Culture     Final   Value:        BLOOD CULTURE RECEIVED NO GROWTH TO DATE CULTURE WILL BE HELD FOR 5 DAYS BEFORE ISSUING A FINAL NEGATIVE REPORT     Performed at [facility]   Report Status PENDING   Incomplete     Studies:  Recent x-ray studies have been reviewed in detail by  the Attending Physician  Scheduled Meds:  Scheduled Meds: . aspirin  325 mg Oral Daily  . chlorhexidine  15 mL Mouth Rinse BID  . diltiazem  120 mg Oral Daily  . feeding supplement (PRO-STAT SUGAR FREE 64)  30 mL Oral BID  . folic acid  1 mg Oral Daily  . heparin  5,000 Units Subcutaneous 3 times per day  .  metoprolol tartrate  25 mg Oral BID  . piperacillin-tazobactam (ZOSYN)  IV  3.375 g Intravenous 3 times per day  . senna  1 tablet Oral QHS  . sodium chloride  3 mL Intravenous Q12H  . thiamine  100 mg Oral Daily    Time spent on care of this patient: 40 mins   Allie Bossier , MD   [facility] Office  [phone] Pager - [phone]  On-Call/Text Page:      [email or web address]      password [email or web address]  If 7PM-7AM, please contact night-coverage [email or web address] Password [email or web address] 10/04/2014, 3:27 PM   LOS: 1 day

## 2014-10-04 NOTE — ED Notes (Signed)
PK, Rn at [facility] that pt takes pills with apple sauce and pills cut in half. Informed that pt will be admitted as well.

## 2014-10-04 NOTE — Plan of Care (Signed)
Problem: Consults Goal: Skin Care Protocol Initiated - if Braden Score 18 or less If consults are not indicated, leave blank or document N/A Outcome: Not Progressing Wounds to bilat elbows and sacrum noted on admission Goal: Tobacco Cessation referral if indicated Outcome: Progressing SR w/occ  freq PAC's

## 2014-10-04 NOTE — ED Notes (Signed)
Offered pt food. He stated that he would rather sleep right now. I will come back later to see if he would like me to assist him with his meal.

## 2014-10-04 NOTE — ED Notes (Signed)
Meal tray ordered 

## 2014-10-04 NOTE — Progress Notes (Addendum)
Patient ID: Anthony Mcclure, male   DOB: 11-15-1951, 63 y.o.   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    HISTORY OF PRESENT ILLNESS:  After seeing this gentleman three days ago for tachypnea and diaphoresis at around noon, I was once again called at 5:30 to a report that the patient was hypotensive at 80 systolic and unresponsive.  We sent him to the emergency room on that basis over the phone.  His basic metabolic panel in the ER was normal.  White count was 11.5, hemoglobin 13, platelets 188.  Differential count was normal.  Urinalysis was unremarkable.    In discussing things with Physical Therapy, apparently it is known in the facility that he is posturally hypotensive.  This is from largely sitting up in the bed.   They also have gotten him up into a Geri-chair but have found his blood pressure to be too unstable.  He is also noted to have episodes of tachycardia associated with extreme diaphoresis.    In reviewing his chart, his initial cardiac arrest was pulseless electrical activity.   During that hospitalization, he had episodes of atrial flutter.  He was discharged on metoprolol, I think mostly for hypertension if I am reading the notes correctly.    CURRENT MEDICATIONS:  Medication list is reviewed.     Metoprolol 25 b.i.d.    Xanax 1 mg three times a day when necessary.    ASA 325.    PHYSICAL EXAMINATION:   VITAL SIGNS:   BLOOD PRESSURE/PULSE:  His supine blood pressure is 148/80.  Pulse is 70.  Sitting him up at roughly 80, drops to 80 systolic.  There is no postural tachycardia here.   GENERAL APPEARANCE:  The patient appears to be at his baseline.  He can answer simple questions.  I find him somewhat difficult to  understand at some points.   CHEST/RESPIRATORY:  Clear air entry bilaterally.   CARDIOVASCULAR:  CARDIAC:   Heart sounds are normal.  He appears to be euvolemic.   GASTROINTESTINAL:  LIVER/SPLEEN/KIDNEYS:  No liver, no spleen.  No tenderness.   GENITOURINARY:  BLADDER:   Foley catheter still in place.   CIRCULATION:  EDEMA/VARICOSITIES:  Extremities:  No evidence of a DVT.    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  I think this includes postural hypotension, episodes of tachypnea, tachycardia, and diaphoresis.  At some points, he has been bradycardic.  I am going to reduce his metoprolol to 12.5 b.i.d.  I am of the thought that he may require midodrine to support his blood pressure.

## 2014-10-04 NOTE — ED Notes (Signed)
This RN called Dr. Sherral Hammers and updated him on pts status and that he is now in NSR. EKG completed and exported.

## 2014-10-04 NOTE — Progress Notes (Signed)
Patient Name: Anthony Mcclure Date of Encounter: 10/04/2014     Active Problems:   Alcohol abuse   Paroxysmal Atrial Flutter   Hepatitis C   HTN (hypertension)   Noncompliance with medication treatment due to intermittent use of medication   PEA (Pulseless electrical activity)   Quadriplegia   Protein calorie malnutrition   Constipation   Acute dyspnea   Palpitation    SUBJECTIVE  Denies any CP, some SOB. Does not feel any palpitation   CURRENT MEDS . aspirin  325 mg Oral Daily  . chlorhexidine  15 mL Mouth Rinse BID  . feeding supplement (PRO-STAT SUGAR FREE 64)  30 mL Oral BID  . folic acid  1 mg Oral Daily  . heparin  5,000 Units Subcutaneous 3 times per day  . metoprolol tartrate  25 mg Oral BID  . piperacillin-tazobactam (ZOSYN)  IV  3.375 g Intravenous 3 times per day  . senna  1 tablet Oral QHS  . sodium chloride  3 mL Intravenous Q12H  . thiamine  100 mg Oral Daily    OBJECTIVE  Filed Vitals:   10/04/14 1230 10/04/14 1300 10/04/14 1315 10/04/14 1330  BP:  122/77 123/80 123/77  Pulse:  70 71 71  Temp: 98 F (36.7 C)     TempSrc: Oral     Resp:  11 14 22   SpO2:  100% 100% 100%    Intake/Output Summary (Last 24 hours) at 10/04/14 1418 Last data filed at 10/04/14 0251  Gross per 24 hour  Intake      0 ml  Output   1000 ml  Net  -1000 ml   There were no vitals filed for this visit.  PHYSICAL EXAM  General: Pleasant, NAD. Neuro: Alert and oriented X 3. Quadreplegic Psych: Normal affect. HEENT:  Normal  Neck: Supple without bruits or JVD. Lungs:  Resp regular and unlabored, anterior exam CTA. Heart: RRR no s3, s4, or murmurs. Abdomen: Soft, non-tender, non-distended, BS + x 4.  Extremities: No clubbing, cyanosis or edema. DP/PT/Radials 2+ and equal bilaterally.  Accessory Clinical Findings  CBC  Recent Labs  10/03/14 1156 10/04/14 0646  WBC 12.5* 10.1  NEUTROABS 8.9*  --   HGB 12.0* 11.4*  HCT 36.4* 35.0*  MCV 79.8 82.0  PLT 257 093    Basic Metabolic Panel  Recent Labs  10/03/14 1100 10/04/14 0646  NA 140 137  K 4.7 4.2  CL 102 101  CO2 27 25  GLUCOSE 101* 90  BUN 13 10  CREATININE 0.54 0.58  CALCIUM 9.0 8.8   Cardiac Enzymes  Recent Labs  10/03/14 2134 10/04/14 0315 10/04/14 0646  TROPONINI <0.30 <0.30 <0.30   Thyroid Function Tests  Recent Labs  10/03/14 2140  TSH 1.430    ECG  Sinus tachycardia with frequent PACs  Echocardiogram 08/17/2014  LV EF: 50% - 55%  ------------------------------------------------------------------- Indications: Cardiac arrest 427.5.  ------------------------------------------------------------------- History: Risk factors: Alcohol Abuse. Rhabdomyolysis. Hypertension.  ------------------------------------------------------------------- Study Conclusions  - Left ventricle: Inferobasal hypokinesis. The cavity size was mildly dilated. Wall thickness was normal. Systolic function was normal. The estimated ejection fraction was in the range of 50% to 55%. - Aorta: Moderate dilatation of the sinuses and ascending root No signs of discection suggest CT correlation. - Atrial septum: No defect or patent foramen ovale was identified.      Radiology/Studies  Ct Angio Chest Pe W/cm &/or Wo Cm  10/03/2014   CLINICAL DATA:  Chest pain and shortness of breath;  history of hypertension, congestive heart failure, and cardiac dysrhythmia. Also history of alcohol abuse, hepatitis-C and previous episodes of respiratory failure  EXAM: CT ANGIOGRAPHY CHEST WITH CONTRAST  TECHNIQUE: Multidetector CT imaging of the chest was performed using the standard protocol during bolus administration of intravenous contrast. Multiplanar CT image reconstructions and MIPs were obtained to evaluate the vascular anatomy.  CONTRAST:  165mL OMNIPAQUE IOHEXOL 350 MG/ML SOLN  COMPARISON:  Portable chest x-ray of today's date.  FINDINGS: Contrast within the pulmonary arterial tree is normal.  There are no filling defects to suggest an acute pulmonary embolism. The caliber of the thoracic aorta is normal. The cardiac chambers are mildly enlarged. There is no pericardial effusion. There are coronary artery calcifications. There is no mediastinal nor hilar lymphadenopathy. No acute esophageal abnormality is demonstrated.  There is a mild atelectatic change posteriorly in the right lower lobe. There is no alveolar pneumonia. There is no pleural effusion or pneumothorax.  There is calcification of the anterior longitudinal ligament of the thoracic spine. The thoracic vertebral bodies are preserved in height. The sternum is intact. There are healing fractures of the second through fifth anterior ribs on the left. There are also healing fractures of the anterior lateral aspects of the second through sixth as well as the eighth ribs on the right.  Within the upper abdomen the observed portions of the liver and spleen are normal.  Review of the MIP images confirms the above findings.  IMPRESSION: 1. There is no acute pulmonary embolism nor acute thoracic aortic pathology. 2. There is minimal atelectasis in the right lower lobe but no evidence of pneumonia nor other acute pulmonary abnormality. 3. There are multiple healing rib fractures bilaterally. There is no evidence of a pneumothorax or pleural effusion.   Electronically Signed   By: David  Martinique   On: 10/03/2014 16:40   Dg Chest Port 1 View  10/03/2014   CLINICAL DATA:  Chest pain, shortness of breath, sepsis ; history of CHF and hepatitis  EXAM: PORTABLE CHEST - 1 VIEW  COMPARISON:  Portable chest x-ray of September 25, 2014  FINDINGS: The lungs are adequately inflated and clear. The heart and pulmonary vascularity are within the limits of normal. The mediastinum is normal in width. There is no pleural effusion. The bony thorax is unremarkable.  IMPRESSION: There is no acute cardiopulmonary abnormality.   Electronically Signed   By: David  Martinique   On:  10/03/2014 11:43   Dg Chest Port 1 View  09/25/2014   CLINICAL DATA:  Altered mental status. History of hypertension, CHF and alcohol abuse. Initial encounter.  EXAM: PORTABLE CHEST - 1 VIEW  COMPARISON:  08/28/2014; 08/25/2014  FINDINGS: Grossly unchanged borderline enlarged cardiac silhouette and mediastinal contours. No focal parenchymal opacities. No pleural effusion or pneumothorax. There is persistent mild elevation/ eventration of the right hemidiaphragm. No pleural effusion. No evidence of edema. Unchanged bones.  IMPRESSION: No acute cardiopulmonary disease.   Electronically Signed   By: Sandi Mariscal M.D.   On: 09/25/2014 20:54    ASSESSMENT AND PLAN  1. Tachycardia, ST at times possible PAT  - resolved, HR stable, currently on 5 mg dilt, will convert to 120mg  long acting diltiazem. Take along with home metoprolol 25mg  BID  2. Leukocytosis, does have foley cath and has ulcers on elbows and sacrum.  3. HTN stable  4. Spinal cord infarct with quadriplegia- at time of arrest  5. Decubitus  6. Hx of seizures    Signed, Eulas Post,  Ayesha Mohair Pager: [phone]   Patient seen and examined. Agree with assessment and plan. Tachycardia improved; now NSR at 81. BP ~  354 systolic.  Will plan to initiate oral diltiazem and later dc drip.   Troy Sine, MD, [facility] 10/04/2014 3:12 PM

## 2014-10-04 NOTE — Progress Notes (Signed)
Utilization Review Completed.Anthony Mcclure T10/22/2015  

## 2014-10-05 DIAGNOSIS — R1310 Dysphagia, unspecified: Secondary | ICD-10-CM

## 2014-10-05 DIAGNOSIS — I480 Paroxysmal atrial fibrillation: Secondary | ICD-10-CM | POA: Diagnosis not present

## 2014-10-05 LAB — CBC WITH DIFFERENTIAL/PLATELET
BASOS PCT: 0 % (ref 0–1)
Basophils Absolute: 0 10*3/uL (ref 0.0–0.1)
EOS PCT: 2 % (ref 0–5)
Eosinophils Absolute: 0.2 10*3/uL (ref 0.0–0.7)
HEMATOCRIT: 36.5 % — AB (ref 39.0–52.0)
Hemoglobin: 12.2 g/dL — ABNORMAL LOW (ref 13.0–17.0)
Lymphocytes Relative: 19 % (ref 12–46)
Lymphs Abs: 1.7 10*3/uL (ref 0.7–4.0)
MCH: 26.2 pg (ref 26.0–34.0)
MCHC: 33.4 g/dL (ref 30.0–36.0)
MCV: 78.3 fL (ref 78.0–100.0)
MONO ABS: 0.9 10*3/uL (ref 0.1–1.0)
Monocytes Relative: 10 % (ref 3–12)
NEUTROS ABS: 6 10*3/uL (ref 1.7–7.7)
Neutrophils Relative %: 69 % (ref 43–77)
Platelets: 276 10*3/uL (ref 150–400)
RBC: 4.66 MIL/uL (ref 4.22–5.81)
RDW: 15.8 % — AB (ref 11.5–15.5)
WBC: 8.7 10*3/uL (ref 4.0–10.5)

## 2014-10-05 LAB — COMPREHENSIVE METABOLIC PANEL
ALBUMIN: 1.9 g/dL — AB (ref 3.5–5.2)
ALT: 47 U/L (ref 0–53)
AST: 61 U/L — ABNORMAL HIGH (ref 0–37)
Alkaline Phosphatase: 65 U/L (ref 39–117)
Anion gap: 12 (ref 5–15)
BUN: 9 mg/dL (ref 6–23)
CO2: 23 mEq/L (ref 19–32)
CREATININE: 0.53 mg/dL (ref 0.50–1.35)
Calcium: 8.8 mg/dL (ref 8.4–10.5)
Chloride: 98 mEq/L (ref 96–112)
GFR calc Af Amer: >90 mL/min (ref >90)
GFR calc non Af Amer: >90 mL/min (ref >90)
Glucose, Bld: 101 mg/dL — ABNORMAL HIGH (ref 70–99)
Potassium: 4.3 mEq/L (ref 3.7–5.3)
Sodium: 133 mEq/L — ABNORMAL LOW (ref 137–147)
Total Bilirubin: 0.7 mg/dL (ref 0.3–1.2)
Total Protein: 6.4 g/dL (ref 6.0–8.3)

## 2014-10-05 LAB — MAGNESIUM: MAGNESIUM: 1.6 mg/dL (ref 1.5–2.5)

## 2014-10-05 MED ORDER — ALPRAZOLAM 1 MG PO TABS: 1.0000 mg | ORAL_TABLET | Freq: Three times a day (TID) | ORAL | Status: AC | PRN

## 2014-10-05 MED ORDER — DILTIAZEM HCL ER COATED BEADS 120 MG PO CP24: 120.0000 mg | ORAL_CAPSULE | Freq: Every day | ORAL | Status: AC

## 2014-10-05 MED ORDER — PRO-STAT SUGAR FREE PO LIQD: 30.0000 mL | Freq: Three times a day (TID) | ORAL | Status: DC

## 2014-10-05 MED ORDER — PRO-STAT SUGAR FREE PO LIQD
30.0000 mL | Freq: Three times a day (TID) | ORAL | Status: DC
  Filled 2014-10-05 (×2): qty 30

## 2014-10-05 NOTE — Progress Notes (Signed)
10/05/2014 Discharged to [facility] via EMS. Foley to remain.  IV's removed. Report called previously by [employer]  Tasmine Hipwell, Carolynn Comment

## 2014-10-05 NOTE — Evaluation (Signed)
Speech Language Pathology Evaluation Patient Details Name: Anthony Mcclure MRN: [id number] DOB: [birth year] Today's Date: 10/05/2014 Time: 6546-5035 SLP Time Calculation (min): 30 min  Problem List:  Patient Active Problem List   Diagnosis Date Noted  . Acute dyspnea 10/03/2014  . Palpitation 10/03/2014  . Other nonspecific abnormal serum enzyme levels 09/12/2014  . Constipation 09/03/2014  . Spinal cord infarction 08/24/2014  . Dehydration with hypernatremia 08/24/2014  . Dysphagia 08/24/2014  . Protein calorie malnutrition 08/24/2014  . Quadriplegia 08/19/2014  . Cardiac arrest 08/16/2014  . PEA (Pulseless electrical activity) 08/16/2014  . Acute respiratory failure with hypoxia 08/16/2014  . Alcohol dependence 07/20/2013  . Preventative health care 04/16/2012  . Alcohol withdrawal seizure 02/02/2012  . Hepatitis C 02/02/2012  . HTN (hypertension) 02/02/2012  . Noncompliance with medication treatment due to intermittent use of medication 02/02/2012  . Alcohol abuse 02/01/2012  . Paroxysmal Atrial Flutter 02/01/2012   Past Medical History:  Past Medical History  Diagnosis Date  . CHF (congestive heart failure)   . Alcohol abuse   . Seizure   . Noncompliance with medication treatment due to intermittent use of medication 02/02/2012  . HTN (hypertension) 02/02/2012  . Alcohol withdrawal seizure 02/02/2012  . Hypertension   . Dysrhythmia     paroxismal atrial fib  . Paroxysmal Atrial Flutter 02/01/2012  . Hepatitis C 02/02/2012    Unsure if he was ever diagnosed with Hep C  . Hepatitis C   . Quadriplegia    Past Surgical History:  Past Surgical History  Procedure Laterality Date  . Forearm reconstruction     HPI:  63 year old male admitted 10/03/14 due to tachycardia and SOB. 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 Comm/cog evaluation completed during last admission with documented deficits.    Assessment / Plan / Recommendation Clinical Impression  Pt appears to present at baseline, as indicated from August 29, 2014. Vocal function has improved, however, with aphonia resolved. Voice quality is clear but low in volume. Pt continues to exhibit severe cognitive and communicative impairments. Even given max cues, pt is unable to demonstrate accurate, reliable, intelligible responses consistently. Some utterances are understandable ("I want a bottle of wine"), but most are unintelligible, and pt is unable to utilize strategies to increase intelligibility (slow rate, overarticulation, etc). Pt is able to verbalize his name with repetition needed due to poor intelligibility, but further responses to orientation questions are unclear. Continue to recommend further ST intervention to facilitate basic communication of wants and needs.    SLP Assessment  All further Speech Lanaguage Pathology  needs can be addressed in the next venue of care    Follow Up Recommendations  Skilled Nursing facility;24 hour supervision/assistance    Frequency and Duration min 1 x/week      Pertinent Vitals/Pain Pain Assessment: Faces Faces Pain Scale: No hurt   SLP Goals  Progression toward goals: Progressing toward goals Patient/Family Stated Goal: to have a bottle of wine  SLP Evaluation Prior Functioning  Cognitive/Linguistic Baseline: Baseline deficits Type of Home: [place] Available Help at Discharge: [place] Education:  (unknown) Vocation: Unemployed   Cognition  Overall Cognitive Status: History of cognitive impairments - at baseline Arousal/Alertness: Awake/alert Orientation Level: Oriented to person Attention: Focused;Sustained Focused Attention: Appears intact Sustained Attention: Appears intact Memory:  (unable to assess) Awareness:  (unable to assess) Problem Solving:  (unable to assess) Executive Function:  (unable to assess)  Comprehension   Auditory Comprehension Overall Auditory Comprehension: Impaired at baseline Yes/No Questions: Impaired Commands: Impaired Visual Recognition/Discrimination Discrimination: Not tested Reading Comprehension Reading Status: Not tested    Expression Expression Primary Mode of Expression: Verbal Verbal Expression Overall Verbal Expression: Impaired at baseline Initiation: No impairment Automatic Speech: Counting;Social Response (1-10 without difficulty) Level of Generative/Spontaneous Verbalization: Phrase Repetition: Impaired Level of Impairment: Word level Naming: Impairment Confrontation: Impaired Interfering Components: Speech intelligibility;Premorbid deficit;Attention Written Expression Written Expression: Unable to assess (comment) (quadriplegia)   Oral / Motor Oral Motor/Sensory Function Overall Oral Motor/Sensory Function: Appears within functional limits for tasks assessed Motor Speech Overall Motor Speech: Impaired at baseline Respiration: Impaired Level of Impairment: Conversation Phonation: Low vocal intensity Resonance: Within functional limits Articulation: Impaired Level of Impairment: Word Intelligibility: Intelligibility reduced Word: 25-49% accurate Phrase: 0-24% accurate Sentence: 0-24% accurate Conversation: 0-24% accurate Motor Planning: Witnin functional limits Motor Speech Errors: Not applicable Interfering Components: Premorbid status   GO   Anthony Mcclure B. Quentin Ore [facility], CCC-SLP [phone]  Shonna Chock 10/05/2014, 2:17 PM

## 2014-10-05 NOTE — Evaluation (Signed)
Clinical/Bedside Swallow Evaluation Patient Details  Name: Anthony Mcclure MRN: [id number] Date of Birth: 07-May-1951  Today's Date: 10/05/2014 Time: 1030-1045 SLP Time Calculation (min): 15 min  Past Medical History:  Past Medical History  Diagnosis Date  . CHF (congestive heart failure)   . Alcohol abuse   . Seizure   . Noncompliance with medication treatment due to intermittent use of medication 02/02/2012  . HTN (hypertension) 02/02/2012  . Alcohol withdrawal seizure 02/02/2012  . Hypertension   . Dysrhythmia     paroxismal atrial fib  . Paroxysmal Atrial Flutter 02/01/2012  . Hepatitis C 02/02/2012    Unsure if he was ever diagnosed with Hep C  . Hepatitis C   . Quadriplegia    Past Surgical History:  Past Surgical History  Procedure Laterality Date  . Forearm reconstruction     HPI:  63 year old male admitted 10/03/14 due to tachycardia and SOB. 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 FEES completed during last admission (08/27/14), with recommendation for Dys 1 and Honey thick liquids.   Assessment / Plan / Recommendation Clinical Impression  Pt appears to have improved since FEES completed September 2015. Voice quality is clear, but low in volume. Nursing reported pt having difficulty with solid consistencies this morning, but appears to tolerate puree consistencies well, without significant delay or residue. No cough response noted on thin liquids. Per FEES, pt reflexive cough was strong and effective following airway compromise, so will modify diet to puree and continue to allow thin liquids. ST to follow for diet tolerance and appropriateness to advance solids.    Aspiration Risk  Moderate    Diet Recommendation Dysphagia 1 (Puree);Thin liquid   Liquid Administration via: Straw Medication Administration: Crushed with puree Supervision: Trained caregiver to feed patient Compensations: Slow  rate;Small sips/bites Postural Changes and/or Swallow Maneuvers: Seated upright 90 degrees;Upright 30-60 min after meal    Other  Recommendations Oral Care Recommendations: Oral care BID   Follow Up Recommendations  Skilled Nursing facility;24 hour supervision/assistance    Frequency and Duration min 1 x/week  1 week   Pertinent Vitals/Pain VSS, no pain indicated    SLP Swallow Goals  diet tolerance, appropriate advancement   Swallow Study Prior Functional Status   History of dysphagia with strong effective reflexive cough after airway compromise.    General Date of Onset: 10/03/14 HPI: 63 year old male admitted 10/03/14 due to tachycardia and SOB. 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 FEES completed during last admission (08/27/14), with recommendation for Dys 1 and Honey thick liquids. Type of Study: Bedside swallow evaluation Previous Swallow Assessment: FEES August 27, 2014 Diet Prior to this Study: Dysphagia 3 (soft);Thin liquids Temperature Spikes Noted: No Respiratory Status: Room air History of Recent Intubation: No Behavior/Cognition: Alert;Doesn't follow directions;Cooperative Oral Cavity - Dentition: Adequate natural dentition Self-Feeding Abilities: Total assist Patient Positioning: Upright in bed Baseline Vocal Quality: Clear;Other (comment) Volitional Cough: Cognitively unable to elicit Volitional Swallow: Unable to elicit    Oral/Motor/Sensory Function Overall Oral Motor/Sensory Function: Appears within functional limits for tasks assessed   Ice Chips Ice chips: Not tested   Thin Liquid Thin Liquid: Within functional limits Presentation: Straw    Nectar Thick Nectar Thick Liquid: Not tested   Honey Thick Honey Thick Liquid: Not tested   Puree Puree: Within functional limits Presentation: Glide  Renda Rolls, [facility], CCC-SLP [phone] Solid: Not  tested       Shonna Chock 10/05/2014,1:44 PM

## 2014-10-05 NOTE — Progress Notes (Signed)
INITIAL NUTRITION ASSESSMENT  DOCUMENTATION CODES Per approved criteria  -Not Applicable   INTERVENTION: 30 ml Prostat TID  NUTRITION DIAGNOSIS: Increased nutrient needs related to increased protein needs for wound healing as evidenced by pt with multiple pressure ulcers.   Goal: Pt will meet >90% of estimated nutritional needs  Monitor:  Diet tolerance, PO/supplement intake, labs, weight changes, I/O's  Reason for Assessment: MST=2  63 y.o. male  Admitting Dx: <principal problem not specified>  Anthony Mcclure is a 63 y.o. 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  ASSESSMENT: Pt is a resident of [facility] who was admitted from a-flutter and sinus tachycardia. He has hx of alcohol abuse and quadriplegia as a result fo spinal cord infarction. Pt drowsy at visit, but will respond to simple yes and no questions. He reports his appetite is improving. Documented PO intake reveals 40% meal completion.  Noted downgrade from Dysphagia 3to Dysphagia 1 diet. Speech consulted to evaluate for dysphagia.  Pt with multiple wounds- noted on [facility] that pt receives Prostat. Will order to maximize protein intake and for continuity of care.  Labs reviewed. Na: 133, Glucose: 101, CBGS: 100. Mg and K WDL.  Nutrition Focused Physical Exam:  Subcutaneous Fat:  Orbital Region: WDL Upper Arm Region: WDL Thoracic and Lumbar Region: WDL  Muscle:  Temple Region: mild depletion Clavicle Bone Region: mild  depletion Clavicle and Acromion Bone Region: mild depletion Scapular Bone Region: mild depletion Dorsal Hand: WDL Patellar Region: WDL Anterior Thigh Region: mild depletion Posterior Calf Region: WDL  Edema: none present  Height: Ht Readings from Last 1 Encounters:  10/04/14 6\' 2"  (1.88 m)    Weight: Wt Readings from Last 1 Encounters:  10/05/14 189 lb 9.5 oz (86 kg)    Ideal Body Weight: 171#  % Ideal Body Weight: 111%  Wt Readings from Last 10 Encounters:  10/05/14 189 lb 9.5 oz (86 kg)  09/25/14 200 lb (90.719 kg)  09/19/14 189 lb 11.2 oz (86.047 kg)  09/10/14 189 lb 11.2 oz (86.047 kg)  08/31/14 190 lb 9.6 oz (86.456 kg)  04/20/14 203 lb 12.8 oz (92.443 kg)  02/19/14 201 lb 3.2 oz (91.264 kg)  02/15/14 191 lb 12.8 oz (87 kg)  08/03/13 197 lb (89.359 kg)  07/20/13 190 lb (86.183 kg)    Usual Body Weight: 190#  % Usual Body Weight: 99%  BMI:  Body mass index is 24.33 kg/(m^2). Normal weight range  Estimated Nutritional Needs: Kcal: 2300-2500 Protein: 120-130 grams Fluid: 2.3-2.5 L  Skin: stage II pressure ulcer on rt elbow, stage III pressure ulcer on rt elbow x 2, stage III pressure ulcer on bilateral buttocks  Diet Order: Dysphagia 1  EDUCATION NEEDS: -Education not appropriate at this time   Intake/Output Summary (Last 24 hours) at 10/05/14 0917 Last data filed at 10/05/14 0600  Gross per 24 hour  Intake 813.67 ml  Output   1975 ml  Net -1161.33 ml    Last BM: 10/04/14  Labs:   Recent Labs  Lab 10/03/14 1100 10/04/14 0646 10/05/14 0630  NA 140 137 133*  K 4.7 4.2 4.3  CL 102 101 98  CO2 27 25 23   BUN 13 10 9   CREATININE 0.54 0.58 0.53  CALCIUM 9.0 8.8 8.8  MG  --   --  1.6  GLUCOSE 101* 90 101*    CBG (last 3)   Recent Labs  10/04/14 1151  GLUCAP 100*    Scheduled Meds: . aspirin  325 mg Oral Daily  . chlorhexidine  15 mL Mouth Rinse BID  . Chlorhexidine Gluconate Cloth  6 each Topical Q0600  . diltiazem  120 mg Oral  Daily  . folic acid  1 mg Oral Daily  . heparin  5,000 Units Subcutaneous 3 times per day  . Influenza vac split quadrivalent PF  0.5 mL Intramuscular Tomorrow-1000  . metoprolol tartrate  25 mg Oral BID  . mupirocin ointment  1 application Nasal BID  . senna  1 tablet Oral QHS  . sodium chloride  3 mL Intravenous Q12H  . thiamine  100 mg Oral Daily    Continuous Infusions: . sodium chloride Stopped (10/05/14 0522)    Past Medical History  Diagnosis Date  . CHF (congestive heart failure)   . Alcohol abuse   . Seizure   . Noncompliance with medication treatment due to intermittent use of medication 02/02/2012  . HTN (hypertension) 02/02/2012  . Alcohol withdrawal seizure 02/02/2012  . Hypertension   . Dysrhythmia     paroxismal atrial fib  . Paroxysmal Atrial Flutter 02/01/2012  . Hepatitis C 02/02/2012    Unsure if he was ever diagnosed with Hep C  . Hepatitis C   . Quadriplegia     Past Surgical History  Procedure Laterality Date  . Forearm reconstruction      Anthony Mcclure, RD, LDN Pager: [phone] After hours Pager: [phone]

## 2014-10-05 NOTE — Progress Notes (Signed)
Patient Name: Lysle Yero Date of Encounter: 10/05/2014     Active Problems:   Alcohol abuse   Paroxysmal Atrial Flutter   Hepatitis C   HTN (hypertension)   Noncompliance with medication treatment due to intermittent use of medication   PEA (Pulseless electrical activity)   Quadriplegia   Protein calorie malnutrition   Constipation   Acute dyspnea   Palpitation    SUBJECTIVE  Denies any CP, some SOB. Does not feel any palpitation   CURRENT MEDS . aspirin  325 mg Oral Daily  . chlorhexidine  15 mL Mouth Rinse BID  . Chlorhexidine Gluconate Cloth  6 each Topical Q0600  . diltiazem  120 mg Oral Daily  . folic acid  1 mg Oral Daily  . heparin  5,000 Units Subcutaneous 3 times per day  . metoprolol tartrate  25 mg Oral BID  . mupirocin ointment  1 application Nasal BID  . senna  1 tablet Oral QHS  . sodium chloride  3 mL Intravenous Q12H  . thiamine  100 mg Oral Daily    OBJECTIVE  Filed Vitals:   10/04/14 2142 10/05/14 0008 10/05/14 0324 10/05/14 0756  BP: 118/65 120/84 124/84   Pulse: 107 70 80 72  Temp:  97.7 F (36.5 C) 97.9 F (36.6 C) 98.3 F (36.8 C)  TempSrc:  Oral Oral Oral  Resp:  19 25 17   Height:      Weight:   189 lb 9.5 oz (86 kg)   SpO2:  99% 100% 100%    Intake/Output Summary (Last 24 hours) at 10/05/14 1057 Last data filed at 10/05/14 0600  Gross per 24 hour  Intake 813.67 ml  Output   1975 ml  Net -1161.33 ml   Filed Weights   10/04/14 1900 10/05/14 0324  Weight: 192 lb 7.4 oz (87.3 kg) 189 lb 9.5 oz (86 kg)    PHYSICAL EXAM  General: Pleasant, NAD. Neuro: Alert and oriented X 3. Quadreplegic Psych: Normal affect. HEENT:  Normal  Neck: Supple without bruits or JVD. Lungs:  Resp regular and unlabored, anterior exam CTA. Heart: RRR no s3, s4, or murmurs. Abdomen: Soft, non-tender, non-distended, BS + x 4.  Extremities: No clubbing, cyanosis or edema. DP/PT/Radials 2+ and equal bilaterally.  Accessory Clinical  Findings  CBC  Recent Labs  10/03/14 1156 10/04/14 0646 10/05/14 0630  WBC 12.5* 10.1 8.7  NEUTROABS 8.9*  --  6.0  HGB 12.0* 11.4* 12.2*  HCT 36.4* 35.0* 36.5*  MCV 79.8 82.0 78.3  PLT 257 268 086   Basic Metabolic Panel  Recent Labs  10/04/14 0646 10/05/14 0630  NA 137 133*  K 4.2 4.3  CL 101 98  CO2 25 23  GLUCOSE 90 101*  BUN 10 9  CREATININE 0.58 0.53  CALCIUM 8.8 8.8  MG  --  1.6   Cardiac Enzymes  Recent Labs  10/03/14 2134 10/04/14 0315 10/04/14 0646  TROPONINI <0.30 <0.30 <0.30   Thyroid Function Tests  Recent Labs  10/03/14 2140  TSH 1.430    ECG  Sinus tachycardia with frequent PACs  Echocardiogram 08/17/2014  LV EF: 50% - 55%  ------------------------------------------------------------------- Indications: Cardiac arrest 427.5.  ------------------------------------------------------------------- History: Risk factors: Alcohol Abuse. Rhabdomyolysis. Hypertension.  ------------------------------------------------------------------- Study Conclusions  - Left ventricle: Inferobasal hypokinesis. The cavity size was mildly dilated. Wall thickness was normal. Systolic function was normal. The estimated ejection fraction was in the range of 50% to 55%. - Aorta: Moderate dilatation of the sinuses and ascending  root No signs of discection suggest CT correlation. - Atrial septum: No defect or patent foramen ovale was identified.      Radiology/Studies  Ct Angio Chest Pe W/cm &/or Wo Cm  10/03/2014   CLINICAL DATA:  Chest pain and shortness of breath; history of hypertension, congestive heart failure, and cardiac dysrhythmia. Also history of alcohol abuse, hepatitis-C and previous episodes of respiratory failure  EXAM: CT ANGIOGRAPHY CHEST WITH CONTRAST  TECHNIQUE: Multidetector CT imaging of the chest was performed using the standard protocol during bolus administration of intravenous contrast. Multiplanar CT image reconstructions and  MIPs were obtained to evaluate the vascular anatomy.  CONTRAST:  149mL OMNIPAQUE IOHEXOL 350 MG/ML SOLN  COMPARISON:  Portable chest x-ray of today's date.  FINDINGS: Contrast within the pulmonary arterial tree is normal. There are no filling defects to suggest an acute pulmonary embolism. The caliber of the thoracic aorta is normal. The cardiac chambers are mildly enlarged. There is no pericardial effusion. There are coronary artery calcifications. There is no mediastinal nor hilar lymphadenopathy. No acute esophageal abnormality is demonstrated.  There is a mild atelectatic change posteriorly in the right lower lobe. There is no alveolar pneumonia. There is no pleural effusion or pneumothorax.  There is calcification of the anterior longitudinal ligament of the thoracic spine. The thoracic vertebral bodies are preserved in height. The sternum is intact. There are healing fractures of the second through fifth anterior ribs on the left. There are also healing fractures of the anterior lateral aspects of the second through sixth as well as the eighth ribs on the right.  Within the upper abdomen the observed portions of the liver and spleen are normal.  Review of the MIP images confirms the above findings.  IMPRESSION: 1. There is no acute pulmonary embolism nor acute thoracic aortic pathology. 2. There is minimal atelectasis in the right lower lobe but no evidence of pneumonia nor other acute pulmonary abnormality. 3. There are multiple healing rib fractures bilaterally. There is no evidence of a pneumothorax or pleural effusion.   Electronically Signed   By: David  Martinique   On: 10/03/2014 16:40   Dg Chest Port 1 View  10/03/2014   CLINICAL DATA:  Chest pain, shortness of breath, sepsis ; history of CHF and hepatitis  EXAM: PORTABLE CHEST - 1 VIEW  COMPARISON:  Portable chest x-ray of September 25, 2014  FINDINGS: The lungs are adequately inflated and clear. The heart and pulmonary vascularity are within the limits  of normal. The mediastinum is normal in width. There is no pleural effusion. The bony thorax is unremarkable.  IMPRESSION: There is no acute cardiopulmonary abnormality.   Electronically Signed   By: David  [place]   On: 10/03/2014 11:43   Dg Chest Port 1 View  09/25/2014   CLINICAL DATA:  Altered mental status. History of hypertension, CHF and alcohol abuse. Initial encounter.  EXAM: PORTABLE CHEST - 1 VIEW  COMPARISON:  08/28/2014; 08/25/2014  FINDINGS: Grossly unchanged borderline enlarged cardiac silhouette and mediastinal contours. No focal parenchymal opacities. No pleural effusion or pneumothorax. There is persistent mild elevation/ eventration of the right hemidiaphragm. No pleural effusion. No evidence of edema. Unchanged bones.  IMPRESSION: No acute cardiopulmonary disease.   Electronically Signed   By: Sandi Mariscal M.D.   On: 09/25/2014 20:54    ASSESSMENT AND PLAN  1. Tachycardia, ST at times possible PAT  - resolved, HR stable, on Dilt CD 120 a day. Take along with home metoprolol 25mg  BID Titrate  as needed.  2. Leukocytosis, does have foley cath and has ulcers on elbows and sacrum.  3. HTN stable  4. Spinal cord infarct with quadriplegia- at time of arrest  5. Decubitus  6. Hx of seizures  No new recs.    [name], Brooke Bonito., MD, [facility] 10/05/2014, 10:59 AM [address]  [place] Pager [phone]

## 2014-10-05 NOTE — Progress Notes (Signed)
CSW (Clinical Education officer, museum) spoke with [facility] and they confirmed pt is a long term resident at their facility. Facility provided CSW with contact information for Scarlett Presto pt cousin and HPOA (phone number [phone]). CSW left voicemail for [facility]. Awaiting return phone call to confirm pt dc plan.  [place], [place]

## 2014-10-05 NOTE — Discharge Summary (Signed)
Physician Discharge Summary  Anthony Mcclure LOV:[id number] DOB: [birth year] DOA: 10/03/2014  PCP: [name], MD  Admit date: 10/03/2014 Discharge date: 10/05/2014  Time spent: 35 minutes  Recommendations for Outpatient Follow-up:  1. Please follow up on heart rates and blood pressures, he was discharged on Cardizem and Metoprolol   Discharge Diagnoses:  Active Problems:   Alcohol abuse   Paroxysmal Atrial Flutter   Hepatitis C   HTN (hypertension)   Noncompliance with medication treatment due to intermittent use of medication   PEA (Pulseless electrical activity)   Quadriplegia   Protein calorie malnutrition   Constipation   Acute dyspnea   Palpitation   Discharge Condition: Stable  Diet recommendation: Dysphagia 1 diet with thin liquids  Filed Weights   10/04/14 1900 10/05/14 0324  Weight: 87.3 kg (192 lb 7.4 oz) 86 kg (189 lb 9.5 oz)    History of present illness:  Anthony Mcclure is a 63 y.o. 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 CT angiogram of the chest was done which was nonacute. His lab work showed mild leukocytosis without any other problems, I was called to admit the patient for atrial flutter/sinus tachycardia.   Hospital Course:  Patient is a pleasant 63 year old gentleman with a past medical history of PE arrest, spinal cord infarction causing quadriplegia, history of alcohol abuse he was admitted to the medicine service on 10/03/2014. He presented as a transfer  from his skilled nursing facility where he was found to be tachycardic and [place] of breath. Initial workup included a chest x-ray performed on 10/03/2014 that showed no acute cardiopulmonary disease. He was admitted to the step down unit and started on a Cardizem drip. Patient was seen and evaluated by cardiology, converted to Cardizem 120 mg by mouth daily on 10/02/2014. Patient remained stable on metoprolol and Cardizem. Speech pathology was consulted, who recommended pured diet with thin liquids. By 10/05/2014 he remained stable, in sinus rhythm with heart rates in the 80s to 90s. He was discharged back to a skilled nursing facility in stable condition on this 10/05/2014.    Consultations:  Cardiology  Discharge Exam: Filed Vitals:   10/05/14 1139  BP: 138/63  Pulse: 79  Temp: 98.1 F (36.7 C)  Resp: 18    General: A./O. x2 (did not know when, why), NAD No acute respiratory distress  Lungs: Clear to auscultation bilaterally without wheezes or crackles  Cardiovascular: Irregular irregular rhythm and rate, without murmur gallop or rub normal S1 and S2  Abdomen: Nontender, nondistended, soft, bowel sounds positive, no rebound, no ascites, no appreciable mass  Extremities: No significant cyanosis, clubbing, or edema bilateral lower extremities   Discharge Instructions You were cared for by a hospitalist during your hospital stay. If you have any questions about your discharge medications or the care you received while you were in the hospital after you are discharged, you can call the unit and asked to speak with the hospitalist on call if the hospitalist that took care of you is not available. Once  you are discharged, your primary care physician will handle any further medical issues. Please note that NO REFILLS for any discharge medications will be authorized once you are discharged, as it is imperative that you return to your primary care physician (or establish a relationship with a  primary care physician if you do not have one) for your aftercare needs so that they can reassess your need for medications and monitor your lab values.  Discharge Instructions   Call MD for:  difficulty breathing, headache or visual disturbances    Complete by:  As directed      Call MD for:  difficulty breathing, headache or visual disturbances    Complete by:  As directed      Call MD for:  extreme fatigue    Complete by:  As directed      Call MD for:  extreme fatigue    Complete by:  As directed      Call MD for:  hives    Complete by:  As directed      Call MD for:  hives    Complete by:  As directed      Call MD for:  persistant dizziness or light-headedness    Complete by:  As directed      Call MD for:  persistant dizziness or light-headedness    Complete by:  As directed      Call MD for:  persistant nausea and vomiting    Complete by:  As directed      Call MD for:  persistant nausea and vomiting    Complete by:  As directed      Call MD for:  redness, tenderness, or signs of infection (pain, swelling, redness, odor or green/yellow discharge around incision site)    Complete by:  As directed      Call MD for:  redness, tenderness, or signs of infection (pain, swelling, redness, odor or green/yellow discharge around incision site)    Complete by:  As directed      Call MD for:  severe uncontrolled pain    Complete by:  As directed      Call MD for:  severe uncontrolled pain    Complete by:  As directed      Call MD for:  temperature >100.4    Complete by:  As directed      Call MD for:  temperature >100.4    Complete by:  As directed      Diet - low sodium heart healthy    Complete by:  As directed      Diet - low sodium heart healthy    Complete by:  As directed      Increase activity slowly    Complete by:  As directed      Increase activity slowly    Complete by:  As directed           Current Discharge Medication List    START taking these medications    Details  !! Amino Acids-Protein Hydrolys (FEEDING SUPPLEMENT, PRO-STAT SUGAR FREE 64,) LIQD Take 30 mLs by mouth 3 (three) times daily with meals. Qty: 900 mL, Refills: 0    diltiazem (CARDIZEM CD) 120 MG 24 hr capsule Take 1 capsule (120 mg total) by mouth daily. Qty: 30 capsule, Refills: 1     !! - Potential duplicate medications found. Please discuss with provider.    CONTINUE these medications which have CHANGED   Details  ALPRAZolam Duanne Moron)  1 MG tablet Take 1 tablet (1 mg total) by mouth 3 (three) times daily as needed for anxiety or sleep (agitation). Qty: 15 tablet, Refills: 0      CONTINUE these medications which have NOT CHANGED   Details  acetaminophen (TYLENOL) 325 MG tablet Take 2 tablets (650 mg total) by mouth every 6 (six) hours as needed for fever. Qty: 30 tablet, Refills: 0    !! Amino Acids-Protein Hydrolys (FEEDING SUPPLEMENT, PRO-STAT SUGAR FREE 64,) LIQD Take 30 mLs by mouth 2 (two) times daily.    aspirin 325 MG tablet Take 1 tablet (325 mg total) by mouth daily. Qty: 30 tablet, Refills: 0    chlorhexidine (PERIDEX) 0.12 % solution 15 mLs by Mouth Rinse route 2 (two) times daily. Qty: 120 mL, Refills: 0    folic acid (FOLVITE) 1 MG tablet Take 1 mg by mouth daily.    metoprolol tartrate (LOPRESSOR) 25 MG tablet Take 25 mg by mouth 2 (two) times daily.    Multiple Vitamins-Minerals (MULTIVITAMIN WITH MINERALS) tablet Take 1 tablet by mouth daily.    senna (SENOKOT) 8.6 MG TABS tablet Take 1 tablet (8.6 mg total) by mouth at bedtime. Qty: 120 each, Refills: 0    thiamine (VITAMIN B-1) 100 MG tablet Take 100 mg by mouth daily.     !! - Potential duplicate medications found. Please discuss with provider.     No Known Allergies Follow-up Information   Follow up with [name], MD In 1 week.   Specialty:  Internal Medicine   Contact information:   [place] [id number] [phone]        The results of significant  diagnostics from this hospitalization (including imaging, microbiology, ancillary and laboratory) are listed below for reference.    Significant Diagnostic Studies: Ct Angio Chest Pe W/cm &/or Wo Cm  10/03/2014   CLINICAL DATA:  Chest pain and shortness of breath; history of hypertension, congestive heart failure, and cardiac dysrhythmia. Also history of alcohol abuse, hepatitis-C and previous episodes of respiratory failure  EXAM: CT ANGIOGRAPHY CHEST WITH CONTRAST  TECHNIQUE: Multidetector CT imaging of the chest was performed using the standard protocol during bolus administration of intravenous contrast. Multiplanar CT image reconstructions and MIPs were obtained to evaluate the vascular anatomy.  CONTRAST:  152mL OMNIPAQUE IOHEXOL 350 MG/ML SOLN  COMPARISON:  Portable chest x-ray of today's date.  FINDINGS: Contrast within the pulmonary arterial tree is normal. There are no filling defects to suggest an acute pulmonary embolism. The caliber of the thoracic aorta is normal. The cardiac chambers are mildly enlarged. There is no pericardial effusion. There are coronary artery calcifications. There is no mediastinal nor hilar lymphadenopathy. No acute esophageal abnormality is demonstrated.  There is a mild atelectatic change posteriorly in the right lower lobe. There is no alveolar pneumonia. There is no pleural effusion or pneumothorax.  There is calcification of the anterior longitudinal ligament of the thoracic spine. The thoracic vertebral bodies are preserved in height. The sternum is intact. There are healing fractures of the second through fifth anterior ribs on the left. There are also healing fractures of the anterior lateral aspects of the second through sixth as well as the eighth ribs on the right.  Within the upper abdomen the observed portions of the liver and spleen are normal.  Review of the MIP images confirms the above findings.  IMPRESSION: 1. There is no acute pulmonary embolism nor acute  thoracic aortic pathology. 2. There is minimal  atelectasis in the right lower lobe but no evidence of pneumonia nor other acute pulmonary abnormality. 3. There are multiple healing rib fractures bilaterally. There is no evidence of a pneumothorax or pleural effusion.   Electronically Signed   By: David  Martinique   On: 10/03/2014 16:40   Dg Chest Port 1 View  10/03/2014   CLINICAL DATA:  Chest pain, shortness of breath, sepsis ; history of CHF and hepatitis  EXAM: PORTABLE CHEST - 1 VIEW  COMPARISON:  Portable chest x-ray of September 25, 2014  FINDINGS: The lungs are adequately inflated and clear. The heart and pulmonary vascularity are within the limits of normal. The mediastinum is normal in width. There is no pleural effusion. The bony thorax is unremarkable.  IMPRESSION: There is no acute cardiopulmonary abnormality.   Electronically Signed   By: David  Martinique   On: 10/03/2014 11:43   Dg Chest Port 1 View  09/25/2014   CLINICAL DATA:  Altered mental status. History of hypertension, CHF and alcohol abuse. Initial encounter.  EXAM: PORTABLE CHEST - 1 VIEW  COMPARISON:  08/28/2014; 08/25/2014  FINDINGS: Grossly unchanged borderline enlarged cardiac silhouette and mediastinal contours. No focal parenchymal opacities. No pleural effusion or pneumothorax. There is persistent mild elevation/ eventration of the right hemidiaphragm. No pleural effusion. No evidence of edema. Unchanged bones.  IMPRESSION: No acute cardiopulmonary disease.   Electronically Signed   By: Sandi Mariscal M.D.   On: 09/25/2014 20:54    Microbiology: Recent Results (from the past 240 hour(s))  URINE CULTURE     Status: None   Collection Time    09/25/14  8:51 PM      Result Value Ref Range Status   Specimen Description URINE, RANDOM   Final   Special Requests NONE   Final   Culture  Setup Time     Final   Value: 09/25/2014 23:04     Performed at [facility]     Final   Value: NO GROWTH     Performed at  [facility]   Culture     Final   Value: NO GROWTH     Performed at [facility]   Report Status 09/27/2014 FINAL   Final  CULTURE, BLOOD (ROUTINE X 2)     Status: None   Collection Time    10/03/14 11:00 AM      Result Value Ref Range Status   Specimen Description BLOOD RIGHT ANTECUBITAL   Final   Special Requests BOTTLES DRAWN AEROBIC AND ANAEROBIC 5CCS   Final   Culture  Setup Time     Final   Value: 10/03/2014 17:41     Performed at [facility]   Culture     Final   Value:        BLOOD CULTURE RECEIVED NO GROWTH TO DATE CULTURE WILL BE HELD FOR 5 DAYS BEFORE ISSUING A FINAL NEGATIVE REPORT     Performed at [facility]   Report Status PENDING   Incomplete  CULTURE, BLOOD (ROUTINE X 2)     Status: None   Collection Time    10/03/14 11:56 AM      Result Value Ref Range Status   Specimen Description BLOOD LEFT ANTECUBITAL   Final   Special Requests BOTTLES DRAWN AEROBIC AND ANAEROBIC 4CC   Final   Culture  Setup Time     Final   Value: 10/03/2014 17:42     Performed at  [facility]     Final   Value:        BLOOD CULTURE RECEIVED NO GROWTH TO DATE CULTURE WILL BE HELD FOR 5 DAYS BEFORE ISSUING A FINAL NEGATIVE REPORT     Performed at [facility]   Report Status PENDING   Incomplete  MRSA PCR SCREENING     Status: Abnormal   Collection Time    10/04/14  2:28 PM      Result Value Ref Range Status   MRSA by PCR POSITIVE (*) NEGATIVE Final   Comment:            The GeneXpert MRSA Assay (FDA     approved for NASAL specimens     only), is one component of a     comprehensive MRSA colonization     surveillance program. It is not     intended to diagnose MRSA     infection nor to guide or     monitor treatment for     MRSA infections.     RESULT CALLED TO, READ BACK BY AND VERIFIED WITH:     G. ROGERS RN 16:30 10/04/14 ([email or web address])     Labs: Basic Metabolic Panel:  Recent Labs Lab 10/03/14 1100 10/04/14 0646  10/05/14 0630  NA 140 137 133*  K 4.7 4.2 4.3  CL 102 101 98  CO2 27 25 23   GLUCOSE 101* 90 101*  BUN 13 10 9   CREATININE 0.54 0.58 0.53  CALCIUM 9.0 8.8 8.8  MG  --   --  1.6   Liver Function Tests:  Recent Labs Lab 10/05/14 0630  AST 61*  ALT 47  ALKPHOS 65  BILITOT 0.7  PROT 6.4  ALBUMIN 1.9*   No results found for this basename: LIPASE, AMYLASE,  in the last 168 hours No results found for this basename: AMMONIA,  in the last 168 hours CBC:  Recent Labs Lab 10/03/14 1156 10/04/14 0646 10/05/14 0630  WBC 12.5* 10.1 8.7  NEUTROABS 8.9*  --  6.0  HGB 12.0* 11.4* 12.2*  HCT 36.4* 35.0* 36.5*  MCV 79.8 82.0 78.3  PLT 257 268 276   Cardiac Enzymes:  Recent Labs Lab 10/03/14 2134 10/04/14 0315 10/04/14 0646  TROPONINI <0.30 <0.30 <0.30   BNP: BNP (last 3 results)  Recent Labs  02/14/14 1145 08/25/14 0430 10/03/14 1100  PROBNP 1322.0* 243.9* 222.4*   CBG:  Recent Labs Lab 10/04/14 1151  GLUCAP 100*       Signed:  Keivon Garden  [facility] 10/05/2014, 1:20 PM

## 2014-10-05 NOTE — Progress Notes (Signed)
Clinical Social Work Department BRIEF PSYCHOSOCIAL ASSESSMENT 10/05/2014  Patient:  Anthony Mcclure, Anthony Mcclure     Account Number:  [id number]     Admit date:  10/03/2014  Clinical Social Worker:  Adair Laundry  Date/Time:  10/05/2014 12:25 PM  Referred by:  Physician  Date Referred:  10/05/2014 Referred for  SNF Placement   Other Referral:   Interview type:  Family Other interview type:   Spoke with pt cousin over the phone    PSYCHOSOCIAL DATA Living Status:  FACILITY Admitted from facility:  [facility] Level of care:  [place] Primary support name:  Anthony Mcclure Primary support relationship to patient:  FAMILY Degree of support available:   Pt has good support    CURRENT CONCERNS Current Concerns  Post-Acute Placement   Other Concerns:    SOCIAL WORK ASSESSMENT / PLAN CSW received call back from [place] and she confirmed pt was admitted from [facility]. She informed CSW that she is not pt HCPOA but she is the only family member in the area so she assists with pt care. She reported no issues with other family members and confirmed they were all on the same page. Plan will be for pt to return to [facility]. CSW confirmed that pt should be set up with non-emergent ambulance. Pt cousin hoping to be able to come visit pt today but she has not been feeling well so is unsure if she will make it. CSW notified that CSW is unaware of dc plan, but MD note from yesterday indicated potential for dc back to SNF today. She is asking to be contacted when pt is ready for dc.   Assessment/plan status:  Psychosocial Support/Ongoing Assessment of Needs Other assessment/ plan:   Information/referral to community resources:   None needed    PATIENT'S/FAMILY'S RESPONSE TO PLAN OF CARE: pt family agreeable to pt returning to [facility]       [place], [place]

## 2014-10-05 NOTE — Progress Notes (Signed)
Report was called to [facility]

## 2014-10-06 LAB — URINE CULTURE

## 2014-10-08 ENCOUNTER — Non-Acute Institutional Stay (SKILLED_NURSING_FACILITY): Payer: Medicare Other | Admitting: Internal Medicine

## 2014-10-08 DIAGNOSIS — Y95 Nosocomial condition: Principal | ICD-10-CM

## 2014-10-08 DIAGNOSIS — J189 Pneumonia, unspecified organism: Secondary | ICD-10-CM

## 2014-10-08 DIAGNOSIS — E46 Unspecified protein-calorie malnutrition: Secondary | ICD-10-CM

## 2014-10-08 DIAGNOSIS — I10 Essential (primary) hypertension: Secondary | ICD-10-CM

## 2014-10-08 DIAGNOSIS — I4892 Unspecified atrial flutter: Secondary | ICD-10-CM

## 2014-10-09 ENCOUNTER — Non-Acute Institutional Stay (SKILLED_NURSING_FACILITY): Payer: Medicare Other | Admitting: Internal Medicine

## 2014-10-09 DIAGNOSIS — M898X9 Other specified disorders of bone, unspecified site: Secondary | ICD-10-CM

## 2014-10-09 DIAGNOSIS — R001 Bradycardia, unspecified: Secondary | ICD-10-CM

## 2014-10-09 DIAGNOSIS — G909 Disorder of the autonomic nervous system, unspecified: Secondary | ICD-10-CM

## 2014-10-09 LAB — CULTURE, BLOOD (ROUTINE X 2)
CULTURE: NO GROWTH
Culture: NO GROWTH
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
## 2014-10-10 NOTE — Progress Notes (Signed)
Patient ID: Anthony Mcclure, male   DOB: 11/15/1951, 63 y.o.   MRN: [id number]                PROGRESS NOTE  DATE: 10/03/2014       FACILITY: [facility]  LEVEL OF CARE: SNF (31)  Acute Visit      ALLERGIES:  No Known Allergies  CHIEF COMPLAINT:  Manage chest pain and shortness of breath.    HISTORY OF PRESENT ILLNESS:  Staff report that patient is acutely complaining of chest pain and shortness of breath.  Patient overall is a poor historian.    PAST MEDICAL HISTORY :  Past Medical History  Diagnosis Date  . CHF (congestive heart failure)   . Alcohol abuse   . Seizure   . Noncompliance with medication treatment due to intermittent use of medication 02/02/2012  . HTN (hypertension) 02/02/2012  . Alcohol withdrawal seizure 02/02/2012  . Hypertension   . Dysrhythmia     paroxismal atrial fib  . Paroxysmal Atrial Flutter 02/01/2012  . Hepatitis C 02/02/2012    Unsure if he was ever diagnosed with Hep C  . Hepatitis C   . Quadriplegia     PAST SURGICAL HISTORY: Past Surgical History  Procedure Laterality Date  . Forearm reconstruction      SOCIAL HISTORY:  reports that he has been smoking.  He has never used smokeless tobacco. He reports that he drinks alcohol. He reports that he uses illicit drugs ("Crack" cocaine).  FAMILY HISTORY:  Family History  Problem Relation Age of Onset  . Heart disease Father   . Alcohol abuse Father   . Alcohol abuse Brother   . Diabetes type II Mother     CURRENT MEDICATIONS: Reviewed per MAR/see medication list  REVIEW OF SYSTEMS:  Difficult to obtain.  Patient is a poor historian.     PHYSICAL EXAMINATION  VS:  See VS section  GENERAL: no acute distress, normal body habitus EYES: conjunctivae normal, sclerae normal, normal eye lids MOUTH/THROAT: lips without lesions,no lesions in the mouth,tongue is without lesions,uvula elevates in midline NECK: supple, trachea midline, no neck masses, no thyroid tenderness, no  thyromegaly LYMPHATICS: no LAN in the neck, no supraclavicular LAN RESPIRATORY: diminished breath sounds, breathing is even & unlabored CARDIAC: bradycardic, no murmur,no extra heart sounds, no edema GI:  ABDOMEN: abdomen soft, normal BS, no masses, no tenderness  LIVER/SPLEEN: no hepatomegaly, no splenomegaly PSYCHIATRIC: the patient is alert & oriented to person, affect & behavior appropriate  LABS/RADIOLOGY:  Labs reviewed: Basic Metabolic Panel:  Recent Labs  08/19/14 0441  08/22/14 0635 08/23/14 0650  10/03/14 1100 10/04/14 0646 10/05/14 0630  NA 141  < > 148* 149*  < > 140 137 133*  K 4.2  < > 3.6* 3.7  < > 4.7 4.2 4.3  CL 108  < > 109 110  < > 102 101 98  CO2 22  < > 29 30  < > 27 25 23   GLUCOSE 153*  < > 133* 129*  < > 101* 90 101*  BUN 16  < > 19 24*  < > 13 10 9   CREATININE 0.92  < > 0.81 0.93  < > 0.54 0.58 0.53  CALCIUM 7.5*  < > 8.8 8.4  < > 9.0 8.8 8.8  MG 1.5  < > 1.9 2.1  --   --   --  1.6  PHOS 3.1  --  3.3 2.8  --   --   --   --   < > =  values in this interval not displayed. Liver Function Tests:  Recent Labs  08/20/14 0541 08/25/14 0430 10/05/14 0630  AST 85* 125* 61*  ALT 84* 147* 47  ALKPHOS 62 53 65  BILITOT 1.2 0.6 0.7  PROT 6.4 6.3 6.4  ALBUMIN 2.7* 2.4* 1.9*   CBC:  Recent Labs  09/25/14 2051 10/03/14 1156 10/04/14 0646 10/05/14 0630  WBC 11.5* 12.5* 10.1 8.7  NEUTROABS 8.3* 8.9*  --  6.0  HGB 13.0 12.0* 11.4* 12.2*  HCT 39.0 36.4* 35.0* 36.5*  MCV 80.1 79.8 82.0 78.3  PLT 188 257 268 276   Lipid Panel:  Recent Labs  08/21/14 0300  HDL 63   Cardiac Enzymes:  Recent Labs  01/19/14 0018  10/03/14 2134 10/04/14 0315 10/04/14 0646  CKTOTAL 1064*  --   --   --   --   TROPONINI  --   < > <0.30 <0.30 <0.30  < > = values in this interval not displayed.  CBG:  Recent Labs  08/29/14 0007 08/29/14 0522 10/04/14 1151  GLUCAP 117* 114* 100*    ASSESSMENT/PLAN:    Chest pain and shortness of breath.  New onset.   Significant problem.  Patient is unstable.  He is tachypneic and bradycardic.   Therefore, we will send to the emergency room for immediate evaluation and management.  He will need CBC with diff, CMP, EKG, chest x-ray, cardiac enzymes, and a D-dimer.    I have reviewed patient's medical records received at admission/from hospitalization.  CPT CODE: 26378            Anthony Mcclure, [place] [phone]

## 2014-10-19 ENCOUNTER — Inpatient Hospital Stay: Payer: Self-pay | Admitting: Internal Medicine

## 2014-10-24 ENCOUNTER — Encounter (HOSPITAL_COMMUNITY): Payer: Self-pay | Admitting: *Deleted

## 2014-10-24 ENCOUNTER — Emergency Department (HOSPITAL_COMMUNITY): Payer: Medicare Other

## 2014-10-24 ENCOUNTER — Emergency Department (HOSPITAL_COMMUNITY)
Admission: EM | Admit: 2014-10-24 | Discharge: 2014-11-13 | Disposition: E | Payer: Medicare Other | Attending: Emergency Medicine | Admitting: Emergency Medicine

## 2014-10-24 DIAGNOSIS — I469 Cardiac arrest, cause unspecified: Secondary | ICD-10-CM

## 2014-10-24 DIAGNOSIS — Z8619 Personal history of other infectious and parasitic diseases: Secondary | ICD-10-CM | POA: Insufficient documentation

## 2014-10-24 DIAGNOSIS — E872 Acidosis, unspecified: Secondary | ICD-10-CM

## 2014-10-24 DIAGNOSIS — N179 Acute kidney failure, unspecified: Secondary | ICD-10-CM | POA: Diagnosis not present

## 2014-10-24 DIAGNOSIS — Z7982 Long term (current) use of aspirin: Secondary | ICD-10-CM | POA: Diagnosis not present

## 2014-10-24 DIAGNOSIS — Z79899 Other long term (current) drug therapy: Secondary | ICD-10-CM | POA: Diagnosis not present

## 2014-10-24 DIAGNOSIS — I10 Essential (primary) hypertension: Secondary | ICD-10-CM | POA: Diagnosis not present

## 2014-10-24 DIAGNOSIS — Z720 Tobacco use: Secondary | ICD-10-CM | POA: Insufficient documentation

## 2014-10-24 DIAGNOSIS — G8250 Quadriplegia, unspecified: Secondary | ICD-10-CM | POA: Diagnosis not present

## 2014-10-24 DIAGNOSIS — R4182 Altered mental status, unspecified: Secondary | ICD-10-CM | POA: Insufficient documentation

## 2014-10-24 DIAGNOSIS — R4020 Unspecified coma: Secondary | ICD-10-CM | POA: Diagnosis present

## 2014-10-24 LAB — COMPREHENSIVE METABOLIC PANEL
ALT: 59 U/L — ABNORMAL HIGH (ref 0–53)
AST: 94 U/L — ABNORMAL HIGH (ref 0–37)
Albumin: 1.9 g/dL — ABNORMAL LOW (ref 3.5–5.2)
Alkaline Phosphatase: 112 U/L (ref 39–117)
Anion gap: 23 — ABNORMAL HIGH (ref 5–15)
BUN: 34 mg/dL — ABNORMAL HIGH (ref 6–23)
CALCIUM: 9 mg/dL (ref 8.4–10.5)
CO2: 17 meq/L — AB (ref 19–32)
Chloride: 96 mEq/L (ref 96–112)
Creatinine, Ser: 1.78 mg/dL — ABNORMAL HIGH (ref 0.50–1.35)
GFR calc Af Amer: 45 mL/min — ABNORMAL LOW (ref >90)
GFR, EST NON AFRICAN AMERICAN: 39 mL/min — AB (ref >90)
Glucose, Bld: 141 mg/dL — ABNORMAL HIGH (ref 70–99)
Potassium: 6.1 mEq/L — ABNORMAL HIGH (ref 3.7–5.3)
Sodium: 136 mEq/L — ABNORMAL LOW (ref 137–147)
Total Bilirubin: 0.6 mg/dL (ref 0.3–1.2)
Total Protein: 7.1 g/dL (ref 6.0–8.3)

## 2014-10-24 LAB — CBC WITH DIFFERENTIAL/PLATELET
Basophils Absolute: 0 10*3/uL (ref 0.0–0.1)
Basophils Relative: 0 % (ref 0–1)
EOS PCT: 0 % (ref 0–5)
Eosinophils Absolute: 0 10*3/uL (ref 0.0–0.7)
HCT: 32.9 % — ABNORMAL LOW (ref 39.0–52.0)
Hemoglobin: 10.7 g/dL — ABNORMAL LOW (ref 13.0–17.0)
LYMPHS ABS: 2.5 10*3/uL (ref 0.7–4.0)
LYMPHS PCT: 10 % — AB (ref 12–46)
MCH: 25.7 pg — ABNORMAL LOW (ref 26.0–34.0)
MCHC: 32.5 g/dL (ref 30.0–36.0)
MCV: 79.1 fL (ref 78.0–100.0)
MONOS PCT: 6 % (ref 3–12)
Monocytes Absolute: 1.4 10*3/uL — ABNORMAL HIGH (ref 0.1–1.0)
Neutro Abs: 20.7 10*3/uL — ABNORMAL HIGH (ref 1.7–7.7)
Neutrophils Relative %: 84 % — ABNORMAL HIGH (ref 43–77)
Platelets: 385 10*3/uL (ref 150–400)
RBC: 4.16 MIL/uL — AB (ref 4.22–5.81)
RDW: 16.8 % — AB (ref 11.5–15.5)
WBC: 24.7 10*3/uL — ABNORMAL HIGH (ref 4.0–10.5)

## 2014-10-24 LAB — TROPONIN I: Troponin I: 0.87 ng/mL (ref <0.30)

## 2014-10-24 MED ORDER — SODIUM CHLORIDE 0.9 % IV BOLUS (SEPSIS): 1000.0000 mL | Freq: Once | INTRAVENOUS | Status: DC

## 2014-10-27 LAB — CULTURE, BLOOD (ROUTINE X 2)

## 2014-10-31 LAB — CULTURE, BLOOD (ROUTINE X 2): CULTURE: NO GROWTH

## 2014-11-13 NOTE — Progress Notes (Signed)
Met pt's son and son's girlfriend in lobby; escorted them to consult rm A where Dr. Roxanne Mins explained how the pt declined and passed. Pt's son was appropriately upset and tearful. Offered emotional spt and escorted him to visit pt remains. Pt's cousin arrived later to visit and is the primary contact person. Remained w/family throughout visit and gave contact and funeral home information to pt nurse. Ernest Haber Chaplain   11-21-14 2200  Clinical Encounter Type  Visited With Family

## 2014-11-13 NOTE — ED Provider Notes (Signed)
63 year old male with history of quadriplegia was sent from nursing home where he was noted to not be responding to staff over the previous hour. He normally is conversant and will laugh. EMS reports thready pulses and inability to get a blood pressure. On exam, he is taking preagonal respirations. Breath sounds are somewhat distant but are symmetric without rales. Heart tones are very distant. Abdomen is soft and nontender. He has a 3+ pedal edema and is quadriplegic. He does not respond to voice or painful stimuli. He is warm to touch but does not have an objective fever. He is DO NOT RESUSCITATE and I have talked with family who confirmed DO NOT RESUSCITATE/DO NOT INTUBATE status. Workup was initiated for possible sepsis. IV access was obtained via Dr. Lovena Le using ultrasound guidance. This was done under my direct supervision and I was present for the entire procedure.  After workup was initiated and resuscitation initiated, patient became apneic and monitor showed flat line and he was pronounced dead. Family has arrived and have been informed of his tests.  I saw and evaluated the patient, reviewed the resident's note and I agree with the findings and plan.   EKG Interpretation   Date/Time:  11-17-14 20:23:27 EST Ventricular Rate:  97 PR Interval:  168 QRS Duration: 147 QT Interval:  378 QTC Calculation: 480 R Axis:   114 Text Interpretation:  Sinus rhythm RBBB and LPFB When compared with ECG of  10/04/2014, Right bundle branch block and Left posterior fasicular block  are now Present Confirmed by [facility]  MD, Cailean Heacock ([id number]) on 17-Nov-2014  8:40:15 PM      CRITICAL CARE Performed by: YQMVH,QIONG Total critical care time: 45 minutes Critical care time was exclusive of separately billable procedures and treating other patients. Critical care was necessary to treat or prevent imminent or life-threatening deterioration. Critical care was time spent personally by me on the  following activities: development of treatment plan with patient and/or surrogate as well as nursing, discussions with consultants, evaluation of patient's response to treatment, examination of patient, obtaining history from patient or surrogate, ordering and performing treatments and interventions, ordering and review of laboratory studies, ordering and review of radiographic studies, pulse oximetry and re-evaluation of patient's condition.   Delora Fuel, MD 29/52/84 1324

## 2014-11-13 NOTE — ED Provider Notes (Signed)
CSN: [id number]     Arrival date & time 11/12/2014  2020 History   First MD Initiated Contact with Patient 2014/11/12 2023     Chief Complaint  Patient presents with  . Unresponsive      (Consider location/radiation/quality/duration/timing/severity/associated sxs/prior Treatment) HPI Comments: Level 5 caveat  Patient is a 63 y.o. male presenting with altered mental status. The history is provided by the EMS personnel and the nursing home. No language interpreter was used.  Altered Mental Status Presenting symptoms: lethargy and partial responsiveness   Severity:  Severe Most recent episode:  Today Episode history:  Single Duration:  1 hour Timing:  Constant Progression:  Unchanged Chronicity:  New Context: recent illness     Past Medical History  Diagnosis Date  . CHF (congestive heart failure)   . Alcohol abuse   . Seizure   . Noncompliance with medication treatment due to intermittent use of medication 02/02/2012  . HTN (hypertension) 02/02/2012  . Alcohol withdrawal seizure 02/02/2012  . Hypertension   . Dysrhythmia     paroxismal atrial fib  . Paroxysmal Atrial Flutter 02/01/2012  . Hepatitis C 02/02/2012    Unsure if he was ever diagnosed with Hep C  . Hepatitis C   . Quadriplegia    Past Surgical History  Procedure Laterality Date  . Forearm reconstruction     Family History  Problem Relation Age of Onset  . Heart disease Father   . Alcohol abuse Father   . Alcohol abuse Brother   . Diabetes type II Mother    History  Substance Use Topics  . Smoking status: Light Tobacco Smoker  . Smokeless tobacco: Never Used  . Alcohol Use: Yes     Comment: statrts drinking everyday at 5am drinks about 2 fiths been drinking for about 10 years    Review of Systems  Unable to perform ROS: Mental status change      Allergies  Review of patient's allergies indicates no known allergies.  Home Medications   Prior to Admission medications   Medication Sig Start Date  End Date Taking? Authorizing Provider  ALPRAZolam Duanne Moron) 1 MG tablet Take 1 tablet (1 mg total) by mouth 3 (three) times daily as needed for anxiety or sleep (agitation). 10/05/14  Yes Kelvin Cellar, MD  Amino Acids-Protein Hydrolys (FEEDING SUPPLEMENT, PRO-STAT SUGAR FREE 64,) LIQD Take 30 mLs by mouth 2 (two) times daily.   Yes Historical Provider, MD  aspirin 325 MG tablet Take 1 tablet (325 mg total) by mouth daily. 08/31/14  Yes Belkys A Regalado, MD  chlorhexidine (PERIDEX) 0.12 % solution 15 mLs by Mouth Rinse route 2 (two) times daily. 08/31/14  Yes Belkys A Regalado, MD  diltiazem (CARDIZEM CD) 120 MG 24 hr capsule Take 1 capsule (120 mg total) by mouth daily. 10/05/14  Yes Kelvin Cellar, MD  folic acid (FOLVITE) 1 MG tablet Take 1 mg by mouth daily.   Yes Historical Provider, MD  metoprolol tartrate (LOPRESSOR) 25 MG tablet Take 25 mg by mouth 2 (two) times daily.   Yes Historical Provider, MD  Multiple Vitamins-Minerals (MULTIVITAMIN WITH MINERALS) tablet Take 1 tablet by mouth daily.   Yes Historical Provider, MD  senna (SENOKOT) 8.6 MG TABS tablet Take 1 tablet (8.6 mg total) by mouth at bedtime. 08/31/14  Yes Belkys A Regalado, MD  thiamine (VITAMIN B-1) 100 MG tablet Take 100 mg by mouth daily.   Yes Historical Provider, MD   BP 138/13 mmHg  Pulse 109  Resp 33  Wt 189 lb (85.73 kg)  SpO2 100% Physical Exam  Constitutional: He appears lethargic. He appears cachectic.  HENT:  Head: Normocephalic and atraumatic.  Eyes: Pupils are equal, round, and reactive to light.  Neck: No JVD present.  Cardiovascular: Regular rhythm and intact distal pulses.  Tachycardia present.   Pulmonary/Chest: Effort normal and breath sounds normal.  Abdominal: Soft. He exhibits no distension.  Musculoskeletal: He exhibits no edema.  Neurological: He appears lethargic. GCS eye subscore is 4. GCS verbal subscore is 1. GCS motor subscore is 4.  Vitals reviewed.   ED Course  Angiocath  insertion Date/Time: 10-30-2014 9:12 PM Performed by: Amparo Bristol Authorized by: Amparo Bristol Consent: The procedure was performed in an emergent situation. Patient identity confirmed: arm band Preparation: Patient was prepped and draped in the usual sterile fashion. Local anesthesia used: no Patient sedated: no Patient tolerance: Patient tolerated the procedure well with no immediate complications Comments: US guided 20 g placed in left AC   (including critical care time) Labs Review Labs Reviewed  CBC WITH DIFFERENTIAL - Abnormal; Notable for the following:    WBC 24.7 (*)    RBC 4.16 (*)    Hemoglobin 10.7 (*)    HCT 32.9 (*)    MCH 25.7 (*)    RDW 16.8 (*)    Neutrophils Relative % 84 (*)    Neutro Abs 20.7 (*)    Lymphocytes Relative 10 (*)    Monocytes Absolute 1.4 (*)    All other components within normal limits  CULTURE, BLOOD (ROUTINE X 2)  CULTURE, BLOOD (ROUTINE X 2)  URINE CULTURE  COMPREHENSIVE METABOLIC PANEL  TROPONIN I  URINALYSIS, ROUTINE W REFLEX MICROSCOPIC  I-STAT VENOUS BLOOD GAS, ED    Imaging Review Dg Chest Port 1 View  Oct 30, 2014   CLINICAL DATA:  Sepsis.  EXAM: PORTABLE CHEST - 1 VIEW  COMPARISON:  10/03/2014  FINDINGS: Patient slightly rotated to the left. Lungs are adequately inflated without consolidation or effusion. There is mild stable cardiomegaly. There are degenerative changes of the spine.  IMPRESSION: No active disease.  Stable cardiomegaly.   Electronically Signed   By: Marin Olp M.D.   On: 2014-10-30 21:07     EKG Interpretation   Date/Time:  10-30-2014 20:23:27 EST Ventricular Rate:  97 PR Interval:  168 QRS Duration: 147 QT Interval:  378 QTC Calculation: 480 R Axis:   114 Text Interpretation:  Sinus rhythm RBBB and LPFB When compared with ECG of  10/04/2014, Right bundle branch block and Left posterior fasicular block  are now Present Confirmed by [facility]  MD, DAVID ([id number]) on Oct 30, 2014  8:40:15  PM      MDM   Final diagnoses:  None    63 y/o male with h/o quadriplegia from nursing home due to change in mental status. Unresponsive to staff x 1 hr. Normally conversant. EMS reporting thready pulse and unable to obtain BP. Pressure here 138/13 on best attempt, intact pulses. Breath sounds equal. Pt feels warm to touch. No response to painful stimuli. Suspect sepsis. DNR present with pt. Work up and resuscitation initiated. Pt became apneic and asystole on monitor. Time of death 21:24. Family arrived and notified with chaplain available. Questions answered. Pt pcp contacted for death certificate completion.     Amparo Bristol, MD 10/25/14 [id number]

## 2014-11-13 NOTE — ED Notes (Signed)
Patient cleaned up of large amount of tan colored stool.  Wound dressing to the sacrum and left mid back.

## 2014-11-13 NOTE — ED Notes (Addendum)
Patient presents from [facility] [phone]) Paperwork and yellow DNR present Staff reported to EMS that 1 hour PTA he was unresponsive.  Staff was unable to obtain a BP, CBG 123.  Staff reported they were unable to arouse him to eat dinner.  This nurse called Mendel Corning to obtain information. Staff stated he did his physical therapy this afternoon and was placed back in the bed at 4pm.   EMS reported they were unable to obtain a manual BP, HR 120 and irregular and unable to obtain IV access (both EJ's)  Patient arrived with indwelling foley

## 2014-11-13 NOTE — ED Notes (Signed)
Patient transported to the morgue.

## 2014-11-13 NOTE — ED Notes (Signed)
Trop 0.87 per lab

## 2014-11-13 NOTE — ED Notes (Signed)
No respirations noted, no heart beat

## 2014-11-13 DEATH — deceased

## 2016-01-19 IMAGING — CR DG CHEST 1V PORT
1 series · 1 of 1 positions shown · non-contrast
Comparison: Portable chest x-ray August 16, 2014

CLINICAL DATA: Reassess support tube placement.

EXAM:
PORTABLE CHEST - 1 VIEW

[AP]
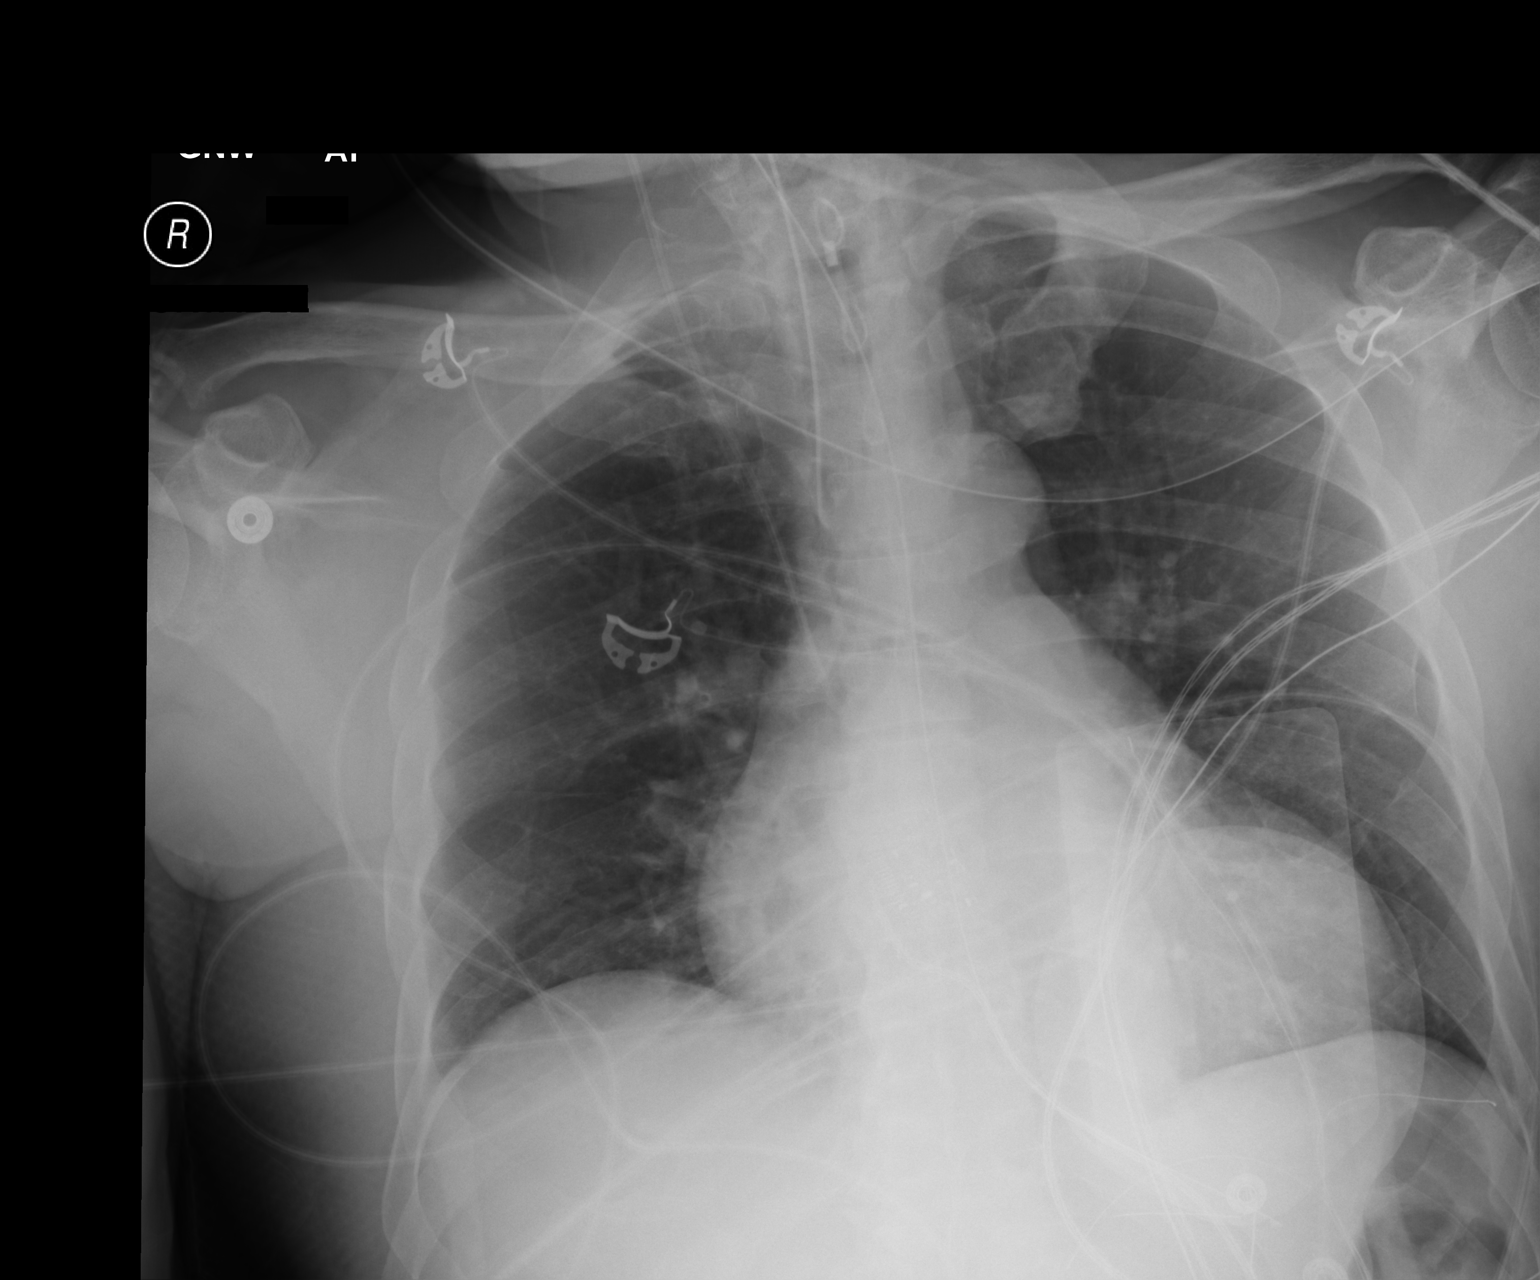

[1 of 1 positions shown; findings below may reference images not displayed]

FINDINGS: The lungs are reasonably well inflated and clear. The cardiac
silhouette is mildly enlarged though stable. A external pacing
-defibrillator pads are present. The pulmonary vascularity is not
engorged. The endotracheal tube tip lies 3.9 cm above the crotch of
the carina. The esophagogastric tube tip lies in the gastric cardia.
The proximal port likely lies at or above the GE junction. The right
internal jugular venous catheter tip lies in the midportion of the
SVC.
IMPRESSION: 1. There is no evidence of pneumonia nor pulmonary edema.
2. The endotracheal tube and internal jugular catheter are in
appropriate position. Advancement of the nasogastric tube by 5-10 cm
is recommended to assure that the proximal port is below the GE
junction.

## 2016-01-31 IMAGING — MR MR CERVICAL SPINE WO/W CM
5 of 11 series · 18 of 48 positions shown · IV contrast (20    MULTIHANCE)
Comparison: MRI of the cervical spine without and with contrast
08/19/2014.

CLINICAL DATA: Cervical spine trauma versus infarct. Unable to move
his arms. The patient has mobility of his lower extremity.

EXAM:
MRI CERVICAL SPINE WITHOUT AND WITH CONTRAST
TECHNIQUE: Multiplanar and multiecho pulse sequences of the cervical spine, to
include the craniocervical junction and cervicothoracic junction,
were obtained according to standard protocol without and with
intravenous contrast.
CONTRAST:  20mL MULTIHANCE GADOBENATE DIMEGLUMINE 529 MG/ML IV SOLN

[Series 9: T2 · axial · 4.3mm · 0.35mm/px · z∈[+3,+165]mm · 7 of 34 slices shown (1 of 2)]
[im 1/34]
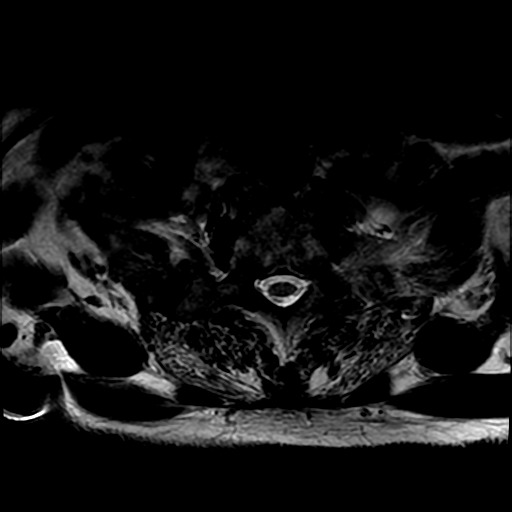
[im 6/34]
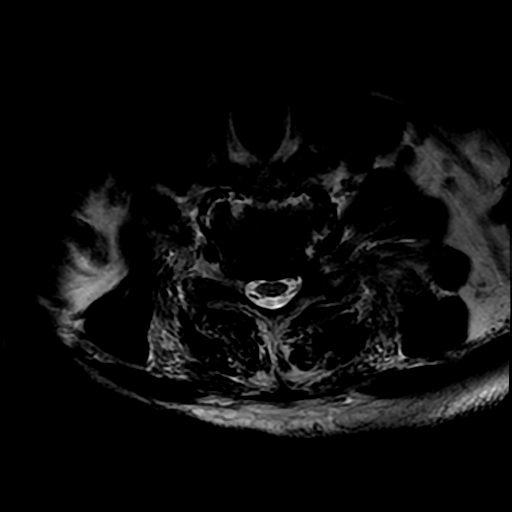
[im 12/34]
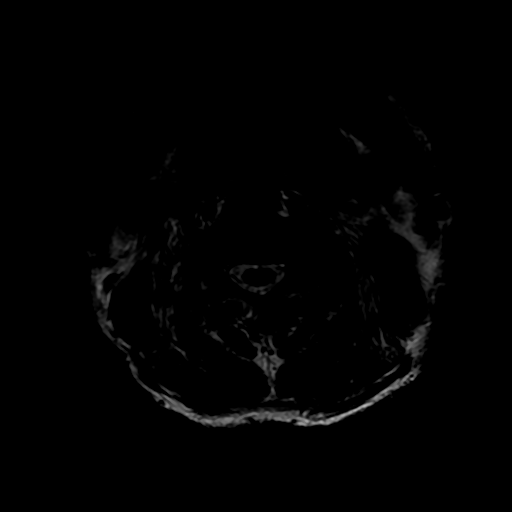
[im 17/34]
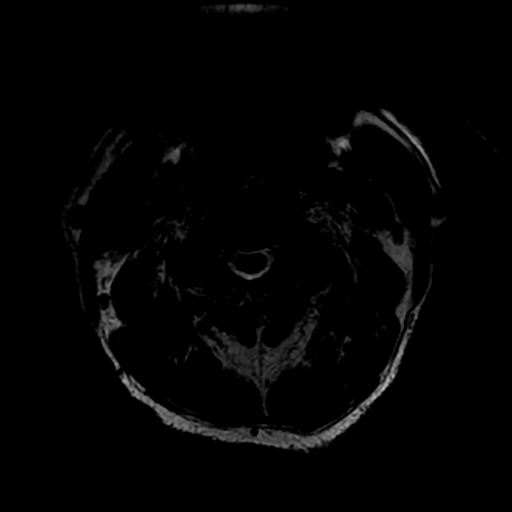
[im 23/34]
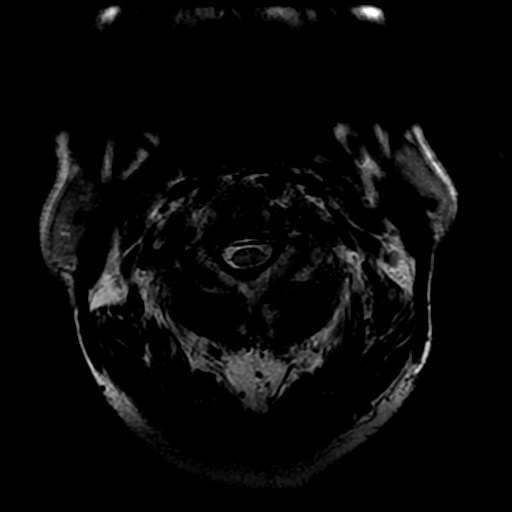
[im 28/34]
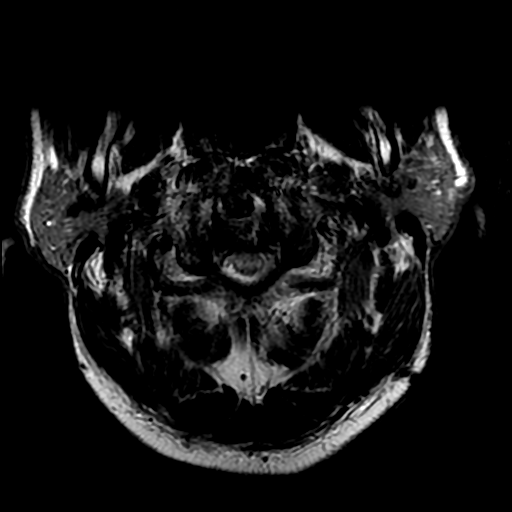
[im 34/34]
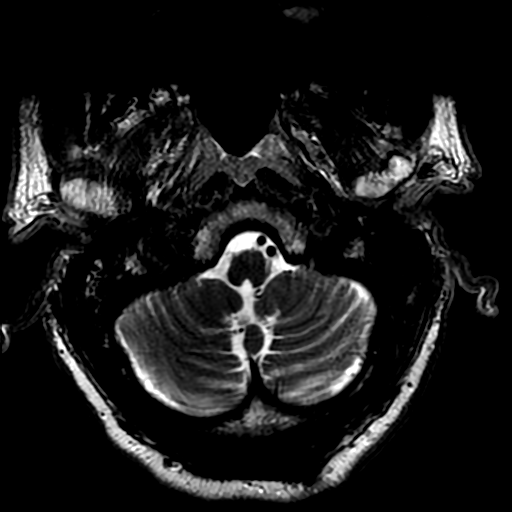

[Series 11: T1 · axial · 4.3mm · 0.35mm/px · z∈[+3,+165]mm · 6 of 34 slices shown]
[im 1/34]
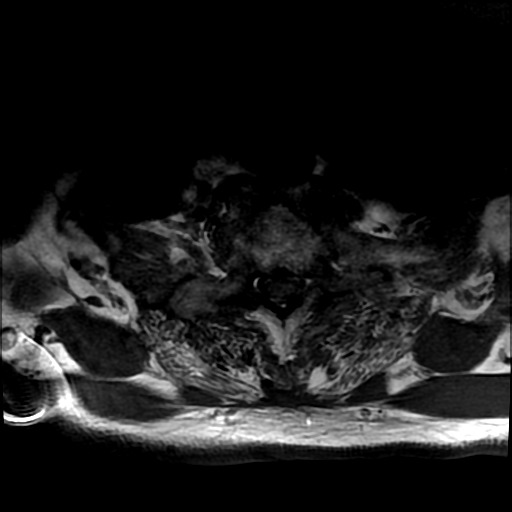
[im 7/34]
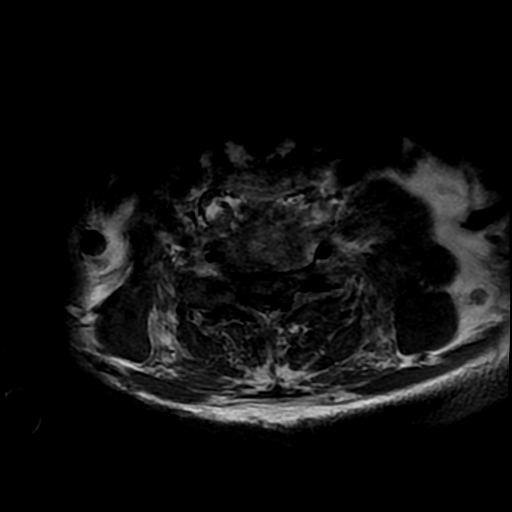
[im 14/34]
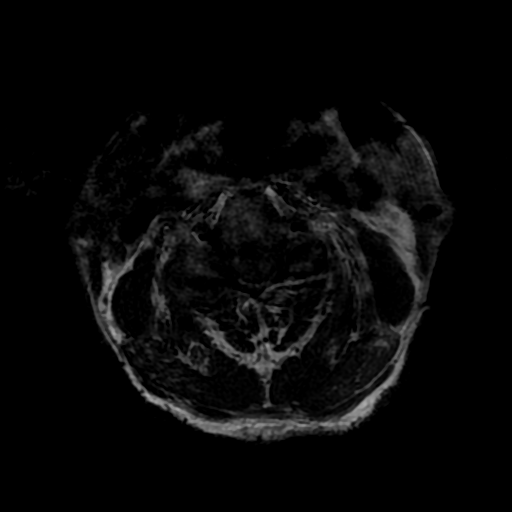
[im 20/34]
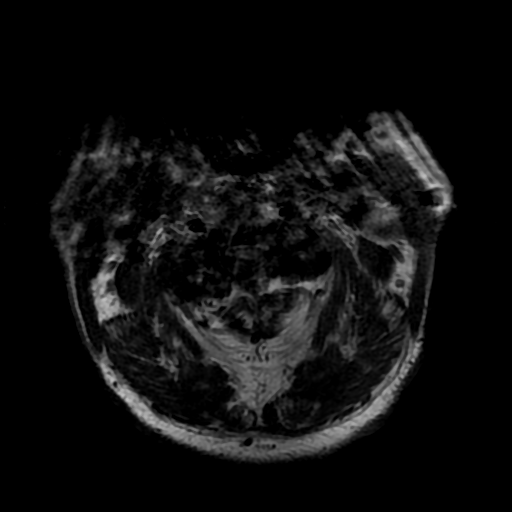
[im 27/34]
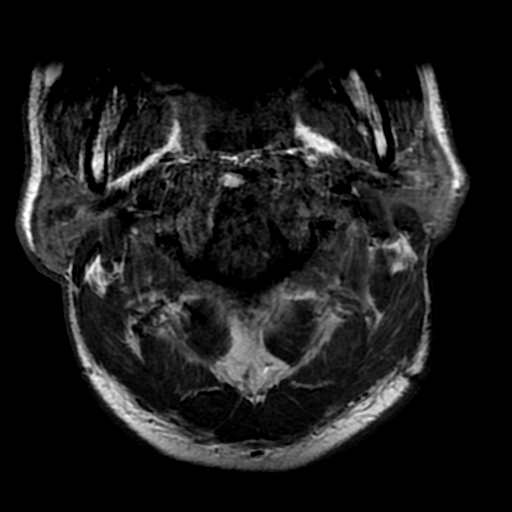
[im 34/34]
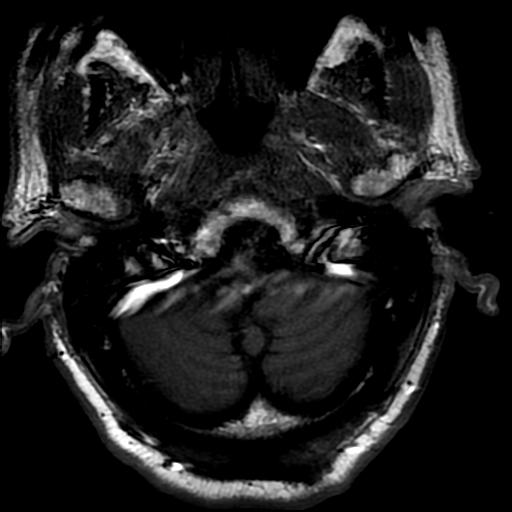

[Series 14: T1 post-contrast · axial · 4.3mm · 0.35mm/px · 1 of 34 slices shown]
[im 1/34]
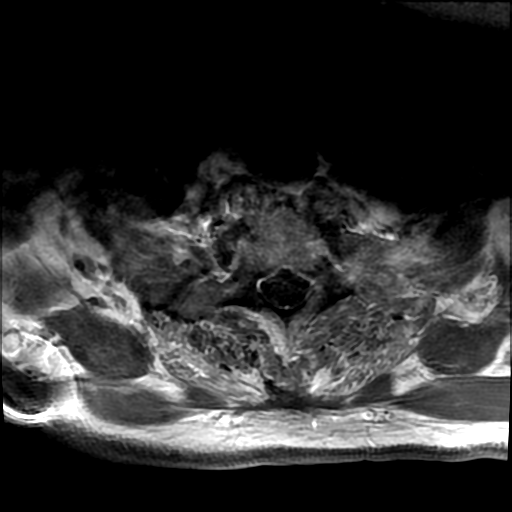

[Series 300: T2 · sagittal · 3.0mm · 0.39mm/px · 2 of 13 slices shown (2 of 2)]
[im 1/13]
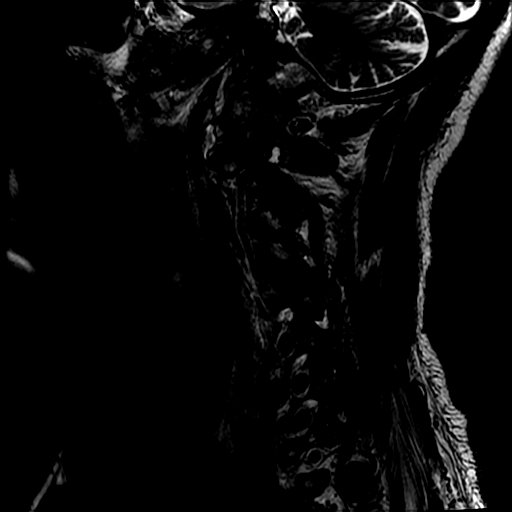
[im 13/13]
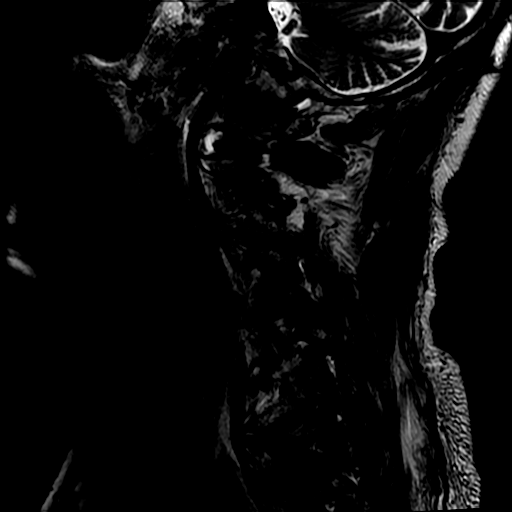

[Series 500: STIR · sagittal · 3.0mm · 0.39mm/px · 2 of 13 slices shown]
[im 1/13]
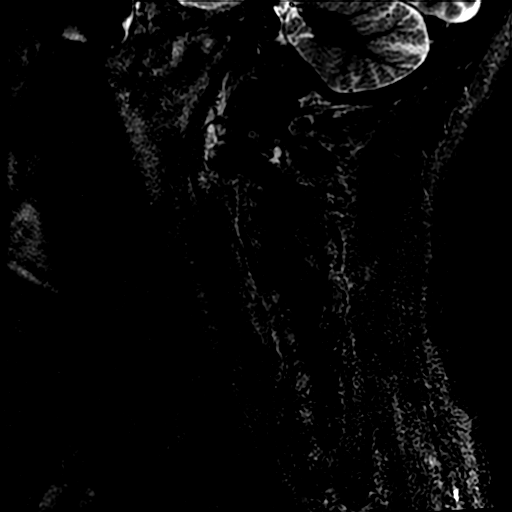
[im 13/13]
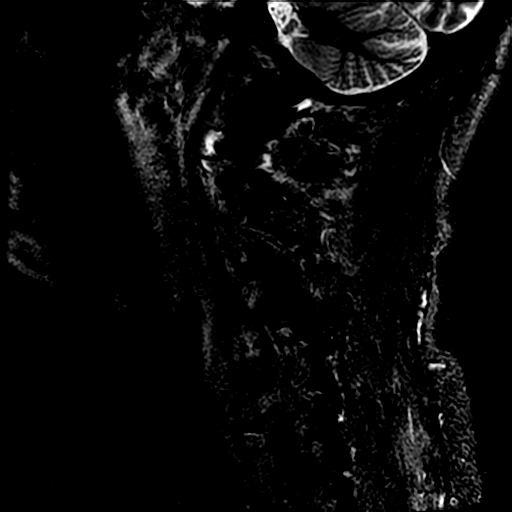

[18 of 48 positions shown; findings below may reference images not displayed]

FINDINGS: Marked pannus is again noted posterior to C1-2 with narrowing of the
dens, compatible with rheumatoid arthritis. This results in focal
narrowing of the spinal canal.

Diffuse cord signal abnormality is again noted within the cord at
this level. The cephalo caudad extent of cord signal abnormality has
diminished. Diffusion-weighted imaging was done through this areas
well without evidence for restricted diffusion.

Postcontrast imaging demonstrates no focal enhancement of the cord.
There is dural enhancement is previously-seen. There is also
enhancement within the dens. There is also some enhancement of the
posterior pannus.

Multilevel degenerative changes of the cervical spine are otherwise
stable. Chronic marrow signal changes at C7-T1 are stable. No other
focal marrow signal abnormality is present.
IMPRESSION: 1. Decreased cephalo caudad extent of T2 signal abnormality at the
C1-2 level. There is no diffusion abnormality. The findings are most
compatible with acute trauma. Myelomalacia is considered less likely
given normal overall size of the cord.
2. Abnormal marrow signal and enhancement within the dens. This may
be related to the arthritic changes. Posttraumatic marrow changes
are also considered. There is no definite displaced fracture.
3. Multilevel spondylosis of the cervical spine is otherwise stable.

## 2016-03-06 IMAGING — CR DG CHEST 1V PORT
1 series · 1 of 1 positions shown · non-contrast
Comparison: Portable chest x-ray September 25, 2014

CLINICAL DATA: Chest pain, shortness of breath, sepsis ; history of
CHF and hepatitis

EXAM:
PORTABLE CHEST - 1 VIEW

[AP]
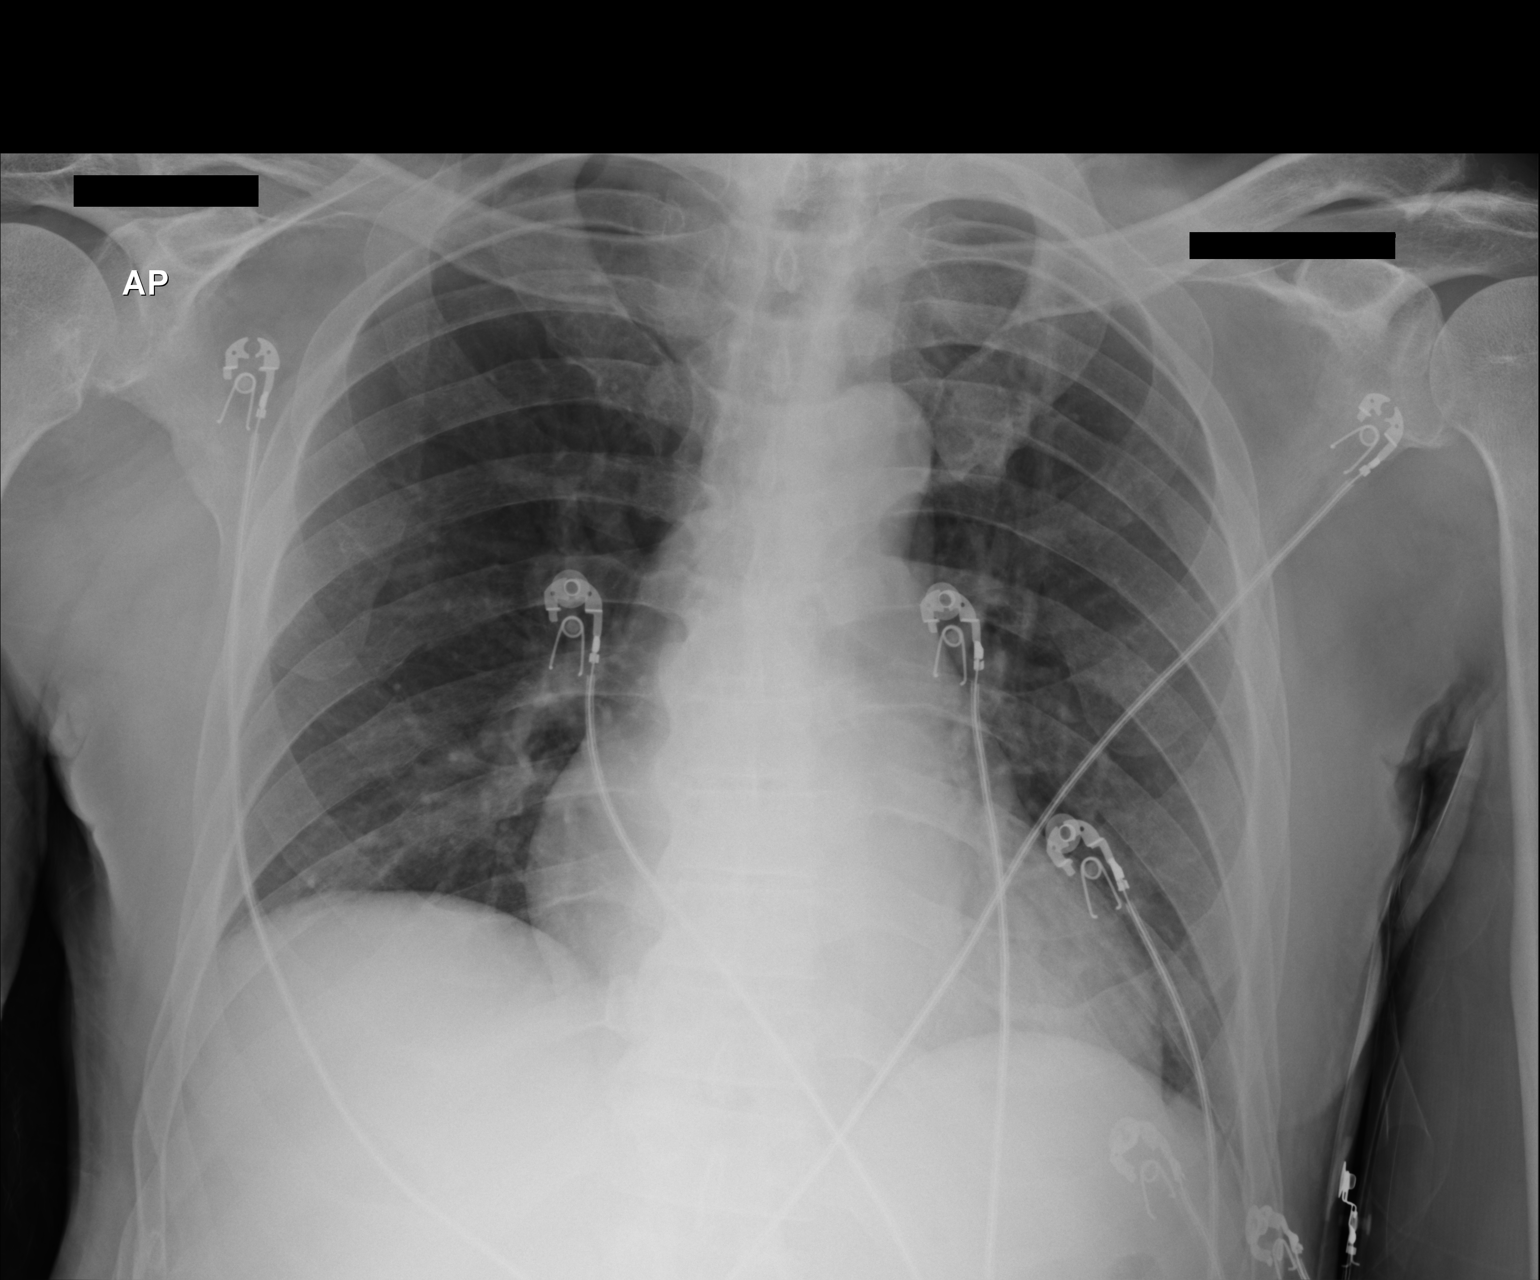

[1 of 1 positions shown; findings below may reference images not displayed]

FINDINGS: The lungs are adequately inflated and clear. The heart and pulmonary
vascularity are within the limits of normal. The mediastinum is
normal in width. There is no pleural effusion. The bony thorax is
unremarkable.
IMPRESSION: There is no acute cardiopulmonary abnormality.
